# Patient Record
Sex: Male | Born: 1955 | Race: White | Hispanic: No | Smoking: Never smoker
Health system: Southern US, Community
[De-identification: ages and names within clinical notes are randomized; demographics above are authoritative.]

## PROBLEM LIST (undated history)

## (undated) DIAGNOSIS — K219 Gastro-esophageal reflux disease without esophagitis: Secondary | ICD-10-CM

## (undated) DIAGNOSIS — E119 Type 2 diabetes mellitus without complications: Secondary | ICD-10-CM

## (undated) DIAGNOSIS — I7100 Dissection of unspecified site of aorta: Secondary | ICD-10-CM

## (undated) DIAGNOSIS — K4090 Unilateral inguinal hernia, without obstruction or gangrene, not specified as recurrent: Secondary | ICD-10-CM

## (undated) DIAGNOSIS — J189 Pneumonia, unspecified organism: Secondary | ICD-10-CM

## (undated) DIAGNOSIS — D649 Anemia, unspecified: Secondary | ICD-10-CM

## (undated) DIAGNOSIS — C641 Malignant neoplasm of right kidney, except renal pelvis: Secondary | ICD-10-CM

## (undated) DIAGNOSIS — K2270 Barrett's esophagus without dysplasia: Secondary | ICD-10-CM

## (undated) DIAGNOSIS — Z9889 Other specified postprocedural states: Secondary | ICD-10-CM

## (undated) DIAGNOSIS — K439 Ventral hernia without obstruction or gangrene: Secondary | ICD-10-CM

## (undated) DIAGNOSIS — K429 Umbilical hernia without obstruction or gangrene: Secondary | ICD-10-CM

## (undated) DIAGNOSIS — I10 Essential (primary) hypertension: Secondary | ICD-10-CM

## (undated) DIAGNOSIS — G4733 Obstructive sleep apnea (adult) (pediatric): Secondary | ICD-10-CM

## (undated) DIAGNOSIS — N2889 Other specified disorders of kidney and ureter: Secondary | ICD-10-CM

## (undated) DIAGNOSIS — M62838 Other muscle spasm: Secondary | ICD-10-CM

## (undated) DIAGNOSIS — Z8719 Personal history of other diseases of the digestive system: Secondary | ICD-10-CM

## (undated) DIAGNOSIS — N280 Ischemia and infarction of kidney: Secondary | ICD-10-CM

## (undated) HISTORY — DX: Essential (primary) hypertension: I10

## (undated) HISTORY — PX: COLONOSCOPY: SHX174

## (undated) HISTORY — PX: PICC LINE INSERTION: CATH118290

## (undated) HISTORY — PX: HERNIA REPAIR: SHX51

## (undated) HISTORY — PX: TRANSURETHRAL RESECTION OF PROSTATE: SHX73

## (undated) HISTORY — DX: Type 2 diabetes mellitus without complications: E11.9

## (undated) HISTORY — PX: UPPER GASTROINTESTINAL ENDOSCOPY: SHX188

## (undated) HISTORY — DX: Barrett's esophagus without dysplasia: K22.70

## (undated) HISTORY — PX: TONSILLECTOMY: SUR1361

---

## 2007-01-22 ENCOUNTER — Ambulatory Visit (HOSPITAL_COMMUNITY): Admission: RE | Admit: 2007-01-22 | Discharge: 2007-01-22 | Payer: Self-pay | Admitting: Anesthesiology

## 2009-10-29 ENCOUNTER — Encounter: Admission: RE | Admit: 2009-10-29 | Discharge: 2009-10-29 | Payer: Self-pay | Admitting: Family Medicine

## 2010-06-11 ENCOUNTER — Other Ambulatory Visit: Payer: Self-pay | Admitting: Gastroenterology

## 2010-06-11 ENCOUNTER — Ambulatory Visit (HOSPITAL_COMMUNITY)
Admission: RE | Admit: 2010-06-11 | Discharge: 2010-06-11 | Disposition: A | Discharge status: Home / Self Care | Payer: BC Managed Care – PPO | Source: Ambulatory Visit | Attending: Gastroenterology | Admitting: Gastroenterology

## 2010-06-11 DIAGNOSIS — T18108A Unspecified foreign body in esophagus causing other injury, initial encounter: Secondary | ICD-10-CM | POA: Insufficient documentation

## 2010-06-11 DIAGNOSIS — Q391 Atresia of esophagus with tracheo-esophageal fistula: Secondary | ICD-10-CM | POA: Insufficient documentation

## 2010-06-11 DIAGNOSIS — IMO0002 Reserved for concepts with insufficient information to code with codable children: Secondary | ICD-10-CM | POA: Insufficient documentation

## 2010-06-11 DIAGNOSIS — K449 Diaphragmatic hernia without obstruction or gangrene: Secondary | ICD-10-CM | POA: Insufficient documentation

## 2010-06-16 NOTE — Op Note (Signed)
Jerry Weiss, Jerry Weiss NO.:  [id number]  MEDICAL RECORD NO.:  [id number]           PATIENT TYPE:  O  LOCATION:  [facility]                         FACILITY:  [facility]  PHYSICIAN:  Wonda Horner, M.D.   DATE OF BIRTH:  Mar 05, 1956  DATE OF PROCEDURE:  06/11/2010 DATE OF DISCHARGE:                              OPERATIVE REPORT   PROCEDURE:  Esophageal foreign body removal and upper endoscopy.  SURGEON:  Wonda Horner, MD  INDICATIONS FOR PROCEDURE:  The patient is a 55 year old male who was eating meat last night and got a piece of chicken stuck in his esophagus.  He has been unable to keep anything down since.  He called the office this morning and we directed him to the [facility] Endoscopy Department where he was seen.  Informed consent was obtained after explanation of the risks of bleeding, infection, and perforation.  PREMEDICATION:  Fentanyl 62.5 mcg IV and Versed 6 mg IV.  PROCEDURE:  With the patient in the left lateral decubitus position, the Pentax gastroscope was inserted into the oropharynx and passed into the esophagus.  Upon entering the esophagus, there were retained secretions which were suctioned.  The scope was advanced to the distal esophagus where a large meat impaction was noted in the distal esophagus.  A 4- prong grasper was advanced down the scope channel and the meat impaction was grabbed and it fragmented.  I then maneuvered around the fragmented portion and grabbed it again which fragmented the meat impaction again that was lodged in the distal esophagus.  With some further air insufflation and gentle pressure from the prong and scope, the meat impaction was then passed into the stomach.  The scope was then advanced down into the stomach.  No obvious lesions were seen.  It was retroflexed and no obvious lesions were seen in the fundus or cardia. The scope was then brought back.  There did appear to be a Schatzki ring at the level where  the impaction was and there was a small hiatal hernia.  With withdrawal of the scope, the esophagus had concentric rings that were seen in the distal portion and body of the stomach which made me wonder about the possibility of eosinophilic esophagitis and therefore a couple of biopsies were obtained from the distal and mid esophagus for histological inspection to look for eosinophilic esophagitis.  There was some irritation and erosion at the area of the EG junction where the meat impaction had been lodged.  He tolerated the procedure well without complications.  IMPRESSION: 1. Meat impaction, removed. 2. Hiatal hernia. 3. Schatzki ring. 4. Rule out eosinophilic esophagitis.  PLAN:  The patient will be discharged from the Endoscopy Unit.  The biopsies will be checked.  Further decisions will be made in regards to esophageal dilatation or treatment of eosinophilic esophagitis if that turns out to be positive on his biopsy.          ______________________________ Wonda Horner, M.D.     SFG/MEDQ  D:  06/11/2010  T:  06/12/2010  Job:  [id number]  Electronically Signed by Anson Fret MD  on 06/16/2010 04:39:15 PM

## 2010-07-15 ENCOUNTER — Ambulatory Visit (INDEPENDENT_AMBULATORY_CARE_PROVIDER_SITE_OTHER): Payer: BC Managed Care – PPO | Admitting: Pulmonary Disease

## 2010-07-15 ENCOUNTER — Encounter: Payer: Self-pay | Admitting: Pulmonary Disease

## 2010-07-15 VITALS — BP 156/102 | HR 93 | Temp 98.1°F | Ht 67.0 in | Wt 243.6 lb

## 2010-07-15 DIAGNOSIS — G4733 Obstructive sleep apnea (adult) (pediatric): Secondary | ICD-10-CM | POA: Insufficient documentation

## 2010-07-15 NOTE — Assessment & Plan Note (Signed)
The pt's history is very suspicious for clinically significant osa.  He is obese, has loud snoring/apneas with nonrestorative sleep, and has EDS.  I have had a long discussion with the pt about sleep apnea, including its impact on QOL and CV health.  At this point, he clearly needs a sleep study for diagnosis, and the pt is agreeable.

## 2010-07-15 NOTE — Patient Instructions (Signed)
Will get set up for sleep study.  Will arrange followup once results are available. Work on weight loss

## 2010-07-15 NOTE — Progress Notes (Signed)
  Subjective:    Patient ID: Jerry Weiss, male    DOB: 07/09/1955, 55 y.o.   MRN: [id number]  HPI The pt is a 55y/o male who I have been asked to see for possible osa.  His hx is significant for the following: -loud snoring, abnl breathing pattern during sleep, gasping arousals. -frequent awakenings at night with nonrestorative sleep.   -sleep pressure in the afternoon with inactivity, and takes a nap everyday. -dozing with tv and movies in evening if he does not take a nap -sleepiness with driving long distances only. -weight is up 13 pounds last 2 yrs, and epworth score 20.   Sleep Questionnaire: What time do you typically go to bed?( Between what hours) 10 pm to 11 pm How long does it take you to fall asleep? 2 minutes How many times during the night do you wake up? 7 What time do you get out of bed to start your day? 0700 Do you drive or operate heavy machinery in your occupation? No How much has your weight changed (up or down) over the past two years? (In pounds) 13 lb (5.897 kg) Have you ever had a sleep study before? No Do you currently use CPAP? No Do you wear oxygen at any time? No    Review of Systems  Constitutional: Negative for fever and unexpected weight change.  HENT: Negative for ear pain, nosebleeds, congestion, sore throat, rhinorrhea, sneezing, trouble swallowing, dental problem, postnasal drip and sinus pressure.   Eyes: Negative for redness and itching.  Respiratory: Negative for cough, chest tightness, shortness of breath and wheezing.   Cardiovascular: Negative for palpitations and leg swelling.  Gastrointestinal: Positive for abdominal pain. Negative for nausea and vomiting.  Genitourinary: Negative for dysuria.  Musculoskeletal: Negative for joint swelling.  Skin: Negative for rash.  Neurological: Negative for headaches.  Hematological: Does not bruise/bleed easily.  Psychiatric/Behavioral: Negative for dysphoric mood. The patient is not nervous/anxious.       Objective:   Physical Exam Constitutional: obese male , no acute distress  HENT:  Nares patent without discharge  Oropharynx without exudate, palate and uvula are elongated, large tongue  Eyes:  Perrla, eomi, no scleral icterus  Neck:  No JVD, no TMG  Cardiovascular:  Normal rate, regular rhythm, no rubs or gallops.  No murmurs        Intact distal pulses  Pulmonary :  Normal breath sounds, no stridor or respiratory distress   No rales, rhonchi, or wheezing  Abdominal:  Soft, nondistended, bowel sounds present.  No tenderness noted.   Musculoskeletal:  No lower extremity edema noted.  Lymph Nodes:  No cervical lymphadenopathy noted  Skin:  No cyanosis noted  Neurologic:  Alert, appropriate, moves all 4 extremities without obvious deficit.         Assessment & Plan:

## 2010-07-17 ENCOUNTER — Ambulatory Visit (HOSPITAL_BASED_OUTPATIENT_CLINIC_OR_DEPARTMENT_OTHER): Payer: BC Managed Care – PPO | Attending: Pulmonary Disease

## 2010-07-17 DIAGNOSIS — G4733 Obstructive sleep apnea (adult) (pediatric): Secondary | ICD-10-CM | POA: Insufficient documentation

## 2010-07-17 DIAGNOSIS — I4949 Other premature depolarization: Secondary | ICD-10-CM | POA: Insufficient documentation

## 2010-07-19 ENCOUNTER — Encounter: Payer: Self-pay | Admitting: Pulmonary Disease

## 2010-07-28 NOTE — Op Note (Signed)
Jerry Weiss, Jerry Weiss                ACCOUNT NO.:  [id number]   MEDICAL RECORD NO.:  [id number]          PATIENT TYPE:  OUT   LOCATION:  ENDO                         FACILITY:  [facility]   PHYSICIAN:  Ronald Lobo, M.D.   DATE OF BIRTH:  [birth year]   DATE OF PROCEDURE:  01/22/2007  DATE OF DISCHARGE:                               OPERATIVE REPORT   PROCEDURE:  Upper endoscopy with removal of esophageal foreign body  (food impaction).   INDICATIONS:  55 year old gentleman with intermittent dysphagia symptoms  who developed a food impaction on a piece of steak yesterday.  The bolus  of food has not moved, and he is unable to swallow over the past roughly  16 hours.   FINDINGS:  Successful removal of food impaction.  Schatzki's ring with  stasis esophagitis above small hiatal hernia.   PROCEDURE:  The nature, purpose, risks of the procedure been discussed  with the patient who provided written consent.  He came over to the  hospital from the [facility] where he had been seen this  afternoon and came through admitting directly to the endoscopy unit.  Topical pharyngeal anesthesia was administered followed by intravenous  sedation with fentanyl 50 mcg and Versed 7 mg IV prior to and during the  course of the procedure, without clinical instability to a level of mild  sedation.   The Pentax video endoscope was passed under direct vision.  The vocal  cords looked normal.  The esophagus was readily entered.  The proximal  esophagus was normal; distally, there was impacted food present.  The  polypectomy snare was used to pull out two or three pieces, and then we  used the tripod forceps to pull out further pieces and finally the snare  again to get out the remaining pieces.  One small-to-medium sized piece  actually migrated down the esophagus into the stomach thereafter.  Total  time working on the food impaction was probably about 30 minutes.   At the GE junction, there was a  well-defined esophageal mucosal ring  (Schatzki's ring) without any evidence of mass or malignancy, but there  was some stasis esophagitis right at the GE junction.  Some of this  could reflect reflux esophagitis as well.  There were no linear streaks  of classic reflux esophagitis migrating up the esophagus, however.   A 2-cm hiatal hernia was present.  The stomach was entered.  It  contained a small clear residual but had normal mucosa without evidence  of gastritis, erosions, ulcers, polyps or masses, including a  retroflexed view of the cardia of the stomach, and the pylorus, duodenal  bulb, and second duodenum looked normal.   The scope was removed from the patient.  He tolerated the procedure  well, and there were no apparent complications.   IMPRESSION:  1. Successful removal of esophageal foreign body, as described above.  2. Small hiatal hernia with esophageal mucosal ring (Schatzki's ring),      the latter accounting for the problem with the food impaction.  3. Stasis esophagitis at the gastroesophageal junction.   PLAN:  1. Initiate PPI therapy with over-the-counter omeprazole 20 mg once      daily.  2. I have asked the patient to follow up with me in the office in the      next 3-6 weeks to discuss doing an elective esophageal dilatation      and a concurrent screening colonoscopy.           ______________________________  Ronald Lobo, M.D.     RB/MEDQ  D:  01/22/2007  T:  01/23/2007  Job:  [id number]   cc:   C. Melinda Crutch, M.D.

## 2010-08-05 ENCOUNTER — Telehealth: Payer: Self-pay | Admitting: Pulmonary Disease

## 2010-08-05 NOTE — Telephone Encounter (Signed)
Called, spoke with pt.  States he had sleep study done on 07/17/10 and would like to know if results are ready yet.  Dr. Gwenette Greet, pls advise.  Thanks!

## 2010-08-06 ENCOUNTER — Encounter: Payer: Self-pay | Admitting: Pulmonary Disease

## 2010-08-06 ENCOUNTER — Ambulatory Visit (INDEPENDENT_AMBULATORY_CARE_PROVIDER_SITE_OTHER): Payer: BC Managed Care – PPO | Admitting: Pulmonary Disease

## 2010-08-06 VITALS — BP 164/108 | HR 95 | Temp 97.7°F | Ht 67.0 in | Wt 243.2 lb

## 2010-08-06 DIAGNOSIS — G4733 Obstructive sleep apnea (adult) (pediatric): Secondary | ICD-10-CM

## 2010-08-06 DIAGNOSIS — I4949 Other premature depolarization: Secondary | ICD-10-CM

## 2010-08-06 NOTE — Progress Notes (Signed)
  Subjective:    Patient ID: Jerry Weiss, male    DOB: Dec 18, 1955, 55 y.o.   MRN: [id number]  HPI The pt comes in today for f/u of his recent sleep study.  He was found to have severe osa, with AHI 74/hr and significant desaturation.  I have reviewed the study in detail with him, and answered all of his questions.    Review of Systems  Constitutional: Negative for fever and unexpected weight change.  HENT: Positive for congestion and sneezing. Negative for ear pain, nosebleeds, sore throat, rhinorrhea, trouble swallowing, dental problem, postnasal drip and sinus pressure.   Eyes: Positive for redness and itching.  Respiratory: Negative for cough, chest tightness, shortness of breath and wheezing.   Cardiovascular: Negative for palpitations and leg swelling.  Gastrointestinal: Negative for nausea and vomiting.  Genitourinary: Negative for dysuria.  Musculoskeletal: Negative for joint swelling.  Skin: Negative for rash.  Neurological: Negative for headaches.  Hematological: Does not bruise/bleed easily.  Psychiatric/Behavioral: Negative for dysphoric mood. The patient is not nervous/anxious.        Objective:   Physical Exam Obese male in nad No purulence or discharge from nares LE without edema, no cyanosis Awake, but appears sleepy, moves all 4        Assessment & Plan:

## 2010-08-06 NOTE — Patient Instructions (Signed)
Will start on cpap at moderate level.  Please call if having issues with tolerance Work on weight loss followup with me in 5 weeks.

## 2010-08-06 NOTE — Procedures (Signed)
Jerry Weiss, Jerry Weiss                ACCOUNT NO.:  [id number]  MEDICAL RECORD NO.:  [id number]          PATIENT TYPE:  OUT  LOCATION:  SLEEP CENTER                 [facility]ACILITY[facility]  [facility]  PHYSICIAN:  Kathee Delton, MD,FCCPDATE OF BIRTH:  1956-03-07  DATE OF STUDY:  07/17/2010                           NOCTURNAL POLYSOMNOGRAM  REFERRING PHYSICIAN:  Keon Pender M Olyver Hawes  INDICATIONS FOR STUDY:  Hypersomnia with sleep apnea.  EPWORTH SCORE:  20.Marland Kitchen  SLEEP ARCHITECTURE:  The patient had total sleep time of 259 minutes with no slow wave sleep or REM.  Sleep onset latency was normal at 5 minutes and sleep efficiency was poor at 67%.  RESPIRATORY DATA:  The patient was found to have 60 obstructive apneas and 270 obstructive hypopneas, giving him an AHI of 76 events per hour. The events occurred in all body positions and there was loud snoring noted throughout.  OXYGEN DATA:  There was O2 desaturation as low as 65% with the patient's obstructive events.  CARDIAC DATA:  PVCs noted throughout.  MOVEMENT/PARASOMNIA:  The patient had no significant leg jerks or other abnormal behaviors noted.  IMPRESSION/RECOMMENDATIONS: 1. Severe obstructive sleep apnea/hypopnea syndrome with an AHI of 76     events per hour and O2 desaturation as low as 65%.  Treatment for     this degree of sleep apnea should focus primarily on CPAP and     concomitant weight loss. 2. PVCs noted throughout.     Kathee Delton, MD,FCCP [place], [place] Board of Sleep Medicine Electronically Signed    [facility]/MEDQ  D:  08/06/2010 07:39:03  T:  08/06/2010 81:77:11  Job:  [id number]

## 2010-08-06 NOTE — Telephone Encounter (Signed)
Sleep study has been read per [facility].   Called and spoke with Jerry Weiss.  Scheduled him for a f/u today with [place] at 3:45pm to discuss these results.

## 2010-08-06 NOTE — Assessment & Plan Note (Signed)
The pt has severe osa by his sleep study, and will need treatment with cpap while working on weight loss.  The pt is agreeable.  I will set the patient up on cpap at a moderate pressure level to allow for desensitization, and will troubleshoot the device over the next 4-6weeks if needed.  The pt is to call me if having issues with tolerance.  Will then optimize the pressure once patient is able to wear cpap on a consistent basis.

## 2010-08-09 ENCOUNTER — Encounter (HOSPITAL_BASED_OUTPATIENT_CLINIC_OR_DEPARTMENT_OTHER): Payer: BC Managed Care – PPO

## 2010-08-24 ENCOUNTER — Encounter: Payer: Self-pay | Admitting: Pulmonary Disease

## 2010-08-25 ENCOUNTER — Telehealth: Payer: Self-pay | Admitting: Pulmonary Disease

## 2010-08-25 DIAGNOSIS — G4733 Obstructive sleep apnea (adult) (pediatric): Secondary | ICD-10-CM

## 2010-08-25 NOTE — Telephone Encounter (Signed)
Called and spoke with pt.  Pt aware of CY's recs to increase cpap to 12cwp.  Pt verbalized understanding and stated he will call back if he has any further problems with his cpap machine.  Order sent to [facility].

## 2010-08-25 NOTE — Telephone Encounter (Signed)
Called and spoke with pt.  Pt last saw [place] on 08/06/2010 and was started on cpap.  He has his 5 week f/u appt scheduled for 09/10/10 but pt is calling today c/o problems with pressure on his machine.  Pt states the first several nights on his cpap machine were "great."  However, pt states recently it feels like the pressure isn't enough and needs to be increased.  Pt wonders if he may have gotten "aquainted with the cpap machine and current pressure" and therefore needs it increased.  Pt states his wife has also noticed pt "lightly snoring" now while using his machine.  Pt denies any air leaks.  States he wears his machine every night for approx 6 to 7 hours per night.  [place] out of office all week.  Will forward message to Cy in Kc's absence.    FYI:  Per pt's records under the referral tab, looks like [place] ordered for CPAP to be set at 10. Order was sent to [facility].

## 2010-08-25 NOTE — Telephone Encounter (Signed)
Per CY-Increase CPAP to 12 cwp

## 2010-09-10 ENCOUNTER — Encounter: Payer: Self-pay | Admitting: Pulmonary Disease

## 2010-09-10 ENCOUNTER — Ambulatory Visit (INDEPENDENT_AMBULATORY_CARE_PROVIDER_SITE_OTHER): Payer: BC Managed Care – PPO | Admitting: Pulmonary Disease

## 2010-09-10 VITALS — BP 162/90 | HR 88 | Temp 98.1°F | Ht 67.0 in | Wt 248.2 lb

## 2010-09-10 DIAGNOSIS — G4733 Obstructive sleep apnea (adult) (pediatric): Secondary | ICD-10-CM

## 2010-09-10 NOTE — Progress Notes (Signed)
  Subjective:    Patient ID: Jerry Weiss, male    DOB: [birth year], 55 y.o.   MRN: [id number]  HPI The pt comes in today for f/u of his known osa.  He has been wearing cpap compliantly, and denies any issues with mask.  He did have issues with pressure being too low, but has done better on 12cm.  He feels that his sleep is much better, and his daytime alertness is improved as well.  His pressure has not been optimized as of yet.    Review of Systems  Constitutional: Negative for fever and unexpected weight change.  HENT: Negative for ear pain, nosebleeds, congestion, sore throat, rhinorrhea, sneezing, trouble swallowing, dental problem, postnasal drip and sinus pressure.   Eyes: Negative for redness and itching.  Respiratory: Negative for cough, chest tightness, shortness of breath and wheezing.   Cardiovascular: Negative for palpitations and leg swelling.  Gastrointestinal: Negative for nausea and vomiting.  Genitourinary: Negative for dysuria.  Musculoskeletal: Negative for joint swelling.  Skin: Negative for rash.  Neurological: Negative for headaches.  Hematological: Does not bruise/bleed easily.  Psychiatric/Behavioral: Negative for dysphoric mood. The patient is not nervous/anxious.        Objective:   Physical Exam Obese male in nad No skin breakdown or pressure necrosis from cpap mask.  No purulence or discharge Chest clear Cor with rrr LE without edema, no cyanosis Alert, does not appear sleepy, moves all 4        Assessment & Plan:

## 2010-09-10 NOTE — Patient Instructions (Signed)
Will put your cpap machine on auto for the next 2 weeks to optimize your pressure.  Will let you know the results. Work on weight loss. If doing well, see me back in 55mo.

## 2010-09-10 NOTE — Assessment & Plan Note (Signed)
The pt is wearing cpap compliantly, but we need to optimize his pressure.  He did not do well on 10cm, but better on 12cm.  Will need to be on auto for a period of time to help [place] with this.  I have encouraged the pt to work on weight loss, and to keep up with cpap supplies. Care Plan:  At this point, will arrange for the patient's machine to be changed over to auto mode for 2 weeks to optimize their pressure.  I will review the downloaded data once sent by dme, and also evaluate for compliance, leaks, and residual osa.  I will call the patient and dme to discuss the results, and have the patient's machine set appropriately.  This will serve as the pt's cpap pressure titration.

## 2010-09-18 ENCOUNTER — Encounter: Payer: Self-pay | Admitting: Pulmonary Disease

## 2011-02-15 ENCOUNTER — Other Ambulatory Visit: Payer: Self-pay | Admitting: Gastroenterology

## 2011-03-12 ENCOUNTER — Ambulatory Visit: Payer: BC Managed Care – PPO | Admitting: Pulmonary Disease

## 2011-03-15 ENCOUNTER — Telehealth: Payer: Self-pay | Admitting: Pulmonary Disease

## 2011-03-15 ENCOUNTER — Ambulatory Visit: Payer: BC Managed Care – PPO | Admitting: Pulmonary Disease

## 2011-03-15 NOTE — Telephone Encounter (Signed)
Called spoke with patient who stated that he thought he was supposed to have another CPAP download in Dec and that this has not yet been done, therfore he cancelled his appt today with [place].  No documentation of another download needed in pt's chart.    Called [facility], spoke with Ivin Booty who stated that they faxed over his 08/2010 download to the triage fax machine this morning but they have no other documentation of another download order.  Called spoke with patient again who apologized for the misunderstanding.  Advised pt that per his last ov patient instructions he was to have the 1 download and follow up here in 6 months, hence the appt today.  Pt apologized again and stated that he has been doing very well with his CPAP on his current settings.  Pt unable to come in for appt d/t family and trips out of town until Feb.  appt with [facility] scheduled 2.1.13 @ 1045.  Will forward to [facility] to make him aware.

## 2011-03-15 NOTE — Telephone Encounter (Signed)
Megan, please get results to [facility], thanks!

## 2011-03-15 NOTE — Telephone Encounter (Signed)
He was supposed to be on auto for 2 weeks with download to me.  This was ordered in June, and I do not see where we have ever received this.  Need to get from dme, and pt still needs to see me regardless of the download for his 49mo f/u.

## 2011-03-17 NOTE — Telephone Encounter (Signed)
That order was sent 09/10/10, and verified in the computer.  Will just re-order when he comes in for visit.  Please let pt know what is going on.  Thanks.

## 2011-03-17 NOTE — Telephone Encounter (Signed)
Received download from [facility] from June but this isn't an Development worker, international aid that was requested by [facility] (these results are in [place] folder) .  Called and spoke with Ivin Booty. She states she never got request for auto download that [facility] had requested back in June.  [place], do you want to go ahead and order auto download now or wait until pt comes in for his routine f/u appt next Monday 03/22/11.

## 2011-03-17 NOTE — Telephone Encounter (Signed)
Pt informed of KC's recs and states is okay with same and will discuss at upcoming appointment on 04/16/11

## 2011-03-22 ENCOUNTER — Ambulatory Visit: Payer: BC Managed Care – PPO | Admitting: Pulmonary Disease

## 2011-04-16 ENCOUNTER — Ambulatory Visit (INDEPENDENT_AMBULATORY_CARE_PROVIDER_SITE_OTHER): Payer: BC Managed Care – PPO | Admitting: Pulmonary Disease

## 2011-04-16 ENCOUNTER — Encounter: Payer: Self-pay | Admitting: Pulmonary Disease

## 2011-04-16 VITALS — BP 158/92 | HR 80 | Temp 98.5°F | Ht 67.0 in | Wt 251.6 lb

## 2011-04-16 DIAGNOSIS — G4733 Obstructive sleep apnea (adult) (pediatric): Secondary | ICD-10-CM

## 2011-04-16 NOTE — Assessment & Plan Note (Signed)
The patient is doing very well on CPAP with the automatic setting.  He feels that he sleeps well, and denies any daytime sleepiness.  He is having no issues with his CPAP mask or pressure currently, and is very satisfied.  I have asked him to continue on his current setting, and to keep up with the mask changes and supplies.  I have also encouraged him to work aggressively on weight loss.

## 2011-04-16 NOTE — Progress Notes (Signed)
  Subjective:    Patient ID: Jerry Weiss, male    DOB: 11-25-1955, 56 y.o.   MRN: [id number]  HPI The patient comes in today for followup of his known sleep apnea.  He has been wearing CPAP on the automatic setting for quite a while, and feels much more comfortable on this setting.  I have received a download for the last 7 months, and it shows a 99% compliance at more than 4 hours.  His optimal pressure from the office setting appears to be 14 cm.  The patient denies any issues with his mask or pressure.   Review of Systems  Constitutional: Negative for fever and unexpected weight change.  HENT: Positive for congestion, rhinorrhea and postnasal drip. Negative for ear pain, nosebleeds, sore throat, sneezing, trouble swallowing, dental problem and sinus pressure.   Eyes: Positive for redness and itching.  Respiratory: Positive for cough. Negative for chest tightness, shortness of breath and wheezing.   Cardiovascular: Negative for palpitations and leg swelling.  Gastrointestinal: Negative for nausea and vomiting.  Genitourinary: Negative for dysuria.  Musculoskeletal: Negative for joint swelling.  Skin: Negative for rash.  Neurological: Positive for headaches.  Hematological: Does not bruise/bleed easily.  Psychiatric/Behavioral: Negative for dysphoric mood. The patient is not nervous/anxious.        Objective:   Physical Exam Obese male in no acute distress No skin breakdown or pressure necrosis from the CPAP mask Lower extremities with mild edema, no cyanosis Alert and oriented, moves all 4 extremities, does not appear to be sleepy.       Assessment & Plan:

## 2011-04-16 NOTE — Patient Instructions (Signed)
No change in cpap.  Will leave on auto setting since you are doing well. Work on weight loss followup with me in one year if doing well.

## 2012-04-17 ENCOUNTER — Ambulatory Visit: Payer: BC Managed Care – PPO | Admitting: Pulmonary Disease

## 2014-09-06 ENCOUNTER — Other Ambulatory Visit: Payer: Self-pay | Admitting: Family Medicine

## 2014-09-06 ENCOUNTER — Ambulatory Visit
Admission: RE | Admit: 2014-09-06 | Discharge: 2014-09-06 | Disposition: A | Discharge status: Home / Self Care | Payer: Self-pay | Source: Ambulatory Visit | Attending: Family Medicine | Admitting: Family Medicine

## 2014-09-06 DIAGNOSIS — R52 Pain, unspecified: Secondary | ICD-10-CM

## 2014-09-06 DIAGNOSIS — R609 Edema, unspecified: Secondary | ICD-10-CM

## 2015-09-02 DIAGNOSIS — N401 Enlarged prostate with lower urinary tract symptoms: Secondary | ICD-10-CM | POA: Diagnosis not present

## 2015-09-06 ENCOUNTER — Other Ambulatory Visit (HOSPITAL_COMMUNITY): Payer: Self-pay | Admitting: Family Medicine

## 2015-09-06 DIAGNOSIS — G8929 Other chronic pain: Secondary | ICD-10-CM

## 2015-09-06 DIAGNOSIS — M25561 Pain in right knee: Principal | ICD-10-CM

## 2015-09-06 NOTE — Progress Notes (Unsigned)
Acute flare of chronic pain. Worse than typical flare. Affecting ambulation and occasional catching  Jerry Darner, MD Family Medicine 09/06/2015, 1:04 PM

## 2015-09-08 DIAGNOSIS — R3915 Urgency of urination: Secondary | ICD-10-CM | POA: Diagnosis not present

## 2015-09-08 DIAGNOSIS — N401 Enlarged prostate with lower urinary tract symptoms: Secondary | ICD-10-CM | POA: Diagnosis not present

## 2015-10-21 ENCOUNTER — Ambulatory Visit
Admission: RE | Admit: 2015-10-21 | Discharge: 2015-10-21 | Disposition: A | Discharge status: Home / Self Care | Payer: BLUE CROSS/BLUE SHIELD | Source: Ambulatory Visit | Attending: Family Medicine | Admitting: Family Medicine

## 2015-10-21 DIAGNOSIS — G8929 Other chronic pain: Secondary | ICD-10-CM

## 2015-10-21 DIAGNOSIS — M25561 Pain in right knee: Principal | ICD-10-CM

## 2015-10-21 DIAGNOSIS — M179 Osteoarthritis of knee, unspecified: Secondary | ICD-10-CM | POA: Diagnosis not present

## 2016-06-02 ENCOUNTER — Encounter: Payer: Self-pay | Admitting: Gastroenterology

## 2016-06-21 ENCOUNTER — Ambulatory Visit: Payer: BLUE CROSS/BLUE SHIELD | Admitting: Gastroenterology

## 2016-07-26 ENCOUNTER — Ambulatory Visit (INDEPENDENT_AMBULATORY_CARE_PROVIDER_SITE_OTHER): Payer: BLUE CROSS/BLUE SHIELD | Admitting: Gastroenterology

## 2016-07-26 ENCOUNTER — Other Ambulatory Visit (INDEPENDENT_AMBULATORY_CARE_PROVIDER_SITE_OTHER): Payer: BLUE CROSS/BLUE SHIELD

## 2016-07-26 ENCOUNTER — Encounter: Payer: Self-pay | Admitting: Gastroenterology

## 2016-07-26 VITALS — BP 160/78 | HR 72 | Ht 67.0 in | Wt 229.0 lb

## 2016-07-26 DIAGNOSIS — R140 Abdominal distension (gaseous): Secondary | ICD-10-CM

## 2016-07-26 DIAGNOSIS — R109 Unspecified abdominal pain: Secondary | ICD-10-CM | POA: Diagnosis not present

## 2016-07-26 DIAGNOSIS — K219 Gastro-esophageal reflux disease without esophagitis: Secondary | ICD-10-CM | POA: Diagnosis not present

## 2016-07-26 LAB — IGA: IgA: 407 mg/dL — ABNORMAL HIGH (ref 68–378)

## 2016-07-26 NOTE — Progress Notes (Signed)
HPI :  61 y/o male with a history of GERD, HTN, BPH, here for a new patient evaluation for GERD / bloating  Symptoms bothering him since October 2017 more so than usual. He reported acute illness causing vomiting one night, and then has had abdominal bloating / gas which has been bothering him since that time. He's had a history of reflux in the past intermittently, treated with prilosec which has helped, but not helping his current symptoms.   He reports he's had some intermittent gas production, or trapped gas in his chest and lower abdomen. He has felt periodic discomofrt in his chest relieved with belching. He reports he's had 2 foods impactions in the past. He denies any dysphagia at all at this time, previously he did with large bites of food. He does endorse some heartburn which bothers him at time. He thinks fairly stable in recent months. He avoids eating meat. He's had some periodic nausea, but no vomiting. No changes in bowel habits, Fairly regular bowel movements 3 times per day. NO blood in the stools. No FH of colon cancer.   If he eats late at night he feels worse.  He takes protonix 4m BID, with some occasional breakthrough / reflux.    CT scan 2011 with some thickening of the esophagus.  [facility], told to come back in 10 years.   Past Medical History:  Diagnosis Date  . Hypertension   BPH GERD Seasonal allergies   Past Surgical History:  Procedure Laterality Date  . UPPER GASTROINTESTINAL ENDOSCOPY    hernia surgery tonsillectomy  Family History  Problem Relation Age of Onset  . Allergies Mother   . Diabetes Mother   . Stroke Mother   . Skin cancer Mother   . Allergies Sister   . Asthma Daughter        childhood  . Asthma Father        developed a lot of medical problems after a fall  . Colon cancer Neg Hx   . Stomach cancer Neg Hx   . Rectal cancer Neg Hx   . Liver cancer Neg Hx   . Esophageal cancer Neg Hx    Social  History  Substance Use Topics  . Smoking status: Never Smoker  . Smokeless tobacco: Never Used  . Alcohol use No   Current Outpatient Prescriptions  Medication Sig Dispense Refill  . alfuzosin (UROXATRAL) 10 MG 24 hr tablet daily.  0  . amLODipine-benazepril (LOTREL) 5-40 MG capsule daily.  0  . cetirizine (ZYRTEC) 10 MG tablet Take 10 mg by mouth daily as needed.     . fluticasone (FLONASE) 50 MCG/ACT nasal spray Place 2 sprays into the nose daily as needed.     .Marland KitchenGLUCOSAMINE-CHONDROITIN PO daily.     . Multiple Vitamin (MULTIVITAMIN) capsule Take 1 capsule by mouth daily.      . NON FORMULARY as needed. Eye drops for allergies.    . pantoprazole (PROTONIX) 20 MG tablet 2 (two) times daily.  0   No current facility-administered medications for this visit.    Allergies  Allergen Reactions  . Codeine   . Shellfish-Derived Products      Review of Systems: All systems reviewed and negative except where noted in HPI.   No results found for: WBC, HGB, HCT, MCV, PLT  No results found for: CREATININE, BUN, NA, K, CL, CO2  No results found for: ALT, AST, GGT, ALKPHOS, BILITOT  Physical Exam: BP (!) 160/78   Pulse 72   Ht 5' 7"  (1.702 m)   Wt 229 lb (103.9 kg)   BMI 35.87 kg/m  Constitutional: Pleasant,well-developed, male in no acute distress. HEENT: Normocephalic and atraumatic. Conjunctivae are normal. No scleral icterus. Neck supple.  Cardiovascular: Normal rate, regular rhythm.  Pulmonary/chest: Effort normal and breath sounds normal. No wheezing, rales or rhonchi. Abdominal: Soft, protuberant / obese abdomen, nontender.  There are no masses palpable. No hepatomegaly. Extremities: no edema Lymphadenopathy: No cervical adenopathy noted. Neurological: Alert and oriented to person place and time. Skin: Skin is warm and dry. No rashes noted. Psychiatric: Normal mood and affect. Behavior is normal.   ASSESSMENT AND PLAN: 61 year old male here for new patient  evaluation discussed following:  GERD - intermittent symptoms over the years, fairly well controlled with Protonix at this time although some occasional breakthrough. He has had a history of dysphagia with large bites of food, not eating meat to avoid symptoms. History of impactions 2 in the past, noted to have Schatzki ring with suspected hiatal hernia on the last exam. In light of other symptoms as below and recurrent belching, I offered him an EGD to further evaluate. Following discussion risks and benefits he wished proceed.  Bloating - discuss potential causes of this with him, perhaps related to dietary intake. Recommend a trial of low FODMAP diet and will add trial of VSL#3 (sample given) to see if this helps. Will also check celiac serologies to ensure negative. Will await EGD as above although don't think likely to show cause for this symptom.   Patient in agreement with plan. Of note, will obtain records from prior colonoscopy to ensure up to date.   Colony Cellar, MD [facility] Pager [phone]  CC: [name], MD

## 2016-07-26 NOTE — Patient Instructions (Signed)
If you are age 61 or older, your body mass index should be between 23-30. Your Body mass index is 35.87 kg/m. If this is out of the aforementioned range listed, please consider follow up with your Primary Care Provider.  If you are age 91 or younger, your body mass index should be between 19-25. Your Body mass index is 35.87 kg/m. If this is out of the aformentioned range listed, please consider follow up with your Primary Care Provider.   Your physician has requested that you go to the basement for the following lab work before leaving today:  TTG, IGA  You have been scheduled for an endoscopy. Please follow written instructions given to you at your visit today. If you use inhalers (even only as needed), please bring them with you on the day of your procedure. Your physician has requested that you go to www.startemmi.com and enter the access code given to you at your visit today. This web site gives a general overview about your procedure. However, you should still follow specific instructions given to you by our office regarding your preparation for the procedure.  You have been given a Low FodMap diet to follow.  Thank you.

## 2016-07-29 LAB — TISSUE TRANSGLUTAMINASE ABS,IGG,IGA
TISSUE TRANSGLUT AB: 4 U/mL (ref <6)
TISSUE TRANSGLUTAMINASE AB, IGA: 2 U/mL (ref <4)

## 2016-08-04 ENCOUNTER — Encounter: Payer: Self-pay | Admitting: Gastroenterology

## 2016-08-18 ENCOUNTER — Ambulatory Visit (AMBULATORY_SURGERY_CENTER): Payer: BLUE CROSS/BLUE SHIELD | Admitting: Gastroenterology

## 2016-08-18 ENCOUNTER — Encounter: Payer: Self-pay | Admitting: Gastroenterology

## 2016-08-18 VITALS — BP 199/88 | HR 68 | Temp 99.8°F | Resp 14 | Ht 67.0 in | Wt 229.0 lb

## 2016-08-18 DIAGNOSIS — K219 Gastro-esophageal reflux disease without esophagitis: Secondary | ICD-10-CM | POA: Diagnosis not present

## 2016-08-18 DIAGNOSIS — K222 Esophageal obstruction: Secondary | ICD-10-CM | POA: Diagnosis not present

## 2016-08-18 DIAGNOSIS — K2270 Barrett's esophagus without dysplasia: Secondary | ICD-10-CM | POA: Diagnosis not present

## 2016-08-18 DIAGNOSIS — R1310 Dysphagia, unspecified: Secondary | ICD-10-CM | POA: Diagnosis present

## 2016-08-18 DIAGNOSIS — K2900 Acute gastritis without bleeding: Secondary | ICD-10-CM

## 2016-08-18 MED ORDER — SODIUM CHLORIDE 0.9 % IV SOLN: 500.0000 mL | INTRAVENOUS | Status: DC

## 2016-08-18 MED ORDER — VSL#3 PO CAPS: 1.0000 | ORAL_CAPSULE | Freq: Two times a day (BID) | ORAL | 11 refills | Status: DC

## 2016-08-18 NOTE — Progress Notes (Signed)
VSS ALERT AND ORIENTED X 3. SATISFIED WITH ANES. REPORT TO Opal Sidles RN

## 2016-08-18 NOTE — Progress Notes (Signed)
Called to room to assist during endoscopic procedure.  Patient ID and intended procedure confirmed with present staff. Received instructions for my participation in the procedure from the performing physician.  

## 2016-08-18 NOTE — Patient Instructions (Signed)
YOU HAD AN ENDOSCOPIC PROCEDURE TODAY AT [facility]:   Refer to the procedure report that was given to you for any specific questions about what was found during the examination.  If the procedure report does not answer your questions, please call your gastroenterologist to clarify.  If you requested that your care partner not be given the details of your procedure findings, then the procedure report has been included in a sealed envelope for you to review at your convenience later.  YOU SHOULD EXPECT: Some feelings of bloating in the abdomen. Passage of more gas than usual.  Walking can help get rid of the air that was put into your GI tract during the procedure and reduce the bloating. If you had a lower endoscopy (such as a colonoscopy or flexible sigmoidoscopy) you may notice spotting of blood in your stool or on the toilet paper. If you underwent a bowel prep for your procedure, you may not have a normal bowel movement for a few days.  Please Note:  You might notice some irritation and congestion in your nose or some drainage.  This is from the oxygen used during your procedure.  There is no need for concern and it should clear up in a day or so.  SYMPTOMS TO REPORT IMMEDIATELY:    Following upper endoscopy (EGD)  Vomiting of blood or coffee ground material  New chest pain or pain under the shoulder blades  Painful or persistently difficult swallowing  New shortness of breath  Fever of 100F or higher  Black, tarry-looking stools  For urgent or emergent issues, a gastroenterologist can be reached at any hour by calling [phone].   DIET:  We do recommend a small meal at first, but then you may proceed to your regular diet.  Drink plenty of fluids but you should avoid alcoholic beverages for 24 hours.  ACTIVITY:  You should plan to take it easy for the rest of today and you should NOT DRIVE or use heavy machinery until tomorrow (because of the sedation medicines used  during the test).    FOLLOW UP: Our staff will call the number listed on your records the next business day following your procedure to check on you and address any questions or concerns that you may have regarding the information given to you following your procedure. If we do not reach you, we will leave a message.  However, if you are feeling well and you are not experiencing any problems, there is no need to return our call.  We will assume that you have returned to your regular daily activities without incident.  If any biopsies were taken you will be contacted by phone or by letter within the next 1-3 weeks.  Please call us at [phone] if you have not heard about the biopsies in 3 weeks.    SIGNATURES/CONFIDENTIALITY: You and/or your care partner have signed paperwork which will be entered into your electronic medical record.  These signatures attest to the fact that that the information above on your After Visit Summary has been reviewed and is understood.  Full responsibility of the confidentiality of this discharge information lies with you and/or your care-partner.  After dilation diet given with instructions.  Hiatal hernia information given.  Gastritis information given.  Esophageal stenosis information given.  No NSAIDs if possible.

## 2016-08-18 NOTE — Op Note (Signed)
[facility] Patient Name: Jerry Weiss Procedure Date: 08/18/2016 10:10 AM MRN: [id number] Endoscopist: Remo Lipps P. Rhyder Koegel MD, MD Age: 61 Referring MD:  Date of Birth: 19-Feb-1956 Gender: Male Account #: [id number] Procedure:                Upper GI endoscopy Indications:              rare dysphagia (history of food impaction                            remotely), Heartburn, bloating Medicines:                Monitored Anesthesia Care Procedure:                Pre-Anesthesia Assessment:                           - Prior to the procedure, a History and Physical                            was performed, and patient medications and                            allergies were reviewed. The patient's tolerance of                            previous anesthesia was also reviewed. The risks                            and benefits of the procedure and the sedation                            options and risks were discussed with the patient.                            All questions were answered, and informed consent                            was obtained. Prior Anticoagulants: The patient has                            taken no previous anticoagulant or antiplatelet                            agents. ASA Grade Assessment: II - A patient with                            mild systemic disease. After reviewing the risks                            and benefits, the patient was deemed in                            satisfactory condition to undergo the procedure.  After obtaining informed consent, the endoscope was                            passed under direct vision. Throughout the                            procedure, the patient's blood pressure, pulse, and                            oxygen saturations were monitored continuously. The                            Endoscope was introduced through the mouth, and                            advanced to the second part of  duodenum. The upper                            GI endoscopy was accomplished without difficulty.                            The patient tolerated the procedure well. Scope In: Scope Out: Findings:                 Esophagogastric landmarks were identified: the                            Z-line was found at 38 cm, the gastroesophageal                            junction was found at 39 cm and the upper extent of                            the gastric folds was found at 41 cm from the                            incisors.                           A 2 cm hiatal hernia was present.                           The Z-line was irregular with a roughly 1cm tongue                            of salmon colored mucosa without nodularity.                            Biopsies were taken with a cold forceps for                            histology.                           One mild  benign-appearing, intrinsic stenosis was                            found 39 cm from the incisors. This measured less                            than one cm (in length) and was traversed. A TTS                            dilator was passed through the scope. Dilation with                            an 18-19-20 mm balloon dilator was performed to 20                            mm with an appropriate mucosal wrent noted.                           The exam of the esophagus was otherwise normal.                           Diffuse mild inflammation characterized by erythema                            and granularity was found in the entire examined                            stomach. Biopsies were taken with a cold forceps                            for histology and Helicobacter pylori testing.                           The exam of the stomach was otherwise normal.                           The duodenal bulb and second portion of the                            duodenum were normal. Complications:            No immediate  complications. Estimated blood loss:                            Minimal. Estimated Blood Loss:     Estimated blood loss was minimal. Impression:               - Esophagogastric landmarks identified.                           - 2 cm hiatal hernia.                           - Z-line irregular. Biopsied.                           -  Benign-appearing esophageal stenosis. Dilated to                            35m with good result.                           - Mild gastritis. Biopsied.                           - Normal duodenal bulb and second portion of the                            duodenum. Recommendation:           - Patient has a contact number available for                            emergencies. The signs and symptoms of potential                            delayed complications were discussed with the                            patient. Return to normal activities tomorrow.                            Written discharge instructions were provided to the                            patient.                           - Resume previous diet.                           - Continue present medications.                           - Await pathology results.                           - Avoid NSAIDs if possible SRemo LippsP. Jerry Sinning MD, MD 08/18/2016 10:40:53 AM This report has been signed electronically.

## 2016-08-19 ENCOUNTER — Telehealth: Payer: Self-pay

## 2016-08-19 NOTE — Telephone Encounter (Signed)
Left message on answering machine. 

## 2016-08-19 NOTE — Telephone Encounter (Signed)
  Follow up Call-  Call back number 08/18/2016  Post procedure Call Back phone  # [phone]  Permission to leave phone message Yes  Some recent data might be hidden     Patient questions:  Do you have a fever, pain , or abdominal swelling? No. Pain Score  0 *  Have you tolerated food without any problems? Yes.    Have you been able to return to your normal activities? Yes.    Do you have any questions about your discharge instructions: Diet   No. Medications  No. Follow up visit  No.  Do you have questions or concerns about your Care? No.  Actions: * If pain score is 4 or above: No action needed, pain <4.

## 2016-08-23 ENCOUNTER — Encounter: Payer: Self-pay | Admitting: Gastroenterology

## 2016-08-31 DIAGNOSIS — I10 Essential (primary) hypertension: Secondary | ICD-10-CM | POA: Diagnosis not present

## 2016-09-01 DIAGNOSIS — N401 Enlarged prostate with lower urinary tract symptoms: Secondary | ICD-10-CM | POA: Diagnosis not present

## 2016-09-02 DIAGNOSIS — M79672 Pain in left foot: Secondary | ICD-10-CM | POA: Diagnosis not present

## 2016-09-02 DIAGNOSIS — I10 Essential (primary) hypertension: Secondary | ICD-10-CM | POA: Diagnosis not present

## 2016-09-08 DIAGNOSIS — R351 Nocturia: Secondary | ICD-10-CM | POA: Diagnosis not present

## 2016-09-08 DIAGNOSIS — N401 Enlarged prostate with lower urinary tract symptoms: Secondary | ICD-10-CM | POA: Diagnosis not present

## 2016-09-08 DIAGNOSIS — R972 Elevated prostate specific antigen [PSA]: Secondary | ICD-10-CM | POA: Diagnosis not present

## 2017-03-06 DIAGNOSIS — D649 Anemia, unspecified: Secondary | ICD-10-CM | POA: Diagnosis not present

## 2017-03-06 DIAGNOSIS — E662 Morbid (severe) obesity with alveolar hypoventilation: Secondary | ICD-10-CM | POA: Diagnosis not present

## 2017-03-06 DIAGNOSIS — R072 Precordial pain: Secondary | ICD-10-CM | POA: Diagnosis not present

## 2017-03-06 DIAGNOSIS — R7989 Other specified abnormal findings of blood chemistry: Secondary | ICD-10-CM | POA: Diagnosis not present

## 2017-03-06 DIAGNOSIS — E872 Acidosis: Secondary | ICD-10-CM | POA: Diagnosis not present

## 2017-03-06 DIAGNOSIS — C649 Malignant neoplasm of unspecified kidney, except renal pelvis: Secondary | ICD-10-CM | POA: Diagnosis not present

## 2017-03-06 DIAGNOSIS — N281 Cyst of kidney, acquired: Secondary | ICD-10-CM | POA: Diagnosis not present

## 2017-03-06 DIAGNOSIS — R0602 Shortness of breath: Secondary | ICD-10-CM | POA: Diagnosis not present

## 2017-03-06 DIAGNOSIS — N400 Enlarged prostate without lower urinary tract symptoms: Secondary | ICD-10-CM | POA: Diagnosis not present

## 2017-03-06 DIAGNOSIS — I7102 Dissection of abdominal aorta: Secondary | ICD-10-CM | POA: Diagnosis not present

## 2017-03-06 DIAGNOSIS — Z452 Encounter for adjustment and management of vascular access device: Secondary | ICD-10-CM | POA: Diagnosis not present

## 2017-03-06 DIAGNOSIS — E873 Alkalosis: Secondary | ICD-10-CM | POA: Diagnosis not present

## 2017-03-06 DIAGNOSIS — I509 Heart failure, unspecified: Secondary | ICD-10-CM | POA: Diagnosis not present

## 2017-03-06 DIAGNOSIS — J9811 Atelectasis: Secondary | ICD-10-CM | POA: Diagnosis not present

## 2017-03-06 DIAGNOSIS — N280 Ischemia and infarction of kidney: Secondary | ICD-10-CM

## 2017-03-06 DIAGNOSIS — I7773 Dissection of renal artery: Secondary | ICD-10-CM | POA: Diagnosis not present

## 2017-03-06 DIAGNOSIS — I7100 Dissection of unspecified site of aorta: Secondary | ICD-10-CM | POA: Diagnosis present

## 2017-03-06 DIAGNOSIS — R0689 Other abnormalities of breathing: Secondary | ICD-10-CM | POA: Diagnosis not present

## 2017-03-06 DIAGNOSIS — I7101 Dissection of thoracic aorta: Secondary | ICD-10-CM | POA: Diagnosis not present

## 2017-03-06 DIAGNOSIS — R0789 Other chest pain: Secondary | ICD-10-CM | POA: Diagnosis not present

## 2017-03-06 DIAGNOSIS — R0989 Other specified symptoms and signs involving the circulatory and respiratory systems: Secondary | ICD-10-CM | POA: Diagnosis not present

## 2017-03-06 DIAGNOSIS — N179 Acute kidney failure, unspecified: Secondary | ICD-10-CM | POA: Diagnosis not present

## 2017-03-06 DIAGNOSIS — R740 Nonspecific elevation of levels of transaminase and lactic acid dehydrogenase [LDH]: Secondary | ICD-10-CM | POA: Diagnosis not present

## 2017-03-06 DIAGNOSIS — R112 Nausea with vomiting, unspecified: Secondary | ICD-10-CM | POA: Diagnosis not present

## 2017-03-06 DIAGNOSIS — I361 Nonrheumatic tricuspid (valve) insufficiency: Secondary | ICD-10-CM | POA: Diagnosis not present

## 2017-03-06 DIAGNOSIS — N2889 Other specified disorders of kidney and ureter: Secondary | ICD-10-CM | POA: Diagnosis not present

## 2017-03-06 DIAGNOSIS — J9601 Acute respiratory failure with hypoxia: Secondary | ICD-10-CM | POA: Diagnosis not present

## 2017-03-06 DIAGNOSIS — E1165 Type 2 diabetes mellitus with hyperglycemia: Secondary | ICD-10-CM | POA: Diagnosis not present

## 2017-03-06 DIAGNOSIS — I110 Hypertensive heart disease with heart failure: Secondary | ICD-10-CM | POA: Diagnosis not present

## 2017-03-06 DIAGNOSIS — I10 Essential (primary) hypertension: Secondary | ICD-10-CM | POA: Diagnosis not present

## 2017-03-06 DIAGNOSIS — N401 Enlarged prostate with lower urinary tract symptoms: Secondary | ICD-10-CM | POA: Diagnosis not present

## 2017-03-06 DIAGNOSIS — R0600 Dyspnea, unspecified: Secondary | ICD-10-CM | POA: Diagnosis not present

## 2017-03-06 DIAGNOSIS — K5900 Constipation, unspecified: Secondary | ICD-10-CM | POA: Diagnosis not present

## 2017-03-06 DIAGNOSIS — I161 Hypertensive emergency: Secondary | ICD-10-CM | POA: Diagnosis not present

## 2017-03-06 DIAGNOSIS — D72829 Elevated white blood cell count, unspecified: Secondary | ICD-10-CM | POA: Diagnosis not present

## 2017-03-06 DIAGNOSIS — I340 Nonrheumatic mitral (valve) insufficiency: Secondary | ICD-10-CM | POA: Diagnosis not present

## 2017-03-06 DIAGNOSIS — I7772 Dissection of iliac artery: Secondary | ICD-10-CM | POA: Diagnosis not present

## 2017-03-06 DIAGNOSIS — I7103 Dissection of thoracoabdominal aorta: Secondary | ICD-10-CM | POA: Diagnosis not present

## 2017-03-06 DIAGNOSIS — I5031 Acute diastolic (congestive) heart failure: Secondary | ICD-10-CM | POA: Diagnosis not present

## 2017-03-06 DIAGNOSIS — I350 Nonrheumatic aortic (valve) stenosis: Secondary | ICD-10-CM | POA: Diagnosis not present

## 2017-03-06 HISTORY — DX: Ischemia and infarction of kidney: N28.0

## 2017-03-06 HISTORY — DX: Dissection of unspecified site of aorta: I71.00

## 2017-03-07 DIAGNOSIS — R7989 Other specified abnormal findings of blood chemistry: Secondary | ICD-10-CM | POA: Diagnosis not present

## 2017-03-07 DIAGNOSIS — R112 Nausea with vomiting, unspecified: Secondary | ICD-10-CM | POA: Diagnosis not present

## 2017-03-07 DIAGNOSIS — Z452 Encounter for adjustment and management of vascular access device: Secondary | ICD-10-CM | POA: Diagnosis not present

## 2017-03-07 DIAGNOSIS — I7101 Dissection of thoracic aorta: Secondary | ICD-10-CM | POA: Diagnosis not present

## 2017-03-07 DIAGNOSIS — D649 Anemia, unspecified: Secondary | ICD-10-CM | POA: Diagnosis not present

## 2017-03-08 DIAGNOSIS — D649 Anemia, unspecified: Secondary | ICD-10-CM | POA: Diagnosis not present

## 2017-03-08 DIAGNOSIS — I7101 Dissection of thoracic aorta: Secondary | ICD-10-CM | POA: Diagnosis not present

## 2017-03-08 DIAGNOSIS — R112 Nausea with vomiting, unspecified: Secondary | ICD-10-CM | POA: Diagnosis not present

## 2017-03-08 DIAGNOSIS — R740 Nonspecific elevation of levels of transaminase and lactic acid dehydrogenase [LDH]: Secondary | ICD-10-CM | POA: Diagnosis not present

## 2017-03-09 DIAGNOSIS — J9811 Atelectasis: Secondary | ICD-10-CM | POA: Diagnosis not present

## 2017-03-09 DIAGNOSIS — R0600 Dyspnea, unspecified: Secondary | ICD-10-CM | POA: Diagnosis not present

## 2017-03-09 DIAGNOSIS — R0989 Other specified symptoms and signs involving the circulatory and respiratory systems: Secondary | ICD-10-CM | POA: Diagnosis not present

## 2017-03-10 DIAGNOSIS — I350 Nonrheumatic aortic (valve) stenosis: Secondary | ICD-10-CM | POA: Diagnosis not present

## 2017-03-10 DIAGNOSIS — I340 Nonrheumatic mitral (valve) insufficiency: Secondary | ICD-10-CM | POA: Diagnosis not present

## 2017-03-10 DIAGNOSIS — I361 Nonrheumatic tricuspid (valve) insufficiency: Secondary | ICD-10-CM | POA: Diagnosis not present

## 2017-03-10 DIAGNOSIS — I509 Heart failure, unspecified: Secondary | ICD-10-CM | POA: Diagnosis not present

## 2017-03-12 DIAGNOSIS — E1165 Type 2 diabetes mellitus with hyperglycemia: Secondary | ICD-10-CM | POA: Diagnosis not present

## 2017-03-13 DIAGNOSIS — N2889 Other specified disorders of kidney and ureter: Secondary | ICD-10-CM | POA: Diagnosis not present

## 2017-03-13 DIAGNOSIS — N401 Enlarged prostate with lower urinary tract symptoms: Secondary | ICD-10-CM | POA: Diagnosis not present

## 2017-03-13 DIAGNOSIS — N280 Ischemia and infarction of kidney: Secondary | ICD-10-CM | POA: Diagnosis not present

## 2017-03-13 DIAGNOSIS — E1165 Type 2 diabetes mellitus with hyperglycemia: Secondary | ICD-10-CM | POA: Diagnosis not present

## 2017-03-14 DIAGNOSIS — J9811 Atelectasis: Secondary | ICD-10-CM | POA: Diagnosis not present

## 2017-03-14 DIAGNOSIS — N281 Cyst of kidney, acquired: Secondary | ICD-10-CM | POA: Diagnosis not present

## 2017-03-14 DIAGNOSIS — N401 Enlarged prostate with lower urinary tract symptoms: Secondary | ICD-10-CM | POA: Diagnosis not present

## 2017-03-14 DIAGNOSIS — R0600 Dyspnea, unspecified: Secondary | ICD-10-CM | POA: Diagnosis not present

## 2017-03-14 DIAGNOSIS — N2889 Other specified disorders of kidney and ureter: Secondary | ICD-10-CM | POA: Diagnosis not present

## 2017-03-14 DIAGNOSIS — R0602 Shortness of breath: Secondary | ICD-10-CM | POA: Diagnosis not present

## 2017-03-14 DIAGNOSIS — E1165 Type 2 diabetes mellitus with hyperglycemia: Secondary | ICD-10-CM | POA: Diagnosis not present

## 2017-03-14 DIAGNOSIS — E662 Morbid (severe) obesity with alveolar hypoventilation: Secondary | ICD-10-CM | POA: Diagnosis not present

## 2017-03-15 DIAGNOSIS — E1165 Type 2 diabetes mellitus with hyperglycemia: Secondary | ICD-10-CM | POA: Diagnosis not present

## 2017-03-15 DIAGNOSIS — I7101 Dissection of thoracic aorta: Secondary | ICD-10-CM | POA: Diagnosis not present

## 2017-03-15 DIAGNOSIS — E662 Morbid (severe) obesity with alveolar hypoventilation: Secondary | ICD-10-CM | POA: Diagnosis not present

## 2017-03-15 DIAGNOSIS — N400 Enlarged prostate without lower urinary tract symptoms: Secondary | ICD-10-CM | POA: Diagnosis not present

## 2017-03-15 DIAGNOSIS — I10 Essential (primary) hypertension: Secondary | ICD-10-CM | POA: Diagnosis not present

## 2017-03-15 DIAGNOSIS — R0689 Other abnormalities of breathing: Secondary | ICD-10-CM | POA: Diagnosis not present

## 2017-03-16 DIAGNOSIS — R0689 Other abnormalities of breathing: Secondary | ICD-10-CM | POA: Diagnosis not present

## 2017-03-16 DIAGNOSIS — I7101 Dissection of thoracic aorta: Secondary | ICD-10-CM | POA: Diagnosis not present

## 2017-03-16 DIAGNOSIS — N401 Enlarged prostate with lower urinary tract symptoms: Secondary | ICD-10-CM | POA: Diagnosis not present

## 2017-03-16 DIAGNOSIS — N2889 Other specified disorders of kidney and ureter: Secondary | ICD-10-CM | POA: Diagnosis not present

## 2017-03-16 DIAGNOSIS — I10 Essential (primary) hypertension: Secondary | ICD-10-CM | POA: Diagnosis not present

## 2017-03-16 DIAGNOSIS — E1165 Type 2 diabetes mellitus with hyperglycemia: Secondary | ICD-10-CM | POA: Diagnosis not present

## 2017-03-16 DIAGNOSIS — N400 Enlarged prostate without lower urinary tract symptoms: Secondary | ICD-10-CM | POA: Diagnosis not present

## 2017-03-17 DIAGNOSIS — R0689 Other abnormalities of breathing: Secondary | ICD-10-CM | POA: Diagnosis not present

## 2017-03-17 DIAGNOSIS — E1165 Type 2 diabetes mellitus with hyperglycemia: Secondary | ICD-10-CM | POA: Diagnosis not present

## 2017-03-17 DIAGNOSIS — I7101 Dissection of thoracic aorta: Secondary | ICD-10-CM | POA: Diagnosis not present

## 2017-03-17 DIAGNOSIS — I10 Essential (primary) hypertension: Secondary | ICD-10-CM | POA: Diagnosis not present

## 2017-03-17 DIAGNOSIS — N400 Enlarged prostate without lower urinary tract symptoms: Secondary | ICD-10-CM | POA: Diagnosis not present

## 2017-03-18 DIAGNOSIS — R339 Retention of urine, unspecified: Secondary | ICD-10-CM | POA: Diagnosis not present

## 2017-03-18 DIAGNOSIS — L89152 Pressure ulcer of sacral region, stage 2: Secondary | ICD-10-CM | POA: Diagnosis not present

## 2017-03-18 DIAGNOSIS — E119 Type 2 diabetes mellitus without complications: Secondary | ICD-10-CM | POA: Diagnosis not present

## 2017-03-18 DIAGNOSIS — R5381 Other malaise: Secondary | ICD-10-CM | POA: Diagnosis not present

## 2017-03-18 DIAGNOSIS — R531 Weakness: Secondary | ICD-10-CM | POA: Diagnosis not present

## 2017-03-18 DIAGNOSIS — I10 Essential (primary) hypertension: Secondary | ICD-10-CM | POA: Diagnosis not present

## 2017-03-18 DIAGNOSIS — D649 Anemia, unspecified: Secondary | ICD-10-CM | POA: Diagnosis not present

## 2017-03-18 DIAGNOSIS — G4733 Obstructive sleep apnea (adult) (pediatric): Secondary | ICD-10-CM | POA: Diagnosis not present

## 2017-03-18 DIAGNOSIS — E785 Hyperlipidemia, unspecified: Secondary | ICD-10-CM | POA: Diagnosis not present

## 2017-03-18 DIAGNOSIS — N3091 Cystitis, unspecified with hematuria: Secondary | ICD-10-CM | POA: Diagnosis not present

## 2017-03-18 DIAGNOSIS — R0689 Other abnormalities of breathing: Secondary | ICD-10-CM | POA: Diagnosis not present

## 2017-03-18 DIAGNOSIS — E1165 Type 2 diabetes mellitus with hyperglycemia: Secondary | ICD-10-CM | POA: Diagnosis not present

## 2017-03-18 DIAGNOSIS — K5900 Constipation, unspecified: Secondary | ICD-10-CM | POA: Diagnosis not present

## 2017-03-18 DIAGNOSIS — E669 Obesity, unspecified: Secondary | ICD-10-CM | POA: Diagnosis not present

## 2017-03-18 DIAGNOSIS — M545 Low back pain: Secondary | ICD-10-CM | POA: Diagnosis not present

## 2017-03-18 DIAGNOSIS — I7103 Dissection of thoracoabdominal aorta: Secondary | ICD-10-CM | POA: Diagnosis not present

## 2017-03-18 DIAGNOSIS — Z7409 Other reduced mobility: Secondary | ICD-10-CM | POA: Diagnosis not present

## 2017-03-18 DIAGNOSIS — R2689 Other abnormalities of gait and mobility: Secondary | ICD-10-CM | POA: Diagnosis not present

## 2017-03-18 DIAGNOSIS — R279 Unspecified lack of coordination: Secondary | ICD-10-CM | POA: Diagnosis not present

## 2017-03-18 DIAGNOSIS — K219 Gastro-esophageal reflux disease without esophagitis: Secondary | ICD-10-CM | POA: Diagnosis not present

## 2017-03-18 DIAGNOSIS — I7101 Dissection of thoracic aorta: Secondary | ICD-10-CM | POA: Diagnosis not present

## 2017-03-18 DIAGNOSIS — R269 Unspecified abnormalities of gait and mobility: Secondary | ICD-10-CM | POA: Diagnosis not present

## 2017-03-18 DIAGNOSIS — N400 Enlarged prostate without lower urinary tract symptoms: Secondary | ICD-10-CM | POA: Diagnosis not present

## 2017-03-19 DIAGNOSIS — Z7409 Other reduced mobility: Secondary | ICD-10-CM | POA: Diagnosis not present

## 2017-03-19 DIAGNOSIS — K219 Gastro-esophageal reflux disease without esophagitis: Secondary | ICD-10-CM | POA: Diagnosis not present

## 2017-03-19 DIAGNOSIS — R5381 Other malaise: Secondary | ICD-10-CM | POA: Diagnosis not present

## 2017-03-19 DIAGNOSIS — G4733 Obstructive sleep apnea (adult) (pediatric): Secondary | ICD-10-CM | POA: Diagnosis not present

## 2017-03-19 DIAGNOSIS — R269 Unspecified abnormalities of gait and mobility: Secondary | ICD-10-CM | POA: Diagnosis not present

## 2017-03-19 DIAGNOSIS — I7101 Dissection of thoracic aorta: Secondary | ICD-10-CM | POA: Diagnosis not present

## 2017-03-19 DIAGNOSIS — I10 Essential (primary) hypertension: Secondary | ICD-10-CM | POA: Diagnosis not present

## 2017-03-19 DIAGNOSIS — R339 Retention of urine, unspecified: Secondary | ICD-10-CM | POA: Diagnosis not present

## 2017-03-20 DIAGNOSIS — Z7409 Other reduced mobility: Secondary | ICD-10-CM | POA: Diagnosis not present

## 2017-03-20 DIAGNOSIS — I7101 Dissection of thoracic aorta: Secondary | ICD-10-CM | POA: Diagnosis not present

## 2017-03-20 DIAGNOSIS — R339 Retention of urine, unspecified: Secondary | ICD-10-CM | POA: Diagnosis not present

## 2017-03-20 DIAGNOSIS — K219 Gastro-esophageal reflux disease without esophagitis: Secondary | ICD-10-CM | POA: Diagnosis not present

## 2017-03-20 DIAGNOSIS — G4733 Obstructive sleep apnea (adult) (pediatric): Secondary | ICD-10-CM | POA: Diagnosis not present

## 2017-03-20 DIAGNOSIS — R269 Unspecified abnormalities of gait and mobility: Secondary | ICD-10-CM | POA: Diagnosis not present

## 2017-03-20 DIAGNOSIS — I10 Essential (primary) hypertension: Secondary | ICD-10-CM | POA: Diagnosis not present

## 2017-03-20 DIAGNOSIS — R5381 Other malaise: Secondary | ICD-10-CM | POA: Diagnosis not present

## 2017-03-21 DIAGNOSIS — G4733 Obstructive sleep apnea (adult) (pediatric): Secondary | ICD-10-CM | POA: Diagnosis not present

## 2017-03-21 DIAGNOSIS — K219 Gastro-esophageal reflux disease without esophagitis: Secondary | ICD-10-CM | POA: Diagnosis not present

## 2017-03-21 DIAGNOSIS — I7101 Dissection of thoracic aorta: Secondary | ICD-10-CM | POA: Diagnosis not present

## 2017-03-21 DIAGNOSIS — R339 Retention of urine, unspecified: Secondary | ICD-10-CM | POA: Diagnosis not present

## 2017-03-21 DIAGNOSIS — I10 Essential (primary) hypertension: Secondary | ICD-10-CM | POA: Diagnosis not present

## 2017-03-21 DIAGNOSIS — R269 Unspecified abnormalities of gait and mobility: Secondary | ICD-10-CM | POA: Diagnosis not present

## 2017-03-21 DIAGNOSIS — Z7409 Other reduced mobility: Secondary | ICD-10-CM | POA: Diagnosis not present

## 2017-03-21 DIAGNOSIS — R5381 Other malaise: Secondary | ICD-10-CM | POA: Diagnosis not present

## 2017-03-22 DIAGNOSIS — K219 Gastro-esophageal reflux disease without esophagitis: Secondary | ICD-10-CM | POA: Diagnosis not present

## 2017-03-22 DIAGNOSIS — Z7409 Other reduced mobility: Secondary | ICD-10-CM | POA: Diagnosis not present

## 2017-03-22 DIAGNOSIS — I7101 Dissection of thoracic aorta: Secondary | ICD-10-CM | POA: Diagnosis not present

## 2017-03-22 DIAGNOSIS — G4733 Obstructive sleep apnea (adult) (pediatric): Secondary | ICD-10-CM | POA: Diagnosis not present

## 2017-03-22 DIAGNOSIS — I10 Essential (primary) hypertension: Secondary | ICD-10-CM | POA: Diagnosis not present

## 2017-03-22 DIAGNOSIS — R339 Retention of urine, unspecified: Secondary | ICD-10-CM | POA: Diagnosis not present

## 2017-03-22 DIAGNOSIS — R269 Unspecified abnormalities of gait and mobility: Secondary | ICD-10-CM | POA: Diagnosis not present

## 2017-03-22 DIAGNOSIS — R5381 Other malaise: Secondary | ICD-10-CM | POA: Diagnosis not present

## 2017-03-23 DIAGNOSIS — I10 Essential (primary) hypertension: Secondary | ICD-10-CM | POA: Diagnosis not present

## 2017-03-23 DIAGNOSIS — Z7409 Other reduced mobility: Secondary | ICD-10-CM | POA: Diagnosis not present

## 2017-03-23 DIAGNOSIS — R339 Retention of urine, unspecified: Secondary | ICD-10-CM | POA: Diagnosis not present

## 2017-03-23 DIAGNOSIS — K219 Gastro-esophageal reflux disease without esophagitis: Secondary | ICD-10-CM | POA: Diagnosis not present

## 2017-03-23 DIAGNOSIS — R5381 Other malaise: Secondary | ICD-10-CM | POA: Diagnosis not present

## 2017-03-23 DIAGNOSIS — G4733 Obstructive sleep apnea (adult) (pediatric): Secondary | ICD-10-CM | POA: Diagnosis not present

## 2017-03-23 DIAGNOSIS — I7101 Dissection of thoracic aorta: Secondary | ICD-10-CM | POA: Diagnosis not present

## 2017-03-23 DIAGNOSIS — R269 Unspecified abnormalities of gait and mobility: Secondary | ICD-10-CM | POA: Diagnosis not present

## 2017-03-24 DIAGNOSIS — G4733 Obstructive sleep apnea (adult) (pediatric): Secondary | ICD-10-CM | POA: Diagnosis not present

## 2017-03-24 DIAGNOSIS — K219 Gastro-esophageal reflux disease without esophagitis: Secondary | ICD-10-CM | POA: Diagnosis not present

## 2017-03-24 DIAGNOSIS — I7101 Dissection of thoracic aorta: Secondary | ICD-10-CM | POA: Diagnosis not present

## 2017-03-24 DIAGNOSIS — Z7409 Other reduced mobility: Secondary | ICD-10-CM | POA: Diagnosis not present

## 2017-03-24 DIAGNOSIS — R269 Unspecified abnormalities of gait and mobility: Secondary | ICD-10-CM | POA: Diagnosis not present

## 2017-03-24 DIAGNOSIS — I10 Essential (primary) hypertension: Secondary | ICD-10-CM | POA: Diagnosis not present

## 2017-03-24 DIAGNOSIS — R339 Retention of urine, unspecified: Secondary | ICD-10-CM | POA: Diagnosis not present

## 2017-03-24 DIAGNOSIS — R5381 Other malaise: Secondary | ICD-10-CM | POA: Diagnosis not present

## 2017-03-25 DIAGNOSIS — K219 Gastro-esophageal reflux disease without esophagitis: Secondary | ICD-10-CM | POA: Diagnosis not present

## 2017-03-25 DIAGNOSIS — R339 Retention of urine, unspecified: Secondary | ICD-10-CM | POA: Diagnosis not present

## 2017-03-25 DIAGNOSIS — I7101 Dissection of thoracic aorta: Secondary | ICD-10-CM | POA: Diagnosis not present

## 2017-03-25 DIAGNOSIS — R269 Unspecified abnormalities of gait and mobility: Secondary | ICD-10-CM | POA: Diagnosis not present

## 2017-03-25 DIAGNOSIS — G4733 Obstructive sleep apnea (adult) (pediatric): Secondary | ICD-10-CM | POA: Diagnosis not present

## 2017-03-25 DIAGNOSIS — Z7409 Other reduced mobility: Secondary | ICD-10-CM | POA: Diagnosis not present

## 2017-03-25 DIAGNOSIS — R5381 Other malaise: Secondary | ICD-10-CM | POA: Diagnosis not present

## 2017-03-25 DIAGNOSIS — I10 Essential (primary) hypertension: Secondary | ICD-10-CM | POA: Diagnosis not present

## 2017-03-26 DIAGNOSIS — K219 Gastro-esophageal reflux disease without esophagitis: Secondary | ICD-10-CM | POA: Diagnosis not present

## 2017-03-26 DIAGNOSIS — I10 Essential (primary) hypertension: Secondary | ICD-10-CM | POA: Diagnosis not present

## 2017-03-26 DIAGNOSIS — G4733 Obstructive sleep apnea (adult) (pediatric): Secondary | ICD-10-CM | POA: Diagnosis not present

## 2017-03-26 DIAGNOSIS — I7101 Dissection of thoracic aorta: Secondary | ICD-10-CM | POA: Diagnosis not present

## 2017-03-27 DIAGNOSIS — K219 Gastro-esophageal reflux disease without esophagitis: Secondary | ICD-10-CM | POA: Diagnosis not present

## 2017-03-27 DIAGNOSIS — I7101 Dissection of thoracic aorta: Secondary | ICD-10-CM | POA: Diagnosis not present

## 2017-03-27 DIAGNOSIS — I10 Essential (primary) hypertension: Secondary | ICD-10-CM | POA: Diagnosis not present

## 2017-03-27 DIAGNOSIS — G4733 Obstructive sleep apnea (adult) (pediatric): Secondary | ICD-10-CM | POA: Diagnosis not present

## 2017-03-28 DIAGNOSIS — I10 Essential (primary) hypertension: Secondary | ICD-10-CM | POA: Diagnosis not present

## 2017-03-28 DIAGNOSIS — G4733 Obstructive sleep apnea (adult) (pediatric): Secondary | ICD-10-CM | POA: Diagnosis not present

## 2017-03-28 DIAGNOSIS — K219 Gastro-esophageal reflux disease without esophagitis: Secondary | ICD-10-CM | POA: Diagnosis not present

## 2017-03-28 DIAGNOSIS — R5381 Other malaise: Secondary | ICD-10-CM | POA: Diagnosis not present

## 2017-03-28 DIAGNOSIS — R339 Retention of urine, unspecified: Secondary | ICD-10-CM | POA: Diagnosis not present

## 2017-03-28 DIAGNOSIS — I7101 Dissection of thoracic aorta: Secondary | ICD-10-CM | POA: Diagnosis not present

## 2017-03-28 DIAGNOSIS — Z7409 Other reduced mobility: Secondary | ICD-10-CM | POA: Diagnosis not present

## 2017-03-28 DIAGNOSIS — R269 Unspecified abnormalities of gait and mobility: Secondary | ICD-10-CM | POA: Diagnosis not present

## 2017-03-29 DIAGNOSIS — I10 Essential (primary) hypertension: Secondary | ICD-10-CM | POA: Diagnosis not present

## 2017-03-29 DIAGNOSIS — I7101 Dissection of thoracic aorta: Secondary | ICD-10-CM | POA: Diagnosis not present

## 2017-03-29 DIAGNOSIS — K219 Gastro-esophageal reflux disease without esophagitis: Secondary | ICD-10-CM | POA: Diagnosis not present

## 2017-03-29 DIAGNOSIS — G4733 Obstructive sleep apnea (adult) (pediatric): Secondary | ICD-10-CM | POA: Diagnosis not present

## 2017-03-29 DIAGNOSIS — R269 Unspecified abnormalities of gait and mobility: Secondary | ICD-10-CM | POA: Diagnosis not present

## 2017-03-29 DIAGNOSIS — R339 Retention of urine, unspecified: Secondary | ICD-10-CM | POA: Diagnosis not present

## 2017-03-29 DIAGNOSIS — Z7409 Other reduced mobility: Secondary | ICD-10-CM | POA: Diagnosis not present

## 2017-03-29 DIAGNOSIS — R5381 Other malaise: Secondary | ICD-10-CM | POA: Diagnosis not present

## 2017-04-27 ENCOUNTER — Encounter: Payer: Self-pay | Admitting: Vascular Surgery

## 2017-04-27 ENCOUNTER — Ambulatory Visit (INDEPENDENT_AMBULATORY_CARE_PROVIDER_SITE_OTHER): Payer: BLUE CROSS/BLUE SHIELD | Admitting: Vascular Surgery

## 2017-04-27 VITALS — BP 114/69 | HR 70 | Resp 20 | Ht 67.0 in | Wt 197.0 lb

## 2017-04-27 DIAGNOSIS — I7101 Dissection of thoracic aorta: Secondary | ICD-10-CM

## 2017-04-27 DIAGNOSIS — I71019 Dissection of thoracic aorta, unspecified: Secondary | ICD-10-CM

## 2017-04-27 NOTE — Progress Notes (Signed)
Patient ID: Jerry Weiss, male   DOB: 06-10-1955, 62 y.o.   MRN: [id number]  Reason for Consult: New Patient (Initial Visit) (eval aortic dissection )   Referred by [name], MD  Subjective:     HPI:  Jerry Weiss is a 62 y.o. male with history of hypertension and now the diagnosis diabetic.  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  While hospitalized he also had urinary retention was treated with ciprofloxacin.  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  He has never had any problems with his legs.  His chest pain is fully resolved.  He is now returned to [facility] where he is from and has no further issues.  He now takes 4 medicines for his blood pressure.  He is not taking any antiplatelet or anticoagulants.  Past Medical History:  Diagnosis Date  . Diabetes mellitus without complication ([place])   . Hypertension   . Sleep apnea    Wears CPAP at night   Family History  Problem Relation Age of Onset  . Allergies Mother   . Diabetes Mother   . Stroke Mother   . Skin cancer Mother   . Allergies Sister   . Asthma Daughter        childhood  . Asthma Father        developed a lot of medical problems after a fall  . Colon cancer Neg Hx   . Stomach cancer Neg Hx   . Rectal cancer Neg Hx   . Liver cancer Neg Hx   . Esophageal cancer Neg Hx    Past Surgical History:  Procedure Laterality Date  . UPPER GASTROINTESTINAL ENDOSCOPY      Short Social History:  Social History   Tobacco Use  . Smoking status: Never Smoker  . Smokeless tobacco: Never Used  Substance Use Topics  . Alcohol use: No    Allergies  Allergen Reactions  . Codeine   . Shellfish-Derived Products     Current Outpatient Medications  Medication Sig Dispense Refill  . cetirizine (ZYRTEC) 10 MG tablet Take 10 mg by mouth daily as needed.      . fluticasone (FLONASE) 50 MCG/ACT nasal spray Place 2 sprays into the nose daily as needed.     . hydrALAZINE (APRESOLINE) 100 MG tablet Take 100 mg by mouth 3 (three) times daily.    Marland Kitchen labetalol (NORMODYNE) 200 MG tablet Take 200 mg by mouth 2 (two) times daily.    Marland Kitchen lisinopril (PRINIVIL,ZESTRIL) 20 MG tablet Take 20 mg by mouth daily.    . Probiotic Product (VSL#3) CAPS Take 1 capsule by mouth 2 times daily at 12 noon and 4 pm. 60 capsule 11  . sitaGLIPtin (JANUVIA) 100 MG tablet Take 100 mg by mouth daily.    . tamsulosin (FLOMAX) 0.4 MG CAPS capsule Take 0.8 mg by mouth daily.    Marland Kitchen alfuzosin (UROXATRAL) 10 MG 24 hr tablet daily.  0  . GLUCOSAMINE-CHONDROITIN PO daily.     . Multiple Vitamin (MULTIVITAMIN) capsule Take 1 capsule by mouth daily.      . NON FORMULARY as needed. Eye drops for allergies.    . pantoprazole (PROTONIX) 20 MG tablet 2 (two) times daily.  0   Current Facility-Administered Medications  Medication Dose Route Frequency Provider Last Rate Last Dose  . 0.9 %  sodium chloride infusion  500 mL Intravenous Continuous Armbruster, Carlota Raspberry, MD        Review of Systems  Constitutional:  Constitutional negative. HENT: HENT negative.  Eyes: Eyes negative.  Respiratory: Positive for cough and shortness of breath.  Cardiovascular: Cardiovascular negative.  GI: Gastrointestinal negative.  GU: Positive for difficulty urinating.  Musculoskeletal: Musculoskeletal negative.  Skin: Skin negative.  Neurological: Neurological negative. Psychiatric:       anxiety       Objective:  Objective   Vitals:   04/27/17 1250  BP: 114/69  Pulse: 70  Resp: 20  SpO2: 97%  Weight: 197 lb (89.4 kg)  Height: 5' 7"  (1.702 m)   Body mass index is 30.85 kg/m.  Physical Exam  Constitutional: He is oriented to person, place, and time. He appears well-developed.  HENT:  Head: Normocephalic.  Eyes: Pupils are equal, round, and reactive to light.  Neck: Normal range of  motion. Neck supple.  Cardiovascular: Normal rate.  Pulses:      Carotid pulses are 2+ on the right side, and 2+ on the left side.      Radial pulses are 2+ on the right side, and 2+ on the left side.       Femoral pulses are 2+ on the right side, and 2+ on the left side.      Popliteal pulses are 2+ on the right side, and 2+ on the left side.       Dorsalis pedis pulses are 2+ on the right side, and 2+ on the left side.  Abdominal: Soft. He exhibits no mass.  Musculoskeletal: Normal range of motion. He exhibits no edema.  Neurological: He is alert and oriented to person, place, and time.  Skin: Skin is warm and dry.  Psychiatric: He has a normal mood and affect. His behavior is normal. Judgment normal.    Data: Outside CT scan read was reviewed demonstrated type B aortic dissection extending from this of the subclavian artery all the way to the common iliac arteries.  SMA and celiac axis involved in dissection flap as well as the left renal artery which demonstrates proximal and has been but no enhancement distally.  Creatinine at outside hospital was 1.02.     Assessment/Plan:     62 year old male presents for follow-up from hospitalization type B aortic dissection.  He was hospitalized for approximately 2 weeks and then went to rehab all this occurring in [place] but he is from [place].  Since discharge he has felt generally weak but does not have any back or abdominal pain nor does he have issues with his legs and he has palpable pulses throughout.  Given the extent of his dissection I think he should have follow-up imaging now that we are 6 weeks out from hospitalization.  We will get a CT angios dissection protocol to get our own baseline study.  I discussed with him the low likelihood of intervention but that we will need to keep watch on his aorta and he will need to manage his blood pressure.  I told him he is fine for all exercise except strenuous lifting.  We will  follow-up after CT angios.     Waynetta Sandy MD [facility]

## 2017-05-11 ENCOUNTER — Other Ambulatory Visit: Payer: Self-pay

## 2017-05-11 DIAGNOSIS — I7101 Dissection of thoracic aorta: Secondary | ICD-10-CM

## 2017-05-11 DIAGNOSIS — I71019 Dissection of thoracic aorta, unspecified: Secondary | ICD-10-CM

## 2017-05-25 ENCOUNTER — Ambulatory Visit
Admission: RE | Admit: 2017-05-25 | Discharge: 2017-05-25 | Disposition: A | Discharge status: Home / Self Care | Payer: BLUE CROSS/BLUE SHIELD | Source: Ambulatory Visit | Attending: Vascular Surgery | Admitting: Vascular Surgery

## 2017-05-25 DIAGNOSIS — I71019 Dissection of thoracic aorta, unspecified: Secondary | ICD-10-CM

## 2017-05-25 DIAGNOSIS — I7101 Dissection of thoracic aorta: Secondary | ICD-10-CM

## 2017-05-25 DIAGNOSIS — K429 Umbilical hernia without obstruction or gangrene: Secondary | ICD-10-CM | POA: Diagnosis not present

## 2017-05-25 DIAGNOSIS — I700 Atherosclerosis of aorta: Secondary | ICD-10-CM | POA: Diagnosis not present

## 2017-05-25 MED ORDER — IOPAMIDOL (ISOVUE-370) INJECTION 76%
75.0000 mL | Freq: Once | INTRAVENOUS | Status: AC | PRN
  Administered 2017-05-25: 75 mL via INTRAVENOUS

## 2017-05-27 ENCOUNTER — Encounter: Payer: Self-pay | Admitting: Vascular Surgery

## 2017-05-27 ENCOUNTER — Ambulatory Visit: Payer: BLUE CROSS/BLUE SHIELD | Admitting: Vascular Surgery

## 2017-05-27 ENCOUNTER — Other Ambulatory Visit: Payer: Self-pay

## 2017-05-27 VITALS — BP 130/75 | HR 60 | Resp 20 | Ht 67.0 in | Wt 197.0 lb

## 2017-05-27 DIAGNOSIS — I7101 Dissection of thoracic aorta: Secondary | ICD-10-CM | POA: Diagnosis not present

## 2017-05-27 DIAGNOSIS — I71019 Dissection of thoracic aorta, unspecified: Secondary | ICD-10-CM

## 2017-05-27 NOTE — Progress Notes (Signed)
Patient ID: Chrystian Cupples, male   DOB: 1955-05-22, 62 y.o.   MRN: [id number]  Reason for Consult: Follow-up (4 wk f/u CTA prior)   Referred by [name], MD  Subjective:     HPI:  Kimmy Parish is a 61 y.o. male returns for follow-up with CT angios after being admitted and hospitalized in [facility] for with type B dissection.  He now has CT Angio of his chest abdomen and pelvis for evaluation of his dissection.  He does not have any new abdominal or back pain or leg pain.  He has not been as active as he was prior to hospitalization but is attempting to get back to that.  States that his blood pressure has been well controlled at home.  Past Medical History:  Diagnosis Date  . Diabetes mellitus without complication ([place])   . Hypertension   . Sleep apnea    Wears CPAP at night   Family History  Problem Relation Age of Onset  . Allergies Mother   . Diabetes Mother   . Stroke Mother   . Skin cancer Mother   . Allergies Sister   . Asthma Daughter        childhood  . Asthma Father        developed a lot of medical problems after a fall  . Colon cancer Neg Hx   . Stomach cancer Neg Hx   . Rectal cancer Neg Hx   . Liver cancer Neg Hx   . Esophageal cancer Neg Hx    Past Surgical History:  Procedure Laterality Date  . UPPER GASTROINTESTINAL ENDOSCOPY      Short Social History:  Social History   Tobacco Use  . Smoking status: Never Smoker  . Smokeless tobacco: Never Used  Substance Use Topics  . Alcohol use: No    Allergies  Allergen Reactions  . Codeine   . Shellfish-Derived Products     Current Outpatient Medications  Medication Sig Dispense Refill  . cetirizine (ZYRTEC) 10 MG tablet Take 10 mg by mouth daily as needed.     . fluticasone (FLONASE) 50 MCG/ACT nasal spray Place 2 sprays into the nose daily as needed.     Marland Kitchen GLUCOSAMINE-CHONDROITIN PO daily.     . hydrALAZINE (APRESOLINE) 100 MG tablet Take 100 mg by mouth 3 (three) times daily.    Marland Kitchen  labetalol (NORMODYNE) 200 MG tablet Take 200 mg by mouth 3 (three) times daily.     Marland Kitchen lisinopril (PRINIVIL,ZESTRIL) 20 MG tablet Take 20 mg by mouth 2 (two) times daily.     . Multiple Vitamin (MULTIVITAMIN) capsule Take 1 capsule by mouth daily.      . NON FORMULARY as needed. Eye drops for allergies.    . pantoprazole (PROTONIX) 20 MG tablet 2 (two) times daily.  0  . Probiotic Product (VSL#3) CAPS Take 1 capsule by mouth 2 times daily at 12 noon and 4 pm. 60 capsule 11  . sitaGLIPtin (JANUVIA) 100 MG tablet Take 100 mg by mouth daily.    . tamsulosin (FLOMAX) 0.4 MG CAPS capsule Take 0.8 mg by mouth daily.    Marland Kitchen alfuzosin (UROXATRAL) 10 MG 24 hr tablet daily.  0   Current Facility-Administered Medications  Medication Dose Route Frequency Provider Last Rate Last Dose  . 0.9 %  sodium chloride infusion  500 mL Intravenous Continuous Armbruster, Carlota Raspberry, MD        Review of Systems  Constitutional: Positive for fatigue and unexpected  weight change.  HENT: HENT negative.  Eyes: Eyes negative.  Respiratory: Respiratory negative.  Cardiovascular: Cardiovascular negative.  GI: Gastrointestinal negative.  Skin: Skin negative.  Neurological: Neurological negative. Hematologic: Hematologic/lymphatic negative.  Psychiatric: Psychiatric negative.        Objective:  Objective   Vitals:   05/27/17 1352  BP: 130/75  Pulse: 60  Resp: 20  SpO2: 98%  Weight: 197 lb (89.4 kg)  Height: 5' 7"  (1.702 m)   Body mass index is 30.85 kg/m.  Physical Exam  Constitutional: He is oriented to person, place, and time. He appears well-developed.  Eyes: Pupils are equal, round, and reactive to light.  Neck: Normal range of motion.  Cardiovascular: Normal rate.  Pulses:      Radial pulses are 2+ on the right side, and 2+ on the left side.       Femoral pulses are 2+ on the right side, and 2+ on the left side.      Popliteal pulses are 2+ on the right side, and 2+ on the left side.    Pulmonary/Chest: Effort normal.  Abdominal: Soft. He exhibits no mass.  Musculoskeletal: Normal range of motion.  Neurological: He is alert and oriented to person, place, and time.  Skin: Skin is warm and dry.  Psychiatric: He has a normal mood and affect. His behavior is normal. Judgment and thought content normal.    Data: IMPRESSION: Chronic type B dissection, as the initial event is reported 6 weeks ago. The current largest diameter of the affected aorta is the distal transverse arch, measuring 3.7 cm. 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  Dissection flap involves celiac artery, superior mesenteric artery, the origin of the inferior mesenteric artery, and terminates within the proximal left common iliac artery. No flow compromise at any of these sites on the current CT.  Mild aortic atherosclerosis.  Aortic Atherosclerosis (ICD10-I70.0).  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  There is a small, 7 mm tissue projection at the inferior cortex of the right kidney, new from the comparison CT, which is concerning for a second small RCC.       Assessment/Plan:     62 year old male follows up from recent hospitalization with type B dissection.  He now has a CT angios as I cannot review the one from the outside hospital from [place].  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  He also has a right renal mass measuring 3.3 cm at the superior aspect and a 7 mm smaller one inferiorly.  Given that he has no function of his left kidney this is quite concerning.  We will get him seen by urology which he has a urologist already here in town but we will make the referral.  I will then see him in 6 months with repeat CT angio chest abdomen and pelvis.  He does demonstrate very good understanding of all of this and took the news quite well.  He  understands that new back or abdominal pain should merit evaluation given his history of dissection.     Waynetta Sandy MD [facility]

## 2017-06-01 ENCOUNTER — Other Ambulatory Visit: Payer: Self-pay

## 2017-06-01 DIAGNOSIS — I7101 Dissection of thoracic aorta: Secondary | ICD-10-CM

## 2017-06-01 DIAGNOSIS — I71019 Dissection of thoracic aorta, unspecified: Secondary | ICD-10-CM

## 2017-06-02 DIAGNOSIS — D4101 Neoplasm of uncertain behavior of right kidney: Secondary | ICD-10-CM | POA: Diagnosis not present

## 2017-06-02 DIAGNOSIS — R3915 Urgency of urination: Secondary | ICD-10-CM | POA: Diagnosis not present

## 2017-06-02 DIAGNOSIS — N401 Enlarged prostate with lower urinary tract symptoms: Secondary | ICD-10-CM | POA: Diagnosis not present

## 2017-06-06 DIAGNOSIS — E1165 Type 2 diabetes mellitus with hyperglycemia: Secondary | ICD-10-CM | POA: Diagnosis not present

## 2017-06-06 DIAGNOSIS — I10 Essential (primary) hypertension: Secondary | ICD-10-CM | POA: Diagnosis not present

## 2017-06-07 DIAGNOSIS — E1165 Type 2 diabetes mellitus with hyperglycemia: Secondary | ICD-10-CM | POA: Diagnosis not present

## 2017-06-07 DIAGNOSIS — Z8679 Personal history of other diseases of the circulatory system: Secondary | ICD-10-CM | POA: Diagnosis not present

## 2017-06-07 DIAGNOSIS — N280 Ischemia and infarction of kidney: Secondary | ICD-10-CM | POA: Diagnosis not present

## 2017-06-07 DIAGNOSIS — I10 Essential (primary) hypertension: Secondary | ICD-10-CM | POA: Diagnosis not present

## 2017-06-14 ENCOUNTER — Other Ambulatory Visit (HOSPITAL_COMMUNITY): Payer: Self-pay | Admitting: Urology

## 2017-06-14 DIAGNOSIS — B952 Enterococcus as the cause of diseases classified elsewhere: Secondary | ICD-10-CM | POA: Diagnosis not present

## 2017-06-14 DIAGNOSIS — D49511 Neoplasm of unspecified behavior of right kidney: Secondary | ICD-10-CM

## 2017-06-14 DIAGNOSIS — N390 Urinary tract infection, site not specified: Secondary | ICD-10-CM | POA: Diagnosis not present

## 2017-06-14 DIAGNOSIS — R8279 Other abnormal findings on microbiological examination of urine: Secondary | ICD-10-CM | POA: Diagnosis not present

## 2017-06-24 ENCOUNTER — Ambulatory Visit (HOSPITAL_COMMUNITY)
Admission: RE | Admit: 2017-06-24 | Discharge: 2017-06-24 | Disposition: A | Discharge status: Home / Self Care | Payer: BLUE CROSS/BLUE SHIELD | Source: Ambulatory Visit | Attending: Urology | Admitting: Urology

## 2017-06-24 DIAGNOSIS — I7101 Dissection of thoracic aorta: Secondary | ICD-10-CM | POA: Insufficient documentation

## 2017-06-24 DIAGNOSIS — N280 Ischemia and infarction of kidney: Secondary | ICD-10-CM | POA: Insufficient documentation

## 2017-06-24 DIAGNOSIS — I7102 Dissection of abdominal aorta: Secondary | ICD-10-CM | POA: Diagnosis not present

## 2017-06-24 DIAGNOSIS — D49511 Neoplasm of unspecified behavior of right kidney: Secondary | ICD-10-CM | POA: Diagnosis not present

## 2017-06-24 DIAGNOSIS — N281 Cyst of kidney, acquired: Secondary | ICD-10-CM | POA: Diagnosis not present

## 2017-06-24 MED ORDER — GADOBENATE DIMEGLUMINE 529 MG/ML IV SOLN
20.0000 mL | Freq: Once | INTRAVENOUS | Status: AC | PRN
  Administered 2017-06-24: 18 mL via INTRAVENOUS

## 2017-06-28 DIAGNOSIS — E119 Type 2 diabetes mellitus without complications: Secondary | ICD-10-CM | POA: Diagnosis not present

## 2017-06-30 DIAGNOSIS — R8271 Bacteriuria: Secondary | ICD-10-CM | POA: Diagnosis not present

## 2017-07-04 ENCOUNTER — Other Ambulatory Visit: Payer: Self-pay | Admitting: Urology

## 2017-07-25 ENCOUNTER — Other Ambulatory Visit: Payer: Self-pay

## 2017-07-25 ENCOUNTER — Encounter (HOSPITAL_COMMUNITY)
Admission: RE | Admit: 2017-07-25 | Discharge: 2017-07-25 | Disposition: A | Discharge status: Home / Self Care | Payer: BLUE CROSS/BLUE SHIELD | Source: Ambulatory Visit | Attending: Urology | Admitting: Urology

## 2017-07-25 ENCOUNTER — Encounter (HOSPITAL_COMMUNITY): Payer: Self-pay

## 2017-07-25 DIAGNOSIS — Z01812 Encounter for preprocedural laboratory examination: Secondary | ICD-10-CM | POA: Diagnosis not present

## 2017-07-25 DIAGNOSIS — D49511 Neoplasm of unspecified behavior of right kidney: Secondary | ICD-10-CM | POA: Insufficient documentation

## 2017-07-25 DIAGNOSIS — Z01810 Encounter for preprocedural cardiovascular examination: Secondary | ICD-10-CM | POA: Diagnosis not present

## 2017-07-25 HISTORY — DX: Pneumonia, unspecified organism: J18.9

## 2017-07-25 HISTORY — DX: Obstructive sleep apnea (adult) (pediatric): G47.33

## 2017-07-25 HISTORY — DX: Umbilical hernia without obstruction or gangrene: K42.9

## 2017-07-25 HISTORY — DX: Ventral hernia without obstruction or gangrene: K43.9

## 2017-07-25 HISTORY — DX: Other muscle spasm: M62.838

## 2017-07-25 HISTORY — DX: Other specified postprocedural states: Z98.890

## 2017-07-25 HISTORY — DX: Ischemia and infarction of kidney: N28.0

## 2017-07-25 HISTORY — DX: Unilateral inguinal hernia, without obstruction or gangrene, not specified as recurrent: K40.90

## 2017-07-25 HISTORY — DX: Anemia, unspecified: D64.9

## 2017-07-25 HISTORY — DX: Gastro-esophageal reflux disease without esophagitis: K21.9

## 2017-07-25 HISTORY — DX: Malignant neoplasm of right kidney, except renal pelvis: C64.1

## 2017-07-25 HISTORY — DX: Dissection of unspecified site of aorta: I71.00

## 2017-07-25 HISTORY — DX: Other specified disorders of kidney and ureter: N28.89

## 2017-07-25 HISTORY — DX: Personal history of other diseases of the digestive system: Z87.19

## 2017-07-25 LAB — CBC
HCT: 34 % — ABNORMAL LOW (ref 39.0–52.0)
Hemoglobin: 10.8 g/dL — ABNORMAL LOW (ref 13.0–17.0)
MCH: 29 pg (ref 26.0–34.0)
MCHC: 31.8 g/dL (ref 30.0–36.0)
MCV: 91.4 fL (ref 78.0–100.0)
PLATELETS: 234 10*3/uL (ref 150–400)
RBC: 3.72 MIL/uL — AB (ref 4.22–5.81)
RDW: 15.8 % — ABNORMAL HIGH (ref 11.5–15.5)
WBC: 9.4 10*3/uL (ref 4.0–10.5)

## 2017-07-25 LAB — BASIC METABOLIC PANEL
Anion gap: 8 (ref 5–15)
BUN: 26 mg/dL — ABNORMAL HIGH (ref 6–20)
CALCIUM: 8.9 mg/dL (ref 8.9–10.3)
CHLORIDE: 108 mmol/L (ref 101–111)
CO2: 23 mmol/L (ref 22–32)
CREATININE: 1.08 mg/dL (ref 0.61–1.24)
GFR calc Af Amer: >60 mL/min (ref >60)
GFR calc non Af Amer: >60 mL/min (ref >60)
GLUCOSE: 149 mg/dL — AB (ref 65–99)
Potassium: 4.5 mmol/L (ref 3.5–5.1)
Sodium: 139 mmol/L (ref 135–145)

## 2017-07-25 LAB — GLUCOSE, CAPILLARY: GLUCOSE-CAPILLARY: 151 mg/dL — AB (ref 65–99)

## 2017-07-25 LAB — ABO/RH: ABO/RH(D): A POS

## 2017-07-25 NOTE — Patient Instructions (Addendum)
Jerry Weiss  07/25/2017   Your procedure is scheduled on: 07-28-17  Report to [facility] Main  Entrance  Report to admitting at 845 AM    Call this number if you have problems the morning of surgery [phone]   Remember: Do not eat food or drink liquids :After Midnight.  How to Manage Your Diabetes Before and After Surgery  Why is it important to control my blood sugar before and after surgery? . Improving blood sugar levels before and after surgery helps healing and can limit problems. . A way of improving blood sugar control is eating a healthy diet by: o  Eating less sugar and carbohydrates o  Increasing activity/exercise o  Talking with your doctor about reaching your blood sugar goals . High blood sugars (greater than 180 mg/dL) can raise your risk of infections and slow your recovery, so you will need to focus on controlling your diabetes during the weeks before surgery. . Make sure that the doctor who takes care of your diabetes knows about your planned surgery including the date and location.  How do I manage my blood sugar before surgery? . Check your blood sugar at least 4 times a day, starting 2 days before surgery, to make sure that the level is not too high or low. o Check your blood sugar the morning of your surgery when you wake up and every 2 hours until you get to the Short Stay unit. . If your blood sugar is less than 70 mg/dL, you will need to treat for low blood sugar: o Do not take insulin. o Treat a low blood sugar (less than 70 mg/dL) with  cup of clear juice (cranberry or apple), 4 glucose tablets, OR glucose gel. o Recheck blood sugar in 15 minutes after treatment (to make sure it is greater than 70 mg/dL). If your blood sugar is not greater than 70 mg/dL on recheck, call [phone] for further instructions. . Report your blood sugar to the short stay nurse when you get to Short Stay.  . If you are admitted to the hospital after  surgery: o Your blood sugar will be checked by the staff and you will probably be given insulin after surgery (instead of oral diabetes medicines) to make sure you have good blood sugar levels. o The goal for blood sugar control after surgery is 80-180 mg/dL.   WHAT DO I DO ABOUT MY DIABETES MEDICATION?  Marland Kitchen Do not take oral diabetes medicines (pills) the morning of surgery.  . THE day before surgery 07-28-17 take your januvia as usual       . THE MORNING OF SURGERY 07-28-17, no diabetic meds   [place] - Preparing for Surgery Before surgery, you can play an important role.  Because skin is not sterile, your skin needs to be as free of germs as possible.  You can reduce the number of germs on your skin by washing with CHG (chlorahexidine gluconate) soap before surgery.  CHG is an antiseptic cleaner which kills germs and bonds with the skin to continue killing germs even after washing. Please DO NOT use if you have an allergy to CHG or antibacterial soaps.  If your skin becomes reddened/irritated stop using the CHG and inform your nurse when you arrive at Short Stay. Do not shave (including legs and underarms) for at least 48 hours prior to the first CHG shower.  You may  shave your face/neck. Please follow these instructions carefully:  1.  Shower with CHG Soap the night before surgery and the  morning of Surgery.  2.  If you choose to wash your hair, wash your hair first as usual with your  normal  shampoo.  3.  After you shampoo, rinse your hair and body thoroughly to remove the  shampoo.                           4.  Use CHG as you would any other liquid soap.  You can apply chg directly  to the skin and wash                       Gently with a scrungie or clean washcloth.  5.  Apply the CHG Soap to your body ONLY FROM THE NECK DOWN.   Do not use on face/ open                           Wound or open sores. Avoid contact with eyes, ears mouth and genitals (private parts).                        Wash face,  Genitals (private parts) with your normal soap.             6.  Wash thoroughly, paying special attention to the area where your surgery  will be performed.  7.  Thoroughly rinse your body with warm water from the neck down.  8.  DO NOT shower/wash with your normal soap after using and rinsing off  the CHG Soap.                9.  Pat yourself dry with a clean towel.            10.  Wear clean pajamas.            11.  Place clean sheets on your bed the night of your first shower and do not  sleep with pets. Day of Surgery : Do not apply any lotions/deodorants the morning of surgery.  Please wear clean clothes to the hospital/surgery center.  FAILURE TO FOLLOW THESE INSTRUCTIONS MAY RESULT IN THE CANCELLATION OF YOUR SURGERY PATIENT SIGNATURE_________________________________  NURSE SIGNATURE__________________________________  ________________________________________________________________________    Take these medicines the morning of surgery with A SIP OF WATER:  hydrazaline (apresaline), labetalol, pantaprazole (protonix)   DO NOT TAKE ANY DIABETIC MEDICATIONS DAY OF YOUR SURGERY                               You may not have any metal on your body including body piercings and jewelry     Do not wear  lotions, powders or cologne, deodorant                          Men may shave face and neck.   Do not bring valuables to the hospital. [place].   Contacts, dentures or bridgework may not be worn into surgery.   Leave suitcase in the car. After surgery it may be brought to your room.  Please read over the following fact sheets you were given: _____________________________________________________________________

## 2017-07-25 NOTE — Progress Notes (Signed)
Chest ct 05-26-17 epic

## 2017-07-25 NOTE — Pre-Procedure Instructions (Signed)
Hgb A 1 C (6.6) on 06/06/2017 in initial note 06/28/2017 in careeverywhere

## 2017-07-25 NOTE — Progress Notes (Signed)
BMP results 07/25/2017 faxed to Dr. Alinda Money via epic.

## 2017-07-26 ENCOUNTER — Encounter (HOSPITAL_COMMUNITY): Payer: Self-pay

## 2017-07-26 DIAGNOSIS — Z7984 Long term (current) use of oral hypoglycemic drugs: Secondary | ICD-10-CM | POA: Diagnosis not present

## 2017-07-26 DIAGNOSIS — E119 Type 2 diabetes mellitus without complications: Secondary | ICD-10-CM | POA: Diagnosis not present

## 2017-07-26 NOTE — Pre-Procedure Instructions (Signed)
CBC results 07/25/2017 faxed to Dr. Alinda Money via epic.

## 2017-07-26 NOTE — Pre-Procedure Instructions (Signed)
Dr. Tobias Alexander reviewed CT angio chest aorta imaging report 05/2017 on chart, also made aware of BUN 26, no new orders received at this time.

## 2017-07-27 NOTE — H&P (Signed)
Office Visit Report     06/14/2017   --------------------------------------------------------------------------------   Jerry Weiss  MRN: [id number]  PRIMARY CARE:  Gus Height, MD  DOB: 07-20-1955, 62 year old Male  REFERRING:    SSN:-[id number]  PROVIDER:  Irine Seal, M.D.    TREATING:  Raynelle Bring, M.D.    LOCATION:  [facility] 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 Irine Seal for consideration of minimally invasive nephron sparing surgery for right renal neoplasms. He has been followed by Dr. Jeffie Pollock in the past for an elevated PSA and he is status post a negative prostate biopsy in 2014. His baseline PSA levels have been between 3 and 4. In addition, he does have BPH and lower urinary tract symptoms that are managed with tamsulosin 0.4 mg nightly.   A few months ago, he was visiting family in [place] when he developed acute nausea associated with severe back pain. 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 He was also noted to have thrombosed his left kidney. Incidentally, he was also found to have a renal mass on the right kidney. He remained hospitalized with strict blood pressure control and eventually was discharged. He has since followed up with vascular surgery in [facility] and has seen Dr. Servando Snare. Currently, he is undergoing observation. He is currently on for antihypertensives including amlodipine, hydralazine, labetalol, and lisinopril. He recently underwent a CT angiogram of the chest and abdomen. This confirmed a large, Type B aortic dissection. His left kidney was noted be completely thrombosed without any vascular flow. His right kidney  demonstrates 2 renal masses including a mass measuring 3.3 cm that is mostly exophytic off the upper pole of the right kidney along with a very small sub cm mass off the lateral aspect of the lower pole of the right kidney. These both appear enhancing on his CT scan although no unenhanced images of the abdomen are available for complete confirmation. No worrisome pulmonary nodules or other evidence of metastatic disease is present. No regional lymphadenopathy is noted. He has denied any hematuria, has no family history of kidney cancer, and his renal function fortunately has remained relatively stable despite his left renal artery thrombosis. Serum creatinine is 1.16 most recently.   In addition, he does have an umbilical hernia that has been bothersome to him and causing some pain and discomfort. He feels that this has been getting worse. He was noted to have only fat within this hernia on his recent imaging.   His medical history is significant for diabetes, hypertension, BPH. His past surgical history is significant for bilateral inguinal hernia repairs.     ALLERGIES: Codeine Derivatives shell fish    MEDICATIONS: Lisinopril  Tamsulosin Hcl  Amlodipine Besylate  Aspirin Ec 81 mg tablet, delayed release  Hydralazine Hcl  Januvia  Labetalol Hcl  Multi-Vitamin TABS Oral  Probiotic     GU PSH: Prostate Needle Biopsy - about 2014      Hines Notes: Hernia Repair, endoscopy 07-2016 --hiatal hernia  aortic dissection     NON-GU PSH: Hernia Repair - 2011    GU PMH: Right uncertain neoplasm of kidney, He has a 3.3cm RUP mass and an 55m RLP mass which are suspicious for neoplasms.  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 I am going to get him set up to see one of our renal surgeons for further evaluation and management. - 06/02/2017 BPH w/LUTS (Stable), He has stable voiding symptoms on alfuzosin. I have refilled the med. He will return in a year. - 09/08/2016, - 09/08/2015, Benign prostatic hyperplasia  with urinary obstruction, - 2016 Elevated PSA, Elevated prostate specific antigen (PSA) - 2016 Urinary Urgency, Urinary urgency - 2016 Nocturia, Nocturia - 2014 Testicular atrophy, Testicular Atrophy - 2014 Testicular pain, unspecified, Testicular Pain - 2014 Urinary Frequency, Increased urinary frequency - 2014    NON-GU PMH: Diabetes Type 2 Dissection of thoracoabdominal aorta GERD Hypertension Sleep Apnea    FAMILY HISTORY: Death In The Family Father - Runs In Family Family Health Status - Mother's Age - Runs In Family Family Health Status Number - Runs In Family sudden death in father - Runs In Family   SOCIAL HISTORY: Marital Status: Married Preferred Language: English; Ethnicity: Not Hispanic Or Latino; Race: White Current Smoking Status: Patient has never smoked.  Has never drank.  Does not drink caffeine. Has not had a blood transfusion. Patient's occupation Gaffer.     Notes: Widowed, Never A Smoker, Occupation:, Tobacco Use, Marital History - Currently Married, Alcohol Use, Caffeine Use   REVIEW OF SYSTEMS:    GU Review Male:   Patient reports get up at night to urinate. Patient denies frequent urination, hard to postpone urination, burning/ pain with urination, leakage of urine, stream starts and stops, trouble starting your streams, and have to strain to urinate .  Gastrointestinal (Lower):   Patient denies diarrhea and constipation.  Gastrointestinal (Upper):   Patient denies nausea and vomiting.  Constitutional:   Patient denies fever, night sweats, weight loss, and fatigue.  Skin:   Patient denies skin rash/ lesion and itching.  Eyes:   Patient denies blurred vision and double vision.  Ears/ Nose/ Throat:   Patient denies sore throat and sinus problems.  Hematologic/Lymphatic:   Patient denies swollen glands and easy bruising.  Cardiovascular:   Patient denies leg swelling and chest pains.  Respiratory:   Patient denies cough and shortness of breath.   Endocrine:   Patient denies excessive thirst.  Musculoskeletal:   Patient denies back pain and joint pain.  Neurological:   Patient denies headaches and dizziness.  Psychologic:   Patient denies depression and anxiety.   VITAL SIGNS:      06/14/2017 03:04 PM  Weight 192 lb / 87.09 kg  Height 67 in / 170.18 cm  BP 110/67 mmHg  Pulse 69 /min  Temperature 98.2 F / 36.7 C  BMI 30.1 kg/m   MULTI-SYSTEM PHYSICAL EXAMINATION:    Constitutional: Well-nourished. No physical deformities. Normally developed. Good grooming.  Neck: Neck symmetrical, not swollen. Normal tracheal position.  Respiratory: No labored breathing, no use of accessory muscles. Normal breath sounds. Clear bilaterally.  Cardiovascular: Regular rate and rhythm. No murmur, no gallop. Normal temperature, normal extremity pulses, no swelling, no varicosities.  Lymphatic: No enlargement of neck, axillae, groin.  Skin: No paleness, no jaundice, no cyanosis. No lesion, no ulcer, no rash.  Neurologic / Psychiatric: Oriented to time, oriented to place, oriented to person. No depression, no anxiety, no agitation.  Gastrointestinal: Soft, nontender, nondistended. He does have a ventral hernia in the supraumbilical region. There is a soft and mobile fatty mass in the hernia. I cannot completely reduce this.  Eyes: Normal conjunctivae. Normal eyelids.  Ears, Nose, Mouth, and Throat:  Left ear no scars, no lesions, no masses. Right ear no scars, no lesions, no masses. Nose no scars, no lesions, no masses. Normal hearing. Normal lips.  Musculoskeletal: Normal gait and station of head and neck.     PAST DATA REVIEWED:  Source Of History:  Patient  Lab Test Review:   PSA  Records Review:   Previous Patient Records  Urine Test Review:   Urinalysis  X-Ray Review: C.T. Chest: Reviewed Films.     09/01/16 09/02/15 08/29/14 08/28/13 12/26/12 11/23/12 06/17/10  PSA  Total PSA 3.28 ng/mL 3.17  3.10  2.98  3.94  3.18  2.01   Free PSA 0.55  ng/mL    0.61     % Free PSA 17 % PSA    15       PROCEDURES:          Urinalysis Dipstick Dipstick Cont'd Micro  Color: Yellow Bilirubin: Neg WBC/hpf: >60/hpf  Appearance: Cloudy Ketones: Trace RBC/hpf: NS (Not Seen)  Specific Gravity: <=1.005 Blood: Neg Bacteria: Rare (0-9/hpf)  pH: 5.5 Protein: 2+ Cystals: NS (Not Seen)  Glucose: Neg Urobilinogen: 0.2 Casts: NS (Not Seen)    Nitrites: Neg Trichomonas: Not Present    Leukocyte Esterase: 3+ Mucous: Not Present      Epithelial Cells: NS (Not Seen)      Yeast: NS (Not Seen)      Sperm: Not Present    ASSESSMENT:      ICD-10 Details  1 GU:   Right renal neoplasm - D49.511   2 NON-GU:   Pyuria/other UA findings - R82.79    PLAN:           Orders Labs CBC with Diff, CMP, Urine Culture  X-Ray Notes: ...          Schedule X-Rays: 1 Week - MRI Abdomen With and Without I.V. Contrast  Return Visit/Planned Activity: Other See Visit Notes             Note: Will call to schedule surgery          Document Letter(s):  Created for Patient: Clinical Summary         Notes:   1. Right renal neoplasm concerning for possible malignancy: He will proceed with dedicated renal imaging with and without IV contrast. Considering his recent multiple CT scans, and I will obtain an MRI to reduce his radiation exposure. This will help to confirm the concern about an enhancing mass and to better define any concern for metastatic disease. He also will have laboratory studies today.   We discussed his situation in detail. He has an unusual and somewhat unfortunate situation that he has loss complete function of his left kidney due to his renal artery thrombosis. He therefore basically has a functional solitary right kidney with 2 renal masses that are potentially concerning for renal malignancy. The patient was provided information regarding their renal mass including the relative risk of benign versus malignant pathology and the natural history of renal  cell carcinoma and other possible malignancies of the kidney. The role of renal biopsy, laboratory testing, and imaging studies to further characterize renal masses and/or the presence of metastatic disease were explained. We discussed the role of active surveillance, surgical therapy with both radical nephrectomy and nephron-sparing surgery, and ablative therapy in the treatment of renal masses. In addition, we discussed our goals of providing an accurate diagnosis and oncologic control while maintaining optimal renal function as appropriate based on the size, location, and complexity  of their renal mass as well as their co-morbidities.   We have discussed the risks of treatment in detail including but not limited to bleeding, infection, heart attack, stroke, death, venothromoboembolism, cancer recurrence, injury/damage to surrounding organs and structures, urine leak, the possibility of open surgical conversion for patients undergoing minimally invasive surgery, the risk of developing chronic kidney disease and its associated implications, and the potential risk of end stage renal disease possibly necessitating dialysis.   With a solitary kidney, he does understand the increased risk of potentially developing worsening renal function or even requiring dialysis in the worst case scenario. I have recommended that he proceed with treatment with a right robot assisted laparoscopic partial nephrectomy. Selective segmental arterial clamping will be utilized if feasible to avoid global renal ischemia.   Finally, we discussed some of the perioperative issues and I have made an attempt to contact Dr. Donzetta Matters today and will await his responses. This includes whether waiting a period time would be beneficial before proceeding with operative intervention. It is unclear to me whether there is any advantage to wait any further as long as his blood pressure is well controlled. Jerry Weiss is aware that surgery may result in  some blood pressure fluctuations that may place him at an increased risk of further complications related to his dissection. He is currently on aspirin 81 mg, and I will confirm with Dr. Donzetta Matters as to the risk/benefit of continuing aspirin 81 mg in the perioperative setting.   2. Ventral hernia: He is potentially interested in having his hernia addressed at the time of his partial nephrectomy. He understands that this may result in increased postoperative pain which may make it somewhat more difficult to control his blood pressure. After discussion with Dr. Donzetta Matters, if appropriate, I will consider having him see General surgery and potentially performing this as a concomitant procedure.   3. Elevated PSA: This will continue to be monitored and can be followed up after treatment of his renal masses with Dr. Jeffie Pollock.   4. BPH/LUTS: Continue tamsulosin 0.4 mg nightly.   5. Pyuria: This is likely a result of his left renal infarction. Urine culture has been obtained.   Cc: Dr. Gus Height  Dr. Irine Seal  Dr. Servando Snare     APPENDED NOTES:  I spoke with Dr. Donzetta Matters. He felt that it would be reasonable to proceed with surgery and that his aortic dissection should be stable at this point. He did recommend appropriate blood pressure control. He did feel though it would be reasonable for him to be off 81 mg of aspirin perioperatively with relatively low risk. Finally, we discussed the pros and cons of proceeding with a ventral hernia repair. He did not have a strong opinion about this. However, considering that this would add to his postoperative pain and may cause significant hypertension as a result, I do feel that would be wisest to avoid a concomitant hernia repair at this time. Final recommendations will be pending his MRI result.     * Signed by Raynelle Bring, M.D. on 06/28/17 at 5:53 PM (EDT)*

## 2017-07-28 ENCOUNTER — Encounter (HOSPITAL_COMMUNITY): Payer: Self-pay | Admitting: *Deleted

## 2017-07-28 ENCOUNTER — Other Ambulatory Visit: Payer: Self-pay

## 2017-07-28 ENCOUNTER — Encounter (HOSPITAL_COMMUNITY)
Admission: RE | Disposition: A | Discharge status: Home / Self Care | Payer: Self-pay | Source: Ambulatory Visit | Attending: Urology

## 2017-07-28 ENCOUNTER — Ambulatory Visit (HOSPITAL_COMMUNITY): Payer: BLUE CROSS/BLUE SHIELD | Admitting: Anesthesiology

## 2017-07-28 ENCOUNTER — Observation Stay (HOSPITAL_COMMUNITY)
Admission: RE | Admit: 2017-07-28 | Discharge: 2017-07-31 | Disposition: A | Discharge status: Home / Self Care | Payer: BLUE CROSS/BLUE SHIELD | Source: Ambulatory Visit | Attending: Urology | Admitting: Urology

## 2017-07-28 DIAGNOSIS — I7101 Dissection of thoracic aorta: Secondary | ICD-10-CM | POA: Diagnosis not present

## 2017-07-28 DIAGNOSIS — Z885 Allergy status to narcotic agent status: Secondary | ICD-10-CM | POA: Diagnosis not present

## 2017-07-28 DIAGNOSIS — K429 Umbilical hernia without obstruction or gangrene: Secondary | ICD-10-CM | POA: Insufficient documentation

## 2017-07-28 DIAGNOSIS — N179 Acute kidney failure, unspecified: Secondary | ICD-10-CM | POA: Diagnosis not present

## 2017-07-28 DIAGNOSIS — K439 Ventral hernia without obstruction or gangrene: Secondary | ICD-10-CM | POA: Insufficient documentation

## 2017-07-28 DIAGNOSIS — Z91013 Allergy to seafood: Secondary | ICD-10-CM | POA: Diagnosis not present

## 2017-07-28 DIAGNOSIS — N280 Ischemia and infarction of kidney: Secondary | ICD-10-CM | POA: Diagnosis not present

## 2017-07-28 DIAGNOSIS — Z7982 Long term (current) use of aspirin: Secondary | ICD-10-CM | POA: Insufficient documentation

## 2017-07-28 DIAGNOSIS — N401 Enlarged prostate with lower urinary tract symptoms: Secondary | ICD-10-CM | POA: Diagnosis not present

## 2017-07-28 DIAGNOSIS — E1122 Type 2 diabetes mellitus with diabetic chronic kidney disease: Secondary | ICD-10-CM | POA: Insufficient documentation

## 2017-07-28 DIAGNOSIS — C641 Malignant neoplasm of right kidney, except renal pelvis: Principal | ICD-10-CM | POA: Insufficient documentation

## 2017-07-28 DIAGNOSIS — N183 Chronic kidney disease, stage 3 (moderate): Secondary | ICD-10-CM | POA: Insufficient documentation

## 2017-07-28 DIAGNOSIS — G4733 Obstructive sleep apnea (adult) (pediatric): Secondary | ICD-10-CM | POA: Insufficient documentation

## 2017-07-28 DIAGNOSIS — D49511 Neoplasm of unspecified behavior of right kidney: Secondary | ICD-10-CM | POA: Diagnosis not present

## 2017-07-28 DIAGNOSIS — I10 Essential (primary) hypertension: Secondary | ICD-10-CM | POA: Diagnosis not present

## 2017-07-28 DIAGNOSIS — E119 Type 2 diabetes mellitus without complications: Secondary | ICD-10-CM | POA: Diagnosis not present

## 2017-07-28 DIAGNOSIS — K219 Gastro-esophageal reflux disease without esophagitis: Secondary | ICD-10-CM | POA: Insufficient documentation

## 2017-07-28 DIAGNOSIS — D49519 Neoplasm of unspecified behavior of unspecified kidney: Secondary | ICD-10-CM | POA: Diagnosis present

## 2017-07-28 DIAGNOSIS — I129 Hypertensive chronic kidney disease with stage 1 through stage 4 chronic kidney disease, or unspecified chronic kidney disease: Secondary | ICD-10-CM | POA: Insufficient documentation

## 2017-07-28 DIAGNOSIS — Z7951 Long term (current) use of inhaled steroids: Secondary | ICD-10-CM | POA: Insufficient documentation

## 2017-07-28 DIAGNOSIS — Z7984 Long term (current) use of oral hypoglycemic drugs: Secondary | ICD-10-CM | POA: Diagnosis not present

## 2017-07-28 DIAGNOSIS — Z79899 Other long term (current) drug therapy: Secondary | ICD-10-CM | POA: Diagnosis not present

## 2017-07-28 HISTORY — PX: ROBOT ASSISTED LAPAROSCOPIC NEPHRECTOMY: SHX5140

## 2017-07-28 LAB — TYPE AND SCREEN
ABO/RH(D): A POS
Antibody Screen: NEGATIVE

## 2017-07-28 LAB — BASIC METABOLIC PANEL
Anion gap: 9 (ref 5–15)
BUN: 26 mg/dL — AB (ref 6–20)
CO2: 23 mmol/L (ref 22–32)
CREATININE: 1.43 mg/dL — AB (ref 0.61–1.24)
Calcium: 9 mg/dL (ref 8.9–10.3)
Chloride: 108 mmol/L (ref 101–111)
GFR calc Af Amer: 59 mL/min — ABNORMAL LOW (ref >60)
GFR calc non Af Amer: 51 mL/min — ABNORMAL LOW (ref >60)
Glucose, Bld: 159 mg/dL — ABNORMAL HIGH (ref 65–99)
POTASSIUM: 4.3 mmol/L (ref 3.5–5.1)
Sodium: 140 mmol/L (ref 135–145)

## 2017-07-28 LAB — GLUCOSE, CAPILLARY
GLUCOSE-CAPILLARY: 134 mg/dL — AB (ref 65–99)
GLUCOSE-CAPILLARY: 239 mg/dL — AB (ref 65–99)
Glucose-Capillary: 160 mg/dL — ABNORMAL HIGH (ref 65–99)

## 2017-07-28 LAB — HEMOGLOBIN AND HEMATOCRIT, BLOOD
HCT: 33.1 % — ABNORMAL LOW (ref 39.0–52.0)
HEMOGLOBIN: 10.7 g/dL — AB (ref 13.0–17.0)

## 2017-07-28 SURGERY — NEPHRECTOMY, RADICAL, ROBOT-ASSISTED, LAPAROSCOPIC, ADULT
Anesthesia: General | Site: Abdomen | Laterality: Right

## 2017-07-28 MED ORDER — MORPHINE SULFATE (PF) 4 MG/ML IV SOLN
2.0000 mg | INTRAVENOUS | Status: DC | PRN
  Administered 2017-07-28: 4 mg via INTRAVENOUS
  Filled 2017-07-28: qty 1

## 2017-07-28 MED ORDER — ACETAMINOPHEN 10 MG/ML IV SOLN
1000.0000 mg | Freq: Four times a day (QID) | INTRAVENOUS | Status: DC
  Administered 2017-07-28 – 2017-07-29 (×3): 1000 mg via INTRAVENOUS
  Filled 2017-07-28 (×3): qty 100

## 2017-07-28 MED ORDER — DIPHENHYDRAMINE HCL 50 MG/ML IJ SOLN: 12.5000 mg | Freq: Four times a day (QID) | INTRAMUSCULAR | Status: DC | PRN

## 2017-07-28 MED ORDER — LIDOCAINE 2% (20 MG/ML) 5 ML SYRINGE
INTRAMUSCULAR | Status: AC
  Filled 2017-07-28: qty 5

## 2017-07-28 MED ORDER — LABETALOL HCL 200 MG PO TABS
400.0000 mg | ORAL_TABLET | Freq: Three times a day (TID) | ORAL | Status: DC
  Administered 2017-07-28 – 2017-07-31 (×10): 400 mg via ORAL
  Filled 2017-07-28 (×10): qty 2

## 2017-07-28 MED ORDER — BUPIVACAINE-EPINEPHRINE (PF) 0.5% -1:200000 IJ SOLN
INTRAMUSCULAR | Status: AC
  Filled 2017-07-28: qty 30

## 2017-07-28 MED ORDER — TAMSULOSIN HCL 0.4 MG PO CAPS
0.8000 mg | ORAL_CAPSULE | Freq: Every day | ORAL | Status: DC
  Administered 2017-07-28 – 2017-07-30 (×3): 0.8 mg via ORAL
  Filled 2017-07-28 (×3): qty 2

## 2017-07-28 MED ORDER — LACTATED RINGERS IV SOLN
INTRAVENOUS | Status: DC
  Administered 2017-07-28 (×2): via INTRAVENOUS

## 2017-07-28 MED ORDER — LACTATED RINGERS IV SOLN
INTRAVENOUS | Status: DC | PRN
  Administered 2017-07-28 (×3): via INTRAVENOUS

## 2017-07-28 MED ORDER — BUPIVACAINE LIPOSOME 1.3 % IJ SUSP
20.0000 mL | Freq: Once | INTRAMUSCULAR | Status: AC
  Administered 2017-07-28: 20 mL
  Filled 2017-07-28: qty 20

## 2017-07-28 MED ORDER — ONDANSETRON HCL 4 MG/2ML IJ SOLN: 4.0000 mg | INTRAMUSCULAR | Status: DC | PRN

## 2017-07-28 MED ORDER — PROPOFOL 10 MG/ML IV BOLUS
INTRAVENOUS | Status: DC | PRN
  Administered 2017-07-28: 200 mg via INTRAVENOUS

## 2017-07-28 MED ORDER — INSULIN ASPART 100 UNIT/ML ~~LOC~~ SOLN
0.0000 [IU] | SUBCUTANEOUS | Status: DC
  Administered 2017-07-28: 5 [IU] via SUBCUTANEOUS
  Administered 2017-07-28 – 2017-07-29 (×3): 3 [IU] via SUBCUTANEOUS
  Administered 2017-07-29: 5 [IU] via SUBCUTANEOUS
  Administered 2017-07-29: 8 [IU] via SUBCUTANEOUS
  Administered 2017-07-29: 3 [IU] via SUBCUTANEOUS
  Administered 2017-07-29: 5 [IU] via SUBCUTANEOUS
  Administered 2017-07-30: 2 [IU] via SUBCUTANEOUS
  Administered 2017-07-30 (×2): 3 [IU] via SUBCUTANEOUS
  Administered 2017-07-30: 2 [IU] via SUBCUTANEOUS
  Administered 2017-07-30: 3 [IU] via SUBCUTANEOUS

## 2017-07-28 MED ORDER — SODIUM CHLORIDE 0.9 % IJ SOLN
INTRAMUSCULAR | Status: AC
  Filled 2017-07-28: qty 10

## 2017-07-28 MED ORDER — HYDROMORPHONE HCL 1 MG/ML IJ SOLN
INTRAMUSCULAR | Status: AC
  Filled 2017-07-28: qty 1

## 2017-07-28 MED ORDER — SUGAMMADEX SODIUM 200 MG/2ML IV SOLN
INTRAVENOUS | Status: DC | PRN
  Administered 2017-07-28: 200 mg via INTRAVENOUS

## 2017-07-28 MED ORDER — HYDROCODONE-ACETAMINOPHEN 5-325 MG PO TABS: 1.0000 | ORAL_TABLET | Freq: Four times a day (QID) | ORAL | 0 refills | Status: DC | PRN

## 2017-07-28 MED ORDER — EPHEDRINE SULFATE 50 MG/ML IJ SOLN
INTRAMUSCULAR | Status: DC | PRN
  Administered 2017-07-28: 2.5 mg via INTRAVENOUS
  Administered 2017-07-28: 5 mg via INTRAVENOUS
  Administered 2017-07-28: 2.5 mg via INTRAVENOUS

## 2017-07-28 MED ORDER — ONDANSETRON HCL 4 MG/2ML IJ SOLN
INTRAMUSCULAR | Status: DC | PRN
  Administered 2017-07-28: 4 mg via INTRAVENOUS

## 2017-07-28 MED ORDER — HEPARIN SODIUM (PORCINE) 1000 UNIT/ML IJ SOLN
INTRAMUSCULAR | Status: AC
  Filled 2017-07-28: qty 1

## 2017-07-28 MED ORDER — MIDAZOLAM HCL 2 MG/2ML IJ SOLN
INTRAMUSCULAR | Status: AC
  Filled 2017-07-28: qty 2

## 2017-07-28 MED ORDER — MANNITOL 25 % IV SOLN
25.0000 g | Freq: Once | INTRAVENOUS | Status: AC
  Administered 2017-07-28 (×2): 12.5 g via INTRAVENOUS
  Filled 2017-07-28: qty 100

## 2017-07-28 MED ORDER — FENTANYL CITRATE (PF) 250 MCG/5ML IJ SOLN
INTRAMUSCULAR | Status: AC
  Filled 2017-07-28: qty 5

## 2017-07-28 MED ORDER — GABAPENTIN 300 MG PO CAPS
300.0000 mg | ORAL_CAPSULE | Freq: Every day | ORAL | Status: DC
  Administered 2017-07-28 – 2017-07-30 (×3): 300 mg via ORAL
  Filled 2017-07-28 (×3): qty 1

## 2017-07-28 MED ORDER — CEFAZOLIN SODIUM-DEXTROSE 2-4 GM/100ML-% IV SOLN
2.0000 g | Freq: Once | INTRAVENOUS | Status: AC
  Administered 2017-07-28: 2 g via INTRAVENOUS
  Filled 2017-07-28: qty 100

## 2017-07-28 MED ORDER — PROMETHAZINE HCL 25 MG/ML IJ SOLN: 6.2500 mg | INTRAMUSCULAR | Status: DC | PRN

## 2017-07-28 MED ORDER — DEXAMETHASONE SODIUM PHOSPHATE 10 MG/ML IJ SOLN
INTRAMUSCULAR | Status: AC
  Filled 2017-07-28: qty 1

## 2017-07-28 MED ORDER — CEFAZOLIN SODIUM-DEXTROSE 1-4 GM/50ML-% IV SOLN
1.0000 g | Freq: Three times a day (TID) | INTRAVENOUS | Status: AC
  Administered 2017-07-28 – 2017-07-29 (×2): 1 g via INTRAVENOUS
  Filled 2017-07-28 (×2): qty 50

## 2017-07-28 MED ORDER — ROCURONIUM BROMIDE 10 MG/ML (PF) SYRINGE
PREFILLED_SYRINGE | INTRAVENOUS | Status: AC
  Filled 2017-07-28: qty 5

## 2017-07-28 MED ORDER — DEXAMETHASONE SODIUM PHOSPHATE 10 MG/ML IJ SOLN
INTRAMUSCULAR | Status: DC | PRN
  Administered 2017-07-28: 10 mg via INTRAVENOUS

## 2017-07-28 MED ORDER — DOCUSATE SODIUM 100 MG PO CAPS
100.0000 mg | ORAL_CAPSULE | Freq: Two times a day (BID) | ORAL | Status: DC
  Administered 2017-07-28 – 2017-07-30 (×5): 100 mg via ORAL
  Filled 2017-07-28 (×5): qty 1

## 2017-07-28 MED ORDER — EPHEDRINE 5 MG/ML INJ
INTRAVENOUS | Status: AC
  Filled 2017-07-28: qty 10

## 2017-07-28 MED ORDER — LACTATED RINGERS IR SOLN
Status: DC | PRN
  Administered 2017-07-28: 1000 mL

## 2017-07-28 MED ORDER — FENTANYL CITRATE (PF) 100 MCG/2ML IJ SOLN
INTRAMUSCULAR | Status: DC | PRN
  Administered 2017-07-28: 50 ug via INTRAVENOUS
  Administered 2017-07-28: 150 ug via INTRAVENOUS

## 2017-07-28 MED ORDER — STERILE WATER FOR IRRIGATION IR SOLN
Status: DC | PRN
  Administered 2017-07-28: 1000 mL

## 2017-07-28 MED ORDER — DEXTROSE-NACL 5-0.45 % IV SOLN
INTRAVENOUS | Status: DC
  Administered 2017-07-28 – 2017-07-29 (×2): via INTRAVENOUS

## 2017-07-28 MED ORDER — LIDOCAINE HCL (CARDIAC) PF 100 MG/5ML IV SOSY
PREFILLED_SYRINGE | INTRAVENOUS | Status: DC | PRN
  Administered 2017-07-28: 60 mg via INTRAVENOUS

## 2017-07-28 MED ORDER — AMLODIPINE BESYLATE 10 MG PO TABS
10.0000 mg | ORAL_TABLET | Freq: Every day | ORAL | Status: DC
  Administered 2017-07-28 – 2017-07-31 (×4): 10 mg via ORAL
  Filled 2017-07-28 (×4): qty 1

## 2017-07-28 MED ORDER — ALBUMIN HUMAN 5 % IV SOLN
INTRAVENOUS | Status: DC | PRN
  Administered 2017-07-28 (×2): via INTRAVENOUS

## 2017-07-28 MED ORDER — LISINOPRIL 20 MG PO TABS
40.0000 mg | ORAL_TABLET | Freq: Every day | ORAL | Status: DC
  Administered 2017-07-28 – 2017-07-31 (×4): 40 mg via ORAL
  Filled 2017-07-28 (×4): qty 2

## 2017-07-28 MED ORDER — PANTOPRAZOLE SODIUM 20 MG PO TBEC
20.0000 mg | DELAYED_RELEASE_TABLET | Freq: Every day | ORAL | Status: DC
  Administered 2017-07-29 – 2017-07-31 (×3): 20 mg via ORAL
  Filled 2017-07-28 (×3): qty 1

## 2017-07-28 MED ORDER — DIPHENHYDRAMINE HCL 12.5 MG/5ML PO ELIX: 12.5000 mg | ORAL_SOLUTION | Freq: Four times a day (QID) | ORAL | Status: DC | PRN

## 2017-07-28 MED ORDER — SUGAMMADEX SODIUM 200 MG/2ML IV SOLN
INTRAVENOUS | Status: AC
  Filled 2017-07-28: qty 2

## 2017-07-28 MED ORDER — LORATADINE 10 MG PO TABS
10.0000 mg | ORAL_TABLET | Freq: Every day | ORAL | Status: DC
  Filled 2017-07-28 (×3): qty 1

## 2017-07-28 MED ORDER — ROCURONIUM BROMIDE 100 MG/10ML IV SOLN
INTRAVENOUS | Status: DC | PRN
  Administered 2017-07-28: 50 mg via INTRAVENOUS
  Administered 2017-07-28 (×2): 20 mg via INTRAVENOUS
  Administered 2017-07-28 (×2): 10 mg via INTRAVENOUS

## 2017-07-28 MED ORDER — NEOMYCIN-POLYMYXIN-DEXAMETH 0.1 % OP OINT: 1.0000 "application " | TOPICAL_OINTMENT | Freq: Every day | OPHTHALMIC | Status: DC | PRN

## 2017-07-28 MED ORDER — ONDANSETRON HCL 4 MG/2ML IJ SOLN
INTRAMUSCULAR | Status: AC
  Filled 2017-07-28: qty 2

## 2017-07-28 MED ORDER — HEMOSTATIC AGENTS (NO CHARGE) OPTIME
TOPICAL | Status: DC | PRN
  Administered 2017-07-28: 1 via TOPICAL

## 2017-07-28 MED ORDER — INDOCYANINE GREEN 25 MG IV SOLR
INTRAVENOUS | Status: DC | PRN
  Administered 2017-07-28: 7.5 mg via INTRAVENOUS

## 2017-07-28 MED ORDER — FLUTICASONE PROPIONATE 50 MCG/ACT NA SUSP: 2.0000 | Freq: Every day | NASAL | Status: DC | PRN

## 2017-07-28 MED ORDER — HYDRALAZINE HCL 50 MG PO TABS
100.0000 mg | ORAL_TABLET | Freq: Three times a day (TID) | ORAL | Status: DC
  Administered 2017-07-28 – 2017-07-31 (×10): 100 mg via ORAL
  Filled 2017-07-28 (×10): qty 2

## 2017-07-28 MED ORDER — SODIUM CHLORIDE 0.9 % IJ SOLN
INTRAMUSCULAR | Status: DC | PRN
  Administered 2017-07-28: 20 mL

## 2017-07-28 MED ORDER — MIDAZOLAM HCL 5 MG/5ML IJ SOLN
INTRAMUSCULAR | Status: DC | PRN
  Administered 2017-07-28: 2 mg via INTRAVENOUS

## 2017-07-28 MED ORDER — ALBUMIN HUMAN 5 % IV SOLN
INTRAVENOUS | Status: AC
  Filled 2017-07-28: qty 250

## 2017-07-28 MED ORDER — HYDROMORPHONE HCL 1 MG/ML IJ SOLN
0.2500 mg | INTRAMUSCULAR | Status: DC | PRN
  Administered 2017-07-28 (×2): 0.5 mg via INTRAVENOUS

## 2017-07-28 MED ORDER — PROPOFOL 10 MG/ML IV BOLUS
INTRAVENOUS | Status: AC
  Filled 2017-07-28: qty 20

## 2017-07-28 SURGICAL SUPPLY — 51 items
APPLICATOR SURGIFLO ENDO (HEMOSTASIS) ×2 IMPLANT
CHLORAPREP W/TINT 26ML (MISCELLANEOUS) ×2 IMPLANT
CLIP VESOLOCK LG 6/CT PURPLE (CLIP) ×2 IMPLANT
CLIP VESOLOCK MED LG 6/CT (CLIP) ×10 IMPLANT
COVER SURGICAL LIGHT HANDLE (MISCELLANEOUS) ×2 IMPLANT
COVER TIP SHEARS 8 DVNC (MISCELLANEOUS) ×2 IMPLANT
COVER TIP SHEARS 8MM DA VINCI (MISCELLANEOUS) ×2
DECANTER SPIKE VIAL GLASS SM (MISCELLANEOUS) IMPLANT
DERMABOND ADVANCED (GAUZE/BANDAGES/DRESSINGS) ×1
DERMABOND ADVANCED .7 DNX12 (GAUZE/BANDAGES/DRESSINGS) ×1 IMPLANT
DRAIN CHANNEL 15F RND FF 3/16 (WOUND CARE) ×2 IMPLANT
DRAPE ARM DVNC X/XI (DISPOSABLE) ×4 IMPLANT
DRAPE COLUMN DVNC XI (DISPOSABLE) ×1 IMPLANT
DRAPE DA VINCI XI ARM (DISPOSABLE) ×4
DRAPE DA VINCI XI COLUMN (DISPOSABLE) ×1
DRAPE INCISE IOBAN 66X45 STRL (DRAPES) ×2 IMPLANT
DRAPE SHEET LG 3/4 BI-LAMINATE (DRAPES) ×2 IMPLANT
ELECT PENCIL ROCKER SW 15FT (MISCELLANEOUS) ×2 IMPLANT
ELECT REM PT RETURN 15FT ADLT (MISCELLANEOUS) ×2 IMPLANT
EVACUATOR SILICONE 100CC (DRAIN) ×2 IMPLANT
FLOSEAL 10ML (HEMOSTASIS) ×2 IMPLANT
GLOVE BIO SURGEON STRL SZ 6.5 (GLOVE) ×2 IMPLANT
GLOVE BIOGEL M STRL SZ7.5 (GLOVE) ×4 IMPLANT
GOWN STRL REUS W/TWL LRG LVL3 (GOWN DISPOSABLE) ×6 IMPLANT
IRRIG SUCT STRYKERFLOW 2 WTIP (MISCELLANEOUS) ×2
IRRIGATION SUCT STRKRFLW 2 WTP (MISCELLANEOUS) ×1 IMPLANT
KIT BASIN OR (CUSTOM PROCEDURE TRAY) ×2 IMPLANT
NS IRRIG 1000ML POUR BTL (IV SOLUTION) IMPLANT
POSITIONER SURGICAL ARM (MISCELLANEOUS) ×4 IMPLANT
POUCH SPECIMEN RETRIEVAL 10MM (ENDOMECHANICALS) ×2 IMPLANT
SEAL CANN UNIV 5-8 DVNC XI (MISCELLANEOUS) ×4 IMPLANT
SEAL XI 5MM-8MM UNIVERSAL (MISCELLANEOUS) ×4
SOLUTION ELECTROLUBE (MISCELLANEOUS) ×2 IMPLANT
SURGIFLO W/THROMBIN 8M KIT (HEMOSTASIS) ×2 IMPLANT
SUT ETHILON 3 0 PS 1 (SUTURE) ×2 IMPLANT
SUT MNCRL AB 4-0 PS2 18 (SUTURE) ×4 IMPLANT
SUT PDS AB 0 CTX 36 PDP370T (SUTURE) IMPLANT
SUT V-LOC BARB 180 2/0GR6 GS22 (SUTURE) ×6
SUT VIC AB 0 CT1 27 (SUTURE) ×1
SUT VIC AB 0 CT1 27XBRD ANTBC (SUTURE) ×1 IMPLANT
SUT VICRYL 0 UR6 27IN ABS (SUTURE) ×2 IMPLANT
SUT VLOC BARB 180 ABS3/0GR12 (SUTURE) ×4
SUTURE V-LC BRB 180 2/0GR6GS22 (SUTURE) ×3 IMPLANT
SUTURE VLOC BRB 180 ABS3/0GR12 (SUTURE) ×2 IMPLANT
TOWEL OR 17X26 10 PK STRL BLUE (TOWEL DISPOSABLE) ×2 IMPLANT
TOWEL OR NON WOVEN STRL DISP B (DISPOSABLE) ×2 IMPLANT
TRAY FOLEY MTR SLVR 16FR STAT (SET/KITS/TRAYS/PACK) ×2 IMPLANT
TRAY LAPAROSCOPIC (CUSTOM PROCEDURE TRAY) ×2 IMPLANT
TROCAR UNIVERSAL OPT 12M 100M (ENDOMECHANICALS) IMPLANT
TROCAR XCEL 12X100 BLDLESS (ENDOMECHANICALS) ×2 IMPLANT
WATER STERILE IRR 1000ML POUR (IV SOLUTION) IMPLANT

## 2017-07-28 NOTE — Anesthesia Postprocedure Evaluation (Signed)
Anesthesia Post Note  Patient: Jerry Weiss  Procedure(s) Performed: XI ROBOTIC ASSISTED LAPAROSCOPIC PARTIAL NEPHRECTOMY (Right Abdomen)     Patient location during evaluation: PACU Anesthesia Type: General Level of consciousness: awake and alert Pain management: pain level controlled Vital Signs Assessment: post-procedure vital signs reviewed and stable Respiratory status: spontaneous breathing, nonlabored ventilation, respiratory function stable and patient connected to nasal cannula oxygen Cardiovascular status: blood pressure returned to baseline and stable Postop Assessment: no apparent nausea or vomiting Anesthetic complications: no    Last Vitals:  Vitals:   07/28/17 1700 07/28/17 1718  BP: (!) 142/72 140/74  Pulse: 65 74  Resp: 13 18  Temp: (!) 36.3 C 36.6 C  SpO2: 97% 98%    Last Pain:  Vitals:   07/28/17 1718  TempSrc: Oral  PainSc:                  Tarick Parenteau P Tvisha Schwoerer

## 2017-07-28 NOTE — OR Nursing (Addendum)
CLAMPED @1429  2ND CLAMP 1429 3RD [id number] 1ST CLAMP OFF 1445, 2ND CLAMP OFF @1445  3RD AND FINAL CLAMP OFF @1440 (UNCLAMPED FIRST)

## 2017-07-28 NOTE — Anesthesia Procedure Notes (Signed)
Procedure Name: Intubation Date/Time: 07/28/2017 11:32 AM Performed by: Glory Buff, CRNA Pre-anesthesia Checklist: Patient identified, Emergency Drugs available, Suction available and Patient being monitored Patient Re-evaluated:Patient Re-evaluated prior to induction Oxygen Delivery Method: Circle system utilized Preoxygenation: Pre-oxygenation with 100% oxygen Induction Type: IV induction Ventilation: Mask ventilation without difficulty Laryngoscope Size: Miller and 3 Grade View: Grade II Tube type: Oral Tube size: 7.5 mm Number of attempts: 1 Airway Equipment and Method: Stylet and Oral airway Placement Confirmation: ETT inserted through vocal cords under direct vision,  positive ETCO2 and breath sounds checked- equal and bilateral Secured at: 22 cm Tube secured with: Tape Dental Injury: Teeth and Oropharynx as per pre-operative assessment

## 2017-07-28 NOTE — Interval H&P Note (Signed)
History and Physical Interval Note:  07/28/2017 10:36 AM  Jerry Weiss  has presented today for surgery, with the diagnosis of RIGHT RENAL NEOPLASM  The various methods of treatment have been discussed with the patient and family. After consideration of risks, benefits and other options for treatment, the patient has consented to  Procedure(s): XI ROBOTIC ASSISTED LAPAROSCOPIC PARTIAL NEPHRECTOMY (Right) as a surgical intervention .  The patient's history has been reviewed, patient examined, no change in status, stable for surgery.  I have reviewed the patient's chart and labs.  Questions were answered to the patient's satisfaction.     Laasia Arcos,LES

## 2017-07-28 NOTE — Op Note (Signed)
Preoperative diagnosis: Right renal neoplasms, functional solitary right kidney  Postoperative diagnosis: Right renal neoplasms, functional solitary right kidney  Procedure:  1. Right robotic-assisted laparoscopic partial nephrectomy 2. Intraoperative renal ultrasonography  Surgeon: Pryor Curia. M.D.  Assistant(s): Debbrah Alar, PA-C  An assistant was required for this surgical procedure.  The duties of the assistant included but were not limited to suctioning, passing suture, camera manipulation, retraction. This procedure would not be able to be performed without an Environmental consultant.  Anesthesia: General  Complications: None  EBL: 75 mL  IVF:  3800 mL crystalloid, 500 mL colloid  Specimens: 1. Upper pole right renal neoplasms 2. Lower pole right renal neoplasm 3. Perinephric fat 4. Deep margin of upper pole right renal tumor 5. Deep margin of lower pole right renal tumor  Disposition of specimens: Pathology  Intraoperative findings:       1. Warm renal ischemia time: Warm ischemia time for the upper pole renal tumor resection was 9 minutes.  Warm ischemia time for the lower pole renal tumor resection was 7 minutes.  Renal ischemia occurred sequentially.  An attempt to perform segmental upper pole renal artery ischemia was not feasible.       2. Intraoperative renal ultrasound findings: Intraoperative ultrasonography was performed and revealed a solid 3.5 cm upper pole renal mass.  Margins were marked using ultrasonography.  In addition, a second 2 cm lower pole renal artery was also visualized on ultrasound and the margins were marked using ultrasonography.  Drains: 1. # 15 Blake perinephric drain  Indication:  Jerry Weiss is a 62 y.o. year old patient with a solitary functional right kidney.  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  During his evaluation, he was noted to have 3 separate right renal masses including a 3.6 cm upper pole  renal mass with a small subcentimeter adjacent tumor and a separate 2 cm lower pole right renal tumor.  After a thorough review of the management options for their renal mass, they elected to proceed with surgical treatment and the above procedure.  We have discussed the potential benefits and risks of the procedure, side effects of the proposed treatment, the likelihood of the patient achieving the goals of the procedure, and any potential problems that might occur during the procedure or recuperation. Informed consent has been obtained.   Description of procedure:  The patient was taken to the operating room and a general anesthetic was administered. The patient was given preoperative antibiotics, placed in the right modified flank position with care to pad all potential pressure points, and prepped and draped in the usual sterile fashion. Next a preoperative timeout was performed.  A site was selected on in the midline for placement of the assistant port. This was placed using a standard open Hassan technique which allowed entry into the peritoneal cavity under direct vision and without difficulty. A 12 mm port was placed and a pneumoperitoneum established. The camera was then used to inspect the abdomen and there was no evidence of any intra-abdominal injuries or other abnormalities. The remaining abdominal ports were then placed. 8 mm robotic ports were placed in the right upper quadrant, right lower quadrant, and far right lateral abdominal wall. An 8 mm port was placed for the camera site just right of the umbilicus. All ports were placed under direct vision without difficulty. The surgical cart was then docked.   Utilizing the cautery scissors, the white line of Toldt was incised allowing the colon  to be mobilized medially and the plane between the mesocolon and the anterior layer of Gerota's fascia to be developed and the kidney to be exposed.  The ureter and gonadal vein were identified inferiorly  and the ureter was lifted anteriorly off the psoas muscle.  Dissection proceeded superiorly along the gonadal vein until the renal vein was identified.  The renal hilum was then carefully isolated with a combination of blunt and sharp dissection allowing the renal arterial and venous structures to be separated and isolated in preparation for renal hilar vessel clamping.  He was noted to have a single renal artery that branched into an upper pole segmental artery and 2 smaller subsegmental arteries entering the upper pole.  There was a single renal vein.  Attention turned to the kidney and the perinephric fat surrounding the renal mass was removed and the kidney was mobilized sufficiently for exposure and resection of the renal masses.  Once the kidney was completely mobilized and the renal capsule exposed with the tumor sites identified, intraoperative ultrasonography was performed.  Intraoperative renal ultrasonography was utilized with the laparoscopic ultrasound probe to identify the renal tumor and identify the tumor margins.  Findings are as dictated above.  The margins were marked on the renal capsule using ultrasound.  Once the renal tumor were properly isolated, preparations were made for resection of the tumor.  Reconstructive sutures were placed into the abdomen for the renorrhaphy portions of the procedure.  I then clamped the upper pole renal artery branch along with the upper pole subsegmental renal branch and administered indocyanine green dye.  Unfortunately, approximately 40% of the renal mass was still perfused.  I therefore clamped the main renal artery and proceeded with resection.  The tumor was excised with cold scissor dissection with a small margin laterally.  As I identified the tumor I then performed enucleation with an attempt to spare as much functional nephron tissue is possible considering his solitary kidney.  I then ran a 3-0 V lock suture through the base of the renal defect  using Humalog clips to secure this on the capsular surface of the kidney.  I then unclamped the main renal artery for early unclamping.  Warm ischemia time was 9 minutes.  I then used a 2-0 V lock suture to close the renal capsule.  There was initial bleeding from the kidney after early unclamping but hemostasis appeared to be quite adequate following the capsular suture.  An additional 2-0 V lock suture was placed to further provide compression via the renal capsule and secured with Weck clips.  Notably, a deep margin was also taken during the resection for permanent analysis considering the tumor was nucleated.  I correlate  I then allowed the kidney to be perfused as I made preparations to resect the lower pole tumor.  The main renal artery was then clamped with bulldog clamps.  The tumor was then excised with cold scissor dissection along with an adequate visible gross margin of normal renal parenchyma. The tumor appeared to be excised without any gross violation of the tumor. The renal collecting system was not entered during removal of the tumor.  A running 3-0 V-lock suture was then brought through the capsule of the kidney and run along the base of the renal defect to provide hemostasis and close any entry into the renal collecting system if present. Weck clips were used to secure this suture outside the renal capsule at the proximal and distal ends.  Again, early unclamping was  performed.  Warm ischemia was 7 minutes.   A running 2-0 V lock suture was then used to close the capsule of the kidney using a sliding clip technique which resulted in excellent hemostasis.    FloSeal was then placed into both renal defects.  The renal tumor resection sites were examined. Hemostasis appeared adequate.   The kidney was placed back into its normal anatomic position and covered with perinephric fat as needed.  A # 70 Blake drain was then brought through the lateral lower port site and positioned in the  perinephric space.  It was secured to the skin with a nylon suture. The surgical cart was undocked.  The renal tumor specimen was removed intact within an endopouch retrieval bag via the upper midline port site. This incision site was closed at the fascial layer with 0-vicryl suture. All other laparoscopic/robotic ports were removed under direct vision and the pneumoperitoneum let down with inspection of the operative field performed and hemostasis again confirmed. All incision sites were then injected with local anesthetic and reapproximated at the skin level with 4-0 monocryl subcuticular closures.  Dermabond was applied to the skin.  The patient tolerated the procedure well and without complications.  The patient was able to be extubated and transferred to the recovery unit in satisfactory condition.  Pryor Curia MD

## 2017-07-28 NOTE — Transfer of Care (Signed)
Immediate Anesthesia Transfer of Care Note  Patient: Jerry Weiss  Procedure(s) Performed: XI ROBOTIC ASSISTED LAPAROSCOPIC PARTIAL NEPHRECTOMY (Right Abdomen)  Patient Location: PACU  Anesthesia Type:General  Level of Consciousness: awake, alert  and oriented  Airway & Oxygen Therapy: Patient Spontanous Breathing and Patient connected to face mask oxygen  Post-op Assessment: Report given to RN and Post -op Vital signs reviewed and stable  Post vital signs: Reviewed and stable  Last Vitals:  Vitals Value Taken Time  BP 146/77 07/28/2017  3:53 PM  Temp    Pulse 72 07/28/2017  3:56 PM  Resp 11 07/28/2017  3:56 PM  SpO2 100 % 07/28/2017  3:56 PM  Vitals shown include unvalidated device data.  Last Pain:  Vitals:   07/28/17 0901  TempSrc: Oral      Patients Stated Pain Goal: 3 (39/03/00 9233)  Complications: No apparent anesthesia complications

## 2017-07-28 NOTE — Discharge Instructions (Signed)

## 2017-07-28 NOTE — Anesthesia Preprocedure Evaluation (Addendum)
Anesthesia Evaluation  Patient identified by MRN, date of birth, ID band Patient awake    Reviewed: Allergy & Precautions, NPO status , Patient's Chart, lab work & pertinent test results, reviewed documented beta blocker date and time   Airway Mallampati: III  TM Distance: >3 FB Neck ROM: Full    Dental no notable dental hx.    Pulmonary sleep apnea and Continuous Positive Airway Pressure Ventilation ,    Pulmonary exam normal breath sounds clear to auscultation       Cardiovascular hypertension, Pt. on medications and Pt. 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  Pre-op eval per vascular surgery Donzetta Matters)   Neuro/Psych negative neurological ROS  negative psych ROS   GI/Hepatic Neg liver ROS, GERD  Medicated and Controlled,  Endo/Other  diabetes, Oral Hypoglycemic Agents  Renal/GU Renal disease     Musculoskeletal negative musculoskeletal ROS (+)   Abdominal (+) + obese,   Peds  Hematology  (+) anemia ,   Anesthesia Other Findings   Reproductive/Obstetrics                           Anesthesia Physical Anesthesia Plan  ASA: III  Anesthesia Plan: General   Post-op Pain Management:    Induction: Intravenous  PONV Risk Score and Plan: 2 and Ondansetron, Dexamethasone, Midazolam and Treatment may vary due to age or medical condition  Airway Management Planned: Oral ETT  Additional Equipment:   Intra-op Plan:   Post-operative Plan: Extubation in OR  Informed Consent: I have reviewed the patients History and Physical, chart, labs and discussed the procedure including the risks, benefits and alternatives for the proposed anesthesia with the patient or authorized representative who has indicated his/her  understanding and acceptance.   Dental advisory given  Plan Discussed with: CRNA  Anesthesia Plan Comments: (Potential arterial line discussed)       Anesthesia Quick Evaluation

## 2017-07-29 ENCOUNTER — Encounter (HOSPITAL_COMMUNITY): Payer: Self-pay | Admitting: Urology

## 2017-07-29 DIAGNOSIS — I7101 Dissection of thoracic aorta: Secondary | ICD-10-CM | POA: Diagnosis not present

## 2017-07-29 DIAGNOSIS — Z79899 Other long term (current) drug therapy: Secondary | ICD-10-CM | POA: Diagnosis not present

## 2017-07-29 DIAGNOSIS — N280 Ischemia and infarction of kidney: Secondary | ICD-10-CM | POA: Diagnosis not present

## 2017-07-29 DIAGNOSIS — Z7951 Long term (current) use of inhaled steroids: Secondary | ICD-10-CM | POA: Diagnosis not present

## 2017-07-29 DIAGNOSIS — N401 Enlarged prostate with lower urinary tract symptoms: Secondary | ICD-10-CM | POA: Diagnosis not present

## 2017-07-29 DIAGNOSIS — G4733 Obstructive sleep apnea (adult) (pediatric): Secondary | ICD-10-CM | POA: Diagnosis not present

## 2017-07-29 DIAGNOSIS — Z7982 Long term (current) use of aspirin: Secondary | ICD-10-CM | POA: Diagnosis not present

## 2017-07-29 DIAGNOSIS — K429 Umbilical hernia without obstruction or gangrene: Secondary | ICD-10-CM | POA: Diagnosis not present

## 2017-07-29 DIAGNOSIS — K439 Ventral hernia without obstruction or gangrene: Secondary | ICD-10-CM | POA: Diagnosis not present

## 2017-07-29 DIAGNOSIS — I129 Hypertensive chronic kidney disease with stage 1 through stage 4 chronic kidney disease, or unspecified chronic kidney disease: Secondary | ICD-10-CM | POA: Diagnosis not present

## 2017-07-29 DIAGNOSIS — K219 Gastro-esophageal reflux disease without esophagitis: Secondary | ICD-10-CM | POA: Diagnosis not present

## 2017-07-29 DIAGNOSIS — N183 Chronic kidney disease, stage 3 (moderate): Secondary | ICD-10-CM | POA: Diagnosis not present

## 2017-07-29 DIAGNOSIS — N179 Acute kidney failure, unspecified: Secondary | ICD-10-CM | POA: Diagnosis not present

## 2017-07-29 DIAGNOSIS — C641 Malignant neoplasm of right kidney, except renal pelvis: Secondary | ICD-10-CM | POA: Diagnosis not present

## 2017-07-29 DIAGNOSIS — E1122 Type 2 diabetes mellitus with diabetic chronic kidney disease: Secondary | ICD-10-CM | POA: Diagnosis not present

## 2017-07-29 DIAGNOSIS — Z7984 Long term (current) use of oral hypoglycemic drugs: Secondary | ICD-10-CM | POA: Diagnosis not present

## 2017-07-29 LAB — BASIC METABOLIC PANEL
ANION GAP: 10 (ref 5–15)
Anion gap: 9 (ref 5–15)
BUN: 29 mg/dL — AB (ref 6–20)
BUN: 29 mg/dL — AB (ref 6–20)
CALCIUM: 8.2 mg/dL — AB (ref 8.9–10.3)
CO2: 21 mmol/L — AB (ref 22–32)
CO2: 22 mmol/L (ref 22–32)
CREATININE: 2.11 mg/dL — AB (ref 0.61–1.24)
Calcium: 8.6 mg/dL — ABNORMAL LOW (ref 8.9–10.3)
Chloride: 104 mmol/L (ref 101–111)
Chloride: 107 mmol/L (ref 101–111)
Creatinine, Ser: 1.79 mg/dL — ABNORMAL HIGH (ref 0.61–1.24)
GFR calc Af Amer: 45 mL/min — ABNORMAL LOW (ref >60)
GFR calc Af Amer: 37 mL/min — ABNORMAL LOW (ref >60)
GFR calc non Af Amer: 39 mL/min — ABNORMAL LOW (ref >60)
GFR, EST NON AFRICAN AMERICAN: 32 mL/min — AB (ref >60)
GLUCOSE: 264 mg/dL — AB (ref 65–99)
GLUCOSE: 187 mg/dL — AB (ref 65–99)
POTASSIUM: 4.2 mmol/L (ref 3.5–5.1)
Potassium: 4 mmol/L (ref 3.5–5.1)
SODIUM: 138 mmol/L (ref 135–145)
Sodium: 135 mmol/L (ref 135–145)

## 2017-07-29 LAB — HEMOGLOBIN AND HEMATOCRIT, BLOOD
HCT: 32.8 % — ABNORMAL LOW (ref 39.0–52.0)
Hemoglobin: 10.6 g/dL — ABNORMAL LOW (ref 13.0–17.0)

## 2017-07-29 LAB — CREATININE, FLUID (PLEURAL, PERITONEAL, JP DRAINAGE): Creat, Fluid: 2 mg/dL

## 2017-07-29 LAB — GLUCOSE, CAPILLARY
GLUCOSE-CAPILLARY: 162 mg/dL — AB (ref 65–99)
GLUCOSE-CAPILLARY: 223 mg/dL — AB (ref 65–99)
GLUCOSE-CAPILLARY: 244 mg/dL — AB (ref 65–99)
GLUCOSE-CAPILLARY: 291 mg/dL — AB (ref 65–99)
Glucose-Capillary: 156 mg/dL — ABNORMAL HIGH (ref 65–99)
Glucose-Capillary: 172 mg/dL — ABNORMAL HIGH (ref 65–99)

## 2017-07-29 MED ORDER — BISACODYL 10 MG RE SUPP
10.0000 mg | Freq: Once | RECTAL | Status: DC
  Filled 2017-07-29: qty 1

## 2017-07-29 MED ORDER — HYDROCODONE-ACETAMINOPHEN 5-325 MG PO TABS
1.0000 | ORAL_TABLET | Freq: Four times a day (QID) | ORAL | Status: DC | PRN
Start: 2017-07-29 — End: 2017-07-31
  Administered 2017-07-29 – 2017-07-31 (×4): 2 via ORAL
  Filled 2017-07-29: qty 2
  Filled 2017-07-29: qty 1
  Filled 2017-07-29 (×2): qty 2
  Filled 2017-07-29: qty 1

## 2017-07-29 MED ORDER — SODIUM CHLORIDE 0.45 % IV SOLN
INTRAVENOUS | Status: DC
  Administered 2017-07-29: 09:00:00 via INTRAVENOUS

## 2017-07-29 MED ORDER — TRAMADOL HCL 50 MG PO TABS
50.0000 mg | ORAL_TABLET | Freq: Four times a day (QID) | ORAL | Status: DC | PRN
  Administered 2017-07-29: 50 mg via ORAL
  Filled 2017-07-29: qty 1

## 2017-07-29 MED ORDER — TRAMADOL HCL 50 MG PO TABS: 50.0000 mg | ORAL_TABLET | Freq: Four times a day (QID) | ORAL | 0 refills | Status: DC | PRN

## 2017-07-29 MED ORDER — HYDROCODONE-ACETAMINOPHEN 5-325 MG PO TABS: 1.0000 | ORAL_TABLET | Freq: Four times a day (QID) | ORAL | 0 refills | Status: DC | PRN

## 2017-07-29 NOTE — Progress Notes (Signed)
Patient ID: Jerry Weiss, male   DOB: 05-19-1955, 62 y.o.   MRN: [id number]  1 Day Post-Op Subjective: Pt doing well.  Pain controlled.  No nausea or vomiting.  Objective: Vital signs in last 24 hours: Temp:  [97.3 F (36.3 C)-98.3 F (36.8 C)] 97.9 F (36.6 C) (05/17 0431) Pulse Rate:  [59-80] 76 (05/17 0431) Resp:  [11-20] 16 (05/17 0431) BP: (131-153)/(72-80) 150/80 (05/17 0431) SpO2:  [94 %-100 %] 96 % (05/17 0431) Weight:  [88.5 kg (195 lb)] 88.5 kg (195 lb) (05/16 0913)  Intake/Output from previous day: 05/16 0701 - 05/17 0700 In: 8104.8 [P.O.:600; I.V.:6504.8; IV Piggyback:1000] Out: 6168 [Urine:2675; Drains:520; Blood:75] Intake/Output this shift: No intake/output data recorded.  Physical Exam:  General: Alert and oriented CV: RRR Lungs: Clear Abdomen: Soft, ND, positive BS Incisions: C/D/I Ext: NT, No erythema  Lab Results: Recent Labs    07/28/17 1618 07/29/17 0434  HGB 10.7* 10.6*  HCT 33.1* 32.8*   BMET Recent Labs    07/28/17 1618 07/29/17 0434  NA 140 135  K 4.3 4.2  CL 108 104  CO2 23 21*  GLUCOSE 159* 264*  BUN 26* 29*  CREATININE 1.43* 1.79*  CALCIUM 9.0 8.6*     Studies/Results: No results found.  Assessment/Plan: POD # 1 s/p right RAL partial nephrectomy with function solitary right kidney - Ambulate, IS - Advance diet - Decrease IVF - Path pending - Oral pain medication - Monitor renal function and drain output - D/C catheter   LOS: 0 days   Emile Kyllo,LES 07/29/2017, 7:46 AM

## 2017-07-29 NOTE — Progress Notes (Addendum)
Patient ID: Jerry Weiss, male   DOB: [birth year], 62 y.o.   MRN: [id number]  Pt doing well overall.  Pain not very well controlled with tramadol.  Ambulating.  Tolerating diet.  Cr 2.1 this afternoon  - Drain Cr sent - SL IV - Will keep on Ace Inhibitor for now as BP control is paramount for aortic dissection and s/p partial nephrectomy - Plan to d/c home tomorrow if Cr stabilizing (if needed can have him come to the office early next week to follow renal function closely)

## 2017-07-30 DIAGNOSIS — C641 Malignant neoplasm of right kidney, except renal pelvis: Secondary | ICD-10-CM | POA: Diagnosis not present

## 2017-07-30 DIAGNOSIS — Z79899 Other long term (current) drug therapy: Secondary | ICD-10-CM | POA: Diagnosis not present

## 2017-07-30 DIAGNOSIS — K429 Umbilical hernia without obstruction or gangrene: Secondary | ICD-10-CM | POA: Diagnosis not present

## 2017-07-30 DIAGNOSIS — E1122 Type 2 diabetes mellitus with diabetic chronic kidney disease: Secondary | ICD-10-CM | POA: Diagnosis not present

## 2017-07-30 DIAGNOSIS — G4733 Obstructive sleep apnea (adult) (pediatric): Secondary | ICD-10-CM | POA: Diagnosis not present

## 2017-07-30 DIAGNOSIS — N179 Acute kidney failure, unspecified: Secondary | ICD-10-CM | POA: Diagnosis not present

## 2017-07-30 DIAGNOSIS — K219 Gastro-esophageal reflux disease without esophagitis: Secondary | ICD-10-CM | POA: Diagnosis not present

## 2017-07-30 DIAGNOSIS — N183 Chronic kidney disease, stage 3 (moderate): Secondary | ICD-10-CM | POA: Diagnosis not present

## 2017-07-30 DIAGNOSIS — I7101 Dissection of thoracic aorta: Secondary | ICD-10-CM | POA: Diagnosis not present

## 2017-07-30 DIAGNOSIS — Z7982 Long term (current) use of aspirin: Secondary | ICD-10-CM | POA: Diagnosis not present

## 2017-07-30 DIAGNOSIS — K439 Ventral hernia without obstruction or gangrene: Secondary | ICD-10-CM | POA: Diagnosis not present

## 2017-07-30 DIAGNOSIS — N280 Ischemia and infarction of kidney: Secondary | ICD-10-CM | POA: Diagnosis not present

## 2017-07-30 DIAGNOSIS — I129 Hypertensive chronic kidney disease with stage 1 through stage 4 chronic kidney disease, or unspecified chronic kidney disease: Secondary | ICD-10-CM | POA: Diagnosis not present

## 2017-07-30 DIAGNOSIS — Z7984 Long term (current) use of oral hypoglycemic drugs: Secondary | ICD-10-CM | POA: Diagnosis not present

## 2017-07-30 DIAGNOSIS — N401 Enlarged prostate with lower urinary tract symptoms: Secondary | ICD-10-CM | POA: Diagnosis not present

## 2017-07-30 DIAGNOSIS — Z7951 Long term (current) use of inhaled steroids: Secondary | ICD-10-CM | POA: Diagnosis not present

## 2017-07-30 LAB — GLUCOSE, CAPILLARY
GLUCOSE-CAPILLARY: 123 mg/dL — AB (ref 65–99)
GLUCOSE-CAPILLARY: 163 mg/dL — AB (ref 65–99)
Glucose-Capillary: 112 mg/dL — ABNORMAL HIGH (ref 65–99)
Glucose-Capillary: 122 mg/dL — ABNORMAL HIGH (ref 65–99)
Glucose-Capillary: 152 mg/dL — ABNORMAL HIGH (ref 65–99)
Glucose-Capillary: 168 mg/dL — ABNORMAL HIGH (ref 65–99)

## 2017-07-30 LAB — BASIC METABOLIC PANEL
Anion gap: 12 (ref 5–15)
BUN: 34 mg/dL — ABNORMAL HIGH (ref 6–20)
CALCIUM: 8.5 mg/dL — AB (ref 8.9–10.3)
CO2: 20 mmol/L — AB (ref 22–32)
CREATININE: 2.67 mg/dL — AB (ref 0.61–1.24)
Chloride: 103 mmol/L (ref 101–111)
GFR calc non Af Amer: 24 mL/min — ABNORMAL LOW (ref >60)
GFR, EST AFRICAN AMERICAN: 28 mL/min — AB (ref >60)
Glucose, Bld: 122 mg/dL — ABNORMAL HIGH (ref 65–99)
Potassium: 4.2 mmol/L (ref 3.5–5.1)
Sodium: 135 mmol/L (ref 135–145)

## 2017-07-30 LAB — HEMOGLOBIN AND HEMATOCRIT, BLOOD
HCT: 32.1 % — ABNORMAL LOW (ref 39.0–52.0)
Hemoglobin: 10.2 g/dL — ABNORMAL LOW (ref 13.0–17.0)

## 2017-07-30 NOTE — Progress Notes (Signed)
Patient ID: Kelyn Koskela, male   DOB: 02/28/1956, 62 y.o.   MRN: [id number]  2 Days Post-Op Subjective: Pt doing well.  Pain controlled.  No nausea or vomiting. Cr this morning elevated from yesterday - 2.67 today 2.11 yesterday afternoon, 1.79 yesterday morning.  JP cr normal and drain removed   Objective: Vital signs in last 24 hours: Temp:  [97.6 F (36.4 C)-98.7 F (37.1 C)] 98.7 F (37.1 C) (05/18 0350) Pulse Rate:  [69-77] 76 (05/18 0350) Resp:  [17-20] 20 (05/18 0350) BP: (112-130)/(59-68) 130/68 (05/18 0350) SpO2:  [94 %-95 %] 94 % (05/18 0350)  Intake/Output from previous day: 05/17 0701 - 05/18 0700 In: 1426.3 [P.O.:720; I.V.:706.3] Out: 712 [Urine:650; Drains:62] Intake/Output this shift: No intake/output data recorded.  Physical Exam:  General: Alert and oriented CV: RRR Lungs: Clear Abdomen: Soft, ND, JP site dressed  Incisions: C/D/I Ext: NT, No erythema  Lab Results: Recent Labs    07/28/17 1618 07/29/17 0434 07/30/17 0353  HGB 10.7* 10.6* 10.2*  HCT 33.1* 32.8* 32.1*   BMET Recent Labs    07/29/17 1406 07/30/17 0353  NA 138 135  K 4.0 4.2  CL 107 103  CO2 22 20*  GLUCOSE 187* 122*  BUN 29* 34*  CREATININE 2.11* 2.67*  CALCIUM 8.2* 8.5*     Studies/Results: No results found.  Assessment/Plan: POD # 1 s/p right RAL partial nephrectomy with function solitary right kidney  - Ambulate, IS - Regular diet - Aggressive hydration  - Path pending - Oral pain medication - BMP tomorrow morning .  - If cr stabilizes, will discharge tomorrow   Dispo: Pending stabilized Cr  Alla Feeling, MD 07/30/2017, 7:44 AM

## 2017-07-31 DIAGNOSIS — K219 Gastro-esophageal reflux disease without esophagitis: Secondary | ICD-10-CM | POA: Diagnosis not present

## 2017-07-31 DIAGNOSIS — E1122 Type 2 diabetes mellitus with diabetic chronic kidney disease: Secondary | ICD-10-CM | POA: Diagnosis not present

## 2017-07-31 DIAGNOSIS — G4733 Obstructive sleep apnea (adult) (pediatric): Secondary | ICD-10-CM | POA: Diagnosis not present

## 2017-07-31 DIAGNOSIS — Z7982 Long term (current) use of aspirin: Secondary | ICD-10-CM | POA: Diagnosis not present

## 2017-07-31 DIAGNOSIS — I7101 Dissection of thoracic aorta: Secondary | ICD-10-CM | POA: Diagnosis not present

## 2017-07-31 DIAGNOSIS — C641 Malignant neoplasm of right kidney, except renal pelvis: Secondary | ICD-10-CM | POA: Diagnosis not present

## 2017-07-31 DIAGNOSIS — K439 Ventral hernia without obstruction or gangrene: Secondary | ICD-10-CM | POA: Diagnosis not present

## 2017-07-31 DIAGNOSIS — Z7951 Long term (current) use of inhaled steroids: Secondary | ICD-10-CM | POA: Diagnosis not present

## 2017-07-31 DIAGNOSIS — Z79899 Other long term (current) drug therapy: Secondary | ICD-10-CM | POA: Diagnosis not present

## 2017-07-31 DIAGNOSIS — N280 Ischemia and infarction of kidney: Secondary | ICD-10-CM | POA: Diagnosis not present

## 2017-07-31 DIAGNOSIS — Z7984 Long term (current) use of oral hypoglycemic drugs: Secondary | ICD-10-CM | POA: Diagnosis not present

## 2017-07-31 DIAGNOSIS — K429 Umbilical hernia without obstruction or gangrene: Secondary | ICD-10-CM | POA: Diagnosis not present

## 2017-07-31 DIAGNOSIS — N179 Acute kidney failure, unspecified: Secondary | ICD-10-CM | POA: Diagnosis not present

## 2017-07-31 DIAGNOSIS — I129 Hypertensive chronic kidney disease with stage 1 through stage 4 chronic kidney disease, or unspecified chronic kidney disease: Secondary | ICD-10-CM | POA: Diagnosis not present

## 2017-07-31 DIAGNOSIS — N183 Chronic kidney disease, stage 3 (moderate): Secondary | ICD-10-CM | POA: Diagnosis not present

## 2017-07-31 DIAGNOSIS — N401 Enlarged prostate with lower urinary tract symptoms: Secondary | ICD-10-CM | POA: Diagnosis not present

## 2017-07-31 LAB — GLUCOSE, CAPILLARY
GLUCOSE-CAPILLARY: 118 mg/dL — AB (ref 65–99)
Glucose-Capillary: 110 mg/dL — ABNORMAL HIGH (ref 65–99)
Glucose-Capillary: 117 mg/dL — ABNORMAL HIGH (ref 65–99)
Glucose-Capillary: 122 mg/dL — ABNORMAL HIGH (ref 65–99)

## 2017-07-31 LAB — BASIC METABOLIC PANEL
Anion gap: 11 (ref 5–15)
BUN: 40 mg/dL — AB (ref 6–20)
CALCIUM: 8.5 mg/dL — AB (ref 8.9–10.3)
CHLORIDE: 105 mmol/L (ref 101–111)
CO2: 18 mmol/L — ABNORMAL LOW (ref 22–32)
CREATININE: 2.89 mg/dL — AB (ref 0.61–1.24)
GFR calc Af Amer: 25 mL/min — ABNORMAL LOW (ref >60)
GFR, EST NON AFRICAN AMERICAN: 22 mL/min — AB (ref >60)
Glucose, Bld: 112 mg/dL — ABNORMAL HIGH (ref 65–99)
Potassium: 4.1 mmol/L (ref 3.5–5.1)
SODIUM: 134 mmol/L — AB (ref 135–145)

## 2017-07-31 NOTE — Progress Notes (Signed)
Patient refuses to ambulate on hall way at this time. Said he will ambulate  later.

## 2017-07-31 NOTE — Progress Notes (Signed)
Patient refuses to use urinals for measurement of urine. Said he does not  feel comfortable using it. Will count occurences.

## 2017-07-31 NOTE — Discharge Summary (Signed)
[facility] Discharge Summary  Admit date: 07/28/2017  Discharge date and time: 07/31/17   Discharge to: Home  Discharge Service: Urology  Discharge Attending Physician:  Louis Meckel  Discharge  Diagnoses: <principal problem not specified>  Secondary Diagnosis: Active Problems:   Renal neoplasm   OR Procedures: Procedure(s): XI ROBOTIC ASSISTED LAPAROSCOPIC PARTIAL NEPHRECTOMY 07/28/2017   Ancillary Procedures: None   Discharge Day Services: The patient was seen and examined by the Urology team both in the morning and immediately prior to discharge.  Vital signs and laboratory values were stable and within normal limits.  The physical exam was benign and unchanged and all surgical wounds were examined.  Discharge instructions were explained and all questions answered.  Subjective  No acute events overnight. Pain Controlled. No fever or chills.  Objective Patient Vitals for the past 8 hrs:  BP Temp Temp src Pulse Resp SpO2  07/31/17 1420 (!) 119/58 98.7 F (37.1 C) Oral 71 18 93 %   No intake/output data recorded.  General Appearance:        No acute distress Lungs:                       Normal work of breathing on room air Heart:                                Regular rate and rhythm Abdomen:                         Soft, non-tender, non-distended. JP site bandaged Extremities:                      Warm and well perfused   Hospital Course:  The patient underwent a RAL pNx for a solitary R kidney on 07/28/2017.  The patient tolerated the procedure well, was extubated in the OR, and afterwards was taken to the PACU for routine post-surgical care. When stable the patient was transferred to the floor.   The patient did well postoperatively.  The patient's diet was slowly advanced and at the time of discharge was tolerating a regular diet.  The patient was discharged home 3 Days Post-Op, at which point was tolerating a regular solid diet, was able to void spontaneously, have  adequate pain control with P.O. pain medication, and could ambulate without difficulty. The patient will follow up with [place] for post op check.   Cr 2.89 on POD 2, 2.67 on POD1. Believed to have leveled out Will follow up as outpatient next week for lab Seen by Nephrology - they did not recommend any change in his current BP regimen  Condition at Discharge: Improved  Discharge Medications:  Allergies as of 07/31/2017      Reactions   Codeine    Shellfish-derived Products       Medication List    STOP taking these medications   aspirin EC 81 MG tablet   multivitamin capsule     TAKE these medications   amLODipine 10 MG tablet Commonly known as:  NORVASC Take 10 mg by mouth daily.   cetirizine 10 MG tablet Commonly known as:  ZYRTEC Take 10 mg by mouth daily as needed.   fluticasone 50 MCG/ACT nasal spray Commonly known as:  FLONASE Place 2 sprays into the nose daily as needed.   gabapentin 300 MG capsule Commonly known as:  NEURONTIN Take 300  mg by mouth at bedtime.   hydrALAZINE 100 MG tablet Commonly known as:  APRESOLINE Take 100 mg by mouth 3 (three) times daily.   HYDROcodone-acetaminophen 5-325 MG tablet Commonly known as:  NORCO/VICODIN Take 1-2 tablets by mouth every 6 (six) hours as needed for moderate pain (pain).   labetalol 200 MG tablet Commonly known as:  NORMODYNE Take 400 mg by mouth 3 (three) times daily.   lisinopril 20 MG tablet Commonly known as:  PRINIVIL,ZESTRIL Take 40 mg by mouth daily.   neomycin-polymyxin-dexameth 0.1 % Oint Commonly known as:  MAXITROL Place 1 application into both eyes daily as needed (eye irritation).   pantoprazole 20 MG tablet Commonly known as:  PROTONIX Take 20 mg by mouth daily.   pioglitazone 15 MG tablet Commonly known as:  ACTOS Take 15 mg by mouth daily.   sitaGLIPtin 100 MG tablet Commonly known as:  JANUVIA Take 100 mg by mouth daily.   tamsulosin 0.4 MG Caps capsule Commonly known as:   FLOMAX Take 0.8 mg by mouth at bedtime.   traMADol 50 MG tablet Commonly known as:  ULTRAM Take 1-2 tablets (50-100 mg total) by mouth every 6 (six) hours as needed for moderate pain.

## 2017-07-31 NOTE — Consult Note (Signed)
Renal Service Consult Note [facility]  Jerry Weiss 07/31/2017 Sol Blazing Requesting Physician:  Dr Louis Meckel  Reason for Consult:  AKI after partial nephrectomy HPI: The patient is a 62 y.o. 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  Surgery was on 5/16.  Postop creat bumped up  From 1.4 preop to 2.7 yesterday and 2.9 this am. UOP has been good per patient but not all recorded.  Has postop pain but otherwise no c/o's.  Did not receive any nsaids or iv contrast here.  Jerry Weiss is on home acei (lisinopril 40 qd) which has not been held due to hx aortic dissection.    Per H&P the patient was hospitalized in [place] dec 2018 for acute back pain due to aortic dissection.  Reportedly the dissection extended from the thoracic area down to the infrarenal region, Jerry Weiss had a thrombosed left kidney.  Incidentally there was a mass on the right kidney.  Jerry Weiss is f/b VVS here in [place] for the dissection and taking 4 bp medications with reasonalbe bp control per th epatient.  The surgery for removal of renal masses encompassed about 1/4 of the R kidney per the patient.  Postop Jerry Weiss has had bruising to the RLQ and pain in the R flank otherwise no sob, no voiding issues and no chest pain.  Voiding "a lot".     Home meds:  -norvasc 10/ hydralazine 100 tid/ labetalol 400 tid/ lisinopril 40 qd  -actos 15 qd/ januvia 100 qd  -neurontin 300 hs/ ecasa/ flomax/ prns/ vitamins/ ppi  ROS  denies CP  no joint pain   no HA  no blurry vision  no rash  no diarrhea  no nausea/ vomiting   Past Medical History  Past Medical History:  Diagnosis Date  . Anemia    mild  . 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 Diabetes mellitus without complication ([place])   . GERD (gastroesophageal  reflux disease)   . Hypertension   . Inguinal hernia    Bilateral age 3  . Leg muscle spasm    both legs occ  . OSA (obstructive sleep apnea)    Wears CPAP at night  . Pneumonia    history of x3  . Renal cancer, right ([place])   . 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 Renal mass, right   . Status post dilation of esophageal narrowing   . Umbilical hernia    Small noted on CT 05/26/2017  . Ventral hernia    noted on CT 05/26/2017   Past Surgical History  Past Surgical History:  Procedure Laterality Date  . COLONOSCOPY    . HERNIA REPAIR Bilateral    AGE 54  . ROBOT ASSISTED LAPAROSCOPIC NEPHRECTOMY Right 07/28/2017   Procedure: XI ROBOTIC ASSISTED LAPAROSCOPIC PARTIAL NEPHRECTOMY;  Surgeon: Raynelle Bring, MD;  Location: [facility];  Service: Urology;  Laterality: Right;  . TONSILLECTOMY     age 67  . UPPER GASTROINTESTINAL ENDOSCOPY     Family History  Family History  Problem Relation Age of Onset  . Allergies Mother   . Diabetes Mother   . Stroke Mother   . Skin cancer Mother   . Allergies Sister   . Asthma Daughter  childhood  . Asthma Father        developed a lot of medical problems after a fall  . Colon cancer Neg Hx   . Stomach cancer Neg Hx   . Rectal cancer Neg Hx   . Liver cancer Neg Hx   . Esophageal cancer Neg Hx    Social History  reports that Jerry Weiss has never smoked. Jerry Weiss has never used smokeless tobacco. Jerry Weiss reports that Jerry Weiss does not drink alcohol or use drugs. Allergies  Allergies  Allergen Reactions  . Codeine   . Shellfish-Derived Products    Home medications Prior to Admission medications   Medication Sig Start Date End Date Taking? Authorizing Provider  amLODipine (NORVASC) 10 MG tablet Take 10 mg by mouth daily.   Yes [provider]  aspirin EC 81 MG tablet Take 81 mg by mouth daily.   Yes [provider]  fluticasone (FLONASE) 50 MCG/ACT nasal spray Place 2 sprays into the nose daily as needed.    Yes [provider]  gabapentin (NEURONTIN) 300 MG capsule Take 300 mg by mouth at bedtime.   Yes [provider]  hydrALAZINE (APRESOLINE) 100 MG tablet Take 100 mg by mouth 3 (three) times daily.   Yes [provider]  labetalol (NORMODYNE) 200 MG tablet Take 400 mg by mouth 3 (three) times daily.    Yes [provider]  lisinopril (PRINIVIL,ZESTRIL) 20 MG tablet Take 40 mg by mouth daily.    Yes [provider]  neomycin-polymyxin-dexameth (MAXITROL) 0.1 % OINT Place 1 application into both eyes daily as needed (eye irritation).   Yes [provider]  pantoprazole (PROTONIX) 20 MG tablet Take 20 mg by mouth daily.  07/11/16  Yes [provider]  pioglitazone (ACTOS) 15 MG tablet Take 15 mg by mouth daily.   Yes [provider]  sitaGLIPtin (JANUVIA) 100 MG tablet Take 100 mg by mouth daily.   Yes [provider]  tamsulosin (FLOMAX) 0.4 MG CAPS capsule Take 0.8 mg by mouth at bedtime.    Yes [provider]  cetirizine (ZYRTEC) 10 MG tablet Take 10 mg by mouth daily as needed.     [provider]  HYDROcodone-acetaminophen (NORCO/VICODIN) 5-325 MG tablet Take 1-2 tablets by mouth every 6 (six) hours as needed for moderate pain (pain). 07/29/17   Raynelle Bring, MD  Multiple Vitamin (MULTIVITAMIN) capsule Take 1 capsule by mouth daily.      [provider]  traMADol (ULTRAM) 50 MG tablet Take 1-2 tablets (50-100 mg total) by mouth every 6 (six) hours as needed for moderate pain. 07/29/17   Debbrah Alar, PA-C   Liver Function Tests No results for input(s): AST, ALT, ALKPHOS, BILITOT, PROT, ALBUMIN in the last 168 hours. No results for input(s): LIPASE, AMYLASE in the last 168 hours. CBC Recent Labs  Lab 07/25/17 1530 07/28/17 1618 07/29/17 0434 07/30/17 0353  WBC 9.4  --   --   --   HGB 10.8* 10.7* 10.6* 10.2*  HCT 34.0* 33.1* 32.8* 32.1*  MCV 91.4  --   --   --   PLT 234  --   --   --    Basic Metabolic  Panel Recent Labs  Lab 07/25/17 1530 07/28/17 1618 07/29/17 0434 07/29/17 1406 07/30/17 0353 07/31/17 0424  NA 139 140 135 138 135 134*  K 4.5 4.3 4.2 4.0 4.2 4.1  CL 108 108 104 107 103 105  CO2 23 23 21* 22 20* 18*  GLUCOSE 149* 159* 264* 187* 122* 112*  BUN 26* 26* 29* 29* 34* 40*  CREATININE 1.08 1.43* 1.79* 2.11* 2.67* 2.89*  CALCIUM 8.9 9.0 8.6* 8.2* 8.5* 8.5*   Iron/TIBC/Ferritin/ %Sat No results found for: IRON, TIBC, FERRITIN, IRONPCTSAT  Vitals:   07/30/17 1711 07/30/17 1958 07/31/17 0427 07/31/17 1420  BP: (!) 149/78 135/72 132/68 (!) 119/58  Pulse:  88 71 71  Resp:  18 19 18   Temp:  100.3 F (37.9 C) 98 F (36.7 C) 98.7 F (37.1 C)  TempSrc:  Oral Oral Oral  SpO2:  93% 94% 93%  Weight:      Height:       Exam Gen alert, no distress No rash, cyanosis or gangrene Sclera anicteric, throat clear  No jvd or bruits Chest clear bilat to bases no wheezing or rales RRR no MRG Abd soft ntnd no mass or ascites +bs several healing stab wounds anterior abd R lower abd bruising GU normal male MS no joint effusions or deformity Ext no LE or UE edema, no wounds or ulcers Neuro is alert, Ox 3 , nf   Impression: 1  AKI on CKD3 - in setting of 1/4 R nephrectomy of functional solitary R kidney (L kidney thrombosed from aortic dissection 12/18). Baseline creat 1.4, creat up to 2.7 and probably leveling off.  Good uop.  OK for dc from renal standpoint will have office call the patient to set up office f/u.  Patient taking acei and in the setting of AKI we usually would the ace inhibitor however I am not comfortable doing this with his history of dissection and need for strict BP control, so recommend Jerry Weiss continue his current bp regimen.   2  Hx aortic type B dissection - dec 2018 in [place] 3  HTN  4  DM2   Plan - as above, have d/w primary MD  Kelly Splinter MD [place] pager [phone]   07/31/2017, 5:03 PM

## 2017-08-01 LAB — GLUCOSE, CAPILLARY: Glucose-Capillary: 108 mg/dL — ABNORMAL HIGH (ref 65–99)

## 2017-08-03 DIAGNOSIS — C641 Malignant neoplasm of right kidney, except renal pelvis: Secondary | ICD-10-CM | POA: Diagnosis not present

## 2017-08-03 DIAGNOSIS — D649 Anemia, unspecified: Secondary | ICD-10-CM | POA: Diagnosis not present

## 2017-08-03 DIAGNOSIS — E1165 Type 2 diabetes mellitus with hyperglycemia: Secondary | ICD-10-CM | POA: Diagnosis not present

## 2017-08-11 DIAGNOSIS — E1129 Type 2 diabetes mellitus with other diabetic kidney complication: Secondary | ICD-10-CM | POA: Diagnosis not present

## 2017-08-11 DIAGNOSIS — N184 Chronic kidney disease, stage 4 (severe): Secondary | ICD-10-CM | POA: Diagnosis not present

## 2017-08-11 DIAGNOSIS — I129 Hypertensive chronic kidney disease with stage 1 through stage 4 chronic kidney disease, or unspecified chronic kidney disease: Secondary | ICD-10-CM | POA: Diagnosis not present

## 2017-08-11 DIAGNOSIS — E1165 Type 2 diabetes mellitus with hyperglycemia: Secondary | ICD-10-CM | POA: Diagnosis not present

## 2017-08-11 DIAGNOSIS — I10 Essential (primary) hypertension: Secondary | ICD-10-CM | POA: Diagnosis not present

## 2017-08-11 DIAGNOSIS — N280 Ischemia and infarction of kidney: Secondary | ICD-10-CM | POA: Diagnosis not present

## 2017-08-11 DIAGNOSIS — C641 Malignant neoplasm of right kidney, except renal pelvis: Secondary | ICD-10-CM | POA: Diagnosis not present

## 2017-08-11 DIAGNOSIS — Z8679 Personal history of other diseases of the circulatory system: Secondary | ICD-10-CM | POA: Diagnosis not present

## 2017-08-14 ENCOUNTER — Emergency Department (HOSPITAL_COMMUNITY)
Admission: EM | Admit: 2017-08-14 | Discharge: 2017-08-14 | Disposition: A | Discharge status: Home / Self Care | Payer: BLUE CROSS/BLUE SHIELD | Attending: Emergency Medicine | Admitting: Emergency Medicine

## 2017-08-14 DIAGNOSIS — Z7984 Long term (current) use of oral hypoglycemic drugs: Secondary | ICD-10-CM | POA: Insufficient documentation

## 2017-08-14 DIAGNOSIS — R1030 Lower abdominal pain, unspecified: Secondary | ICD-10-CM | POA: Diagnosis not present

## 2017-08-14 DIAGNOSIS — Z85528 Personal history of other malignant neoplasm of kidney: Secondary | ICD-10-CM | POA: Insufficient documentation

## 2017-08-14 DIAGNOSIS — I10 Essential (primary) hypertension: Secondary | ICD-10-CM | POA: Insufficient documentation

## 2017-08-14 DIAGNOSIS — Z79899 Other long term (current) drug therapy: Secondary | ICD-10-CM | POA: Diagnosis not present

## 2017-08-14 DIAGNOSIS — E119 Type 2 diabetes mellitus without complications: Secondary | ICD-10-CM | POA: Diagnosis not present

## 2017-08-14 DIAGNOSIS — R339 Retention of urine, unspecified: Secondary | ICD-10-CM | POA: Insufficient documentation

## 2017-08-14 LAB — BASIC METABOLIC PANEL
Anion gap: 12 (ref 5–15)
BUN: 25 mg/dL — AB (ref 6–20)
CALCIUM: 9.5 mg/dL (ref 8.9–10.3)
CO2: 17 mmol/L — ABNORMAL LOW (ref 22–32)
CREATININE: 1.24 mg/dL (ref 0.61–1.24)
Chloride: 106 mmol/L (ref 101–111)
GFR calc Af Amer: >60 mL/min (ref >60)
Glucose, Bld: 131 mg/dL — ABNORMAL HIGH (ref 65–99)
Potassium: 3.7 mmol/L (ref 3.5–5.1)
Sodium: 135 mmol/L (ref 135–145)

## 2017-08-14 LAB — URINALYSIS, ROUTINE W REFLEX MICROSCOPIC
Bilirubin Urine: NEGATIVE
Glucose, UA: NEGATIVE mg/dL
Ketones, ur: NEGATIVE mg/dL
Leukocytes, UA: NEGATIVE
Nitrite: NEGATIVE
PROTEIN: NEGATIVE mg/dL
SPECIFIC GRAVITY, URINE: 1.002 — AB (ref 1.005–1.030)
pH: 5 (ref 5.0–8.0)

## 2017-08-14 LAB — CBC
HCT: 32.2 % — ABNORMAL LOW (ref 39.0–52.0)
Hemoglobin: 10.6 g/dL — ABNORMAL LOW (ref 13.0–17.0)
MCH: 29.3 pg (ref 26.0–34.0)
MCHC: 32.9 g/dL (ref 30.0–36.0)
MCV: 89 fL (ref 78.0–100.0)
Platelets: 352 10*3/uL (ref 150–400)
RBC: 3.62 MIL/uL — ABNORMAL LOW (ref 4.22–5.81)
RDW: 14.3 % (ref 11.5–15.5)
WBC: 13.9 10*3/uL — ABNORMAL HIGH (ref 4.0–10.5)

## 2017-08-14 MED ORDER — SODIUM CHLORIDE 0.9 % IV SOLN
INTRAVENOUS | Status: DC
  Administered 2017-08-14: 20 mL/h via INTRAVENOUS

## 2017-08-14 NOTE — Discharge Instructions (Addendum)
It was our pleasure to provide your ER care today - we hope that you feel better.  Empty foley bag as need.   Follow up with your urologist this Tuesday as arranged.  Return to ER if worse, new symptoms, fevers, vomiting, other concern.

## 2017-08-14 NOTE — ED Provider Notes (Signed)
[facility] DEPT Provider Note   CSN: [id number] Arrival date & time: 08/14/17  1643     History   Chief Complaint Chief Complaint  Patient presents with  . Urinary Retention    HPI Jerry Weiss is a 62 y.o. male.  Patient with hx single functioning kidney (due to dissection w loss of left kidney fxn 2018) s/p recent partial right nephrectomy for kidney mass/ca, presents with urinary retention since early this AM. Symptoms persistent, constant, mod/severe. Suprapubic pain, dull, constant. No back/flank pain. No preceding dysuria or hematuria. No fever or chills. No scrotal or testicular pain. Has been eating and drinking normally and making normal amt urine up until this AM.   The history is provided by the patient.    Past Medical History:  Diagnosis Date  . Anemia    mild  . 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 Diabetes mellitus without complication ([place])   . GERD (gastroesophageal reflux disease)   . Hypertension   . Inguinal hernia    Bilateral age 6  . Leg muscle spasm    both legs occ  . OSA (obstructive sleep apnea)    Wears CPAP at night  . Pneumonia    history of x3  . Renal cancer, right ([place])   . 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 Renal mass, right   . Status post dilation of esophageal narrowing   . Umbilical hernia    Small noted on CT 05/26/2017  . Ventral hernia    noted on CT 05/26/2017    Patient Active Problem List   Diagnosis Date Noted  . Renal neoplasm 07/28/2017  . OSA (obstructive sleep apnea) 07/15/2010    Past Surgical History:  Procedure Laterality Date  . COLONOSCOPY    . HERNIA REPAIR Bilateral    AGE 29  . ROBOT ASSISTED LAPAROSCOPIC NEPHRECTOMY Right 07/28/2017   Procedure: XI ROBOTIC ASSISTED LAPAROSCOPIC PARTIAL NEPHRECTOMY;  Surgeon: Raynelle Bring, MD;  Location: [facility];   Service: Urology;  Laterality: Right;  . TONSILLECTOMY     age 63  . UPPER GASTROINTESTINAL ENDOSCOPY          Home Medications    Prior to Admission medications   Medication Sig Start Date End Date Taking? Authorizing Provider  amLODipine (NORVASC) 10 MG tablet Take 10 mg by mouth daily.    [provider]  cetirizine (ZYRTEC) 10 MG tablet Take 10 mg by mouth daily as needed.     [provider]  fluticasone (FLONASE) 50 MCG/ACT nasal spray Place 2 sprays into the nose daily as needed.     [provider]  gabapentin (NEURONTIN) 300 MG capsule Take 300 mg by mouth at bedtime.    [provider]  hydrALAZINE (APRESOLINE) 100 MG tablet Take 100 mg by mouth 3 (three) times daily.    [provider]  HYDROcodone-acetaminophen (NORCO/VICODIN) 5-325 MG tablet Take 1-2 tablets by mouth every 6 (six) hours as needed for moderate pain (pain). 07/29/17   Raynelle Bring, MD  labetalol (NORMODYNE) 200 MG tablet Take 400 mg by mouth 3 (three) times daily.     [provider]  lisinopril (PRINIVIL,ZESTRIL) 20 MG tablet Take 40 mg by mouth daily.     [provider]  neomycin-polymyxin-dexameth (MAXITROL) 0.1 % OINT Place 1 application into both eyes daily as needed (eye irritation).  [provider]  pantoprazole (PROTONIX) 20 MG tablet Take 20 mg by mouth daily.  07/11/16   [provider]  pioglitazone (ACTOS) 15 MG tablet Take 15 mg by mouth daily.    [provider]  sitaGLIPtin (JANUVIA) 100 MG tablet Take 100 mg by mouth daily.    [provider]  tamsulosin (FLOMAX) 0.4 MG CAPS capsule Take 0.8 mg by mouth at bedtime.     [provider]  traMADol (ULTRAM) 50 MG tablet Take 1-2 tablets (50-100 mg total) by mouth every 6 (six) hours as needed for moderate pain. 07/29/17   Debbrah Alar, PA-C    Family History Family History  Problem Relation Age of Onset  . Allergies Mother   .  Diabetes Mother   . Stroke Mother   . Skin cancer Mother   . Allergies Sister   . Asthma Daughter        childhood  . Asthma Father        developed a lot of medical problems after a fall  . Colon cancer Neg Hx   . Stomach cancer Neg Hx   . Rectal cancer Neg Hx   . Liver cancer Neg Hx   . Esophageal cancer Neg Hx     Social History Social History   Tobacco Use  . Smoking status: Never Smoker  . Smokeless tobacco: Never Used  Substance Use Topics  . Alcohol use: No  . Drug use: No     Allergies   Codeine and Shellfish-derived products   Review of Systems Review of Systems  Constitutional: Negative for fever.  HENT: Negative for sore throat.   Eyes: Negative for redness.  Respiratory: Negative for shortness of breath.   Cardiovascular: Negative for chest pain.  Gastrointestinal: Negative for diarrhea and vomiting.  Genitourinary: Negative for dysuria, flank pain and hematuria.  Musculoskeletal: Negative for back pain.  Skin: Negative for rash.  Neurological: Negative for headaches.  Hematological: Does not bruise/bleed easily.  Psychiatric/Behavioral: Negative for confusion.     Physical Exam Updated Vital Signs BP (!) 180/95 (BP Location: Right Arm)   Pulse 79   Temp (!) 97.5 F (36.4 C) (Oral)   Resp 19   SpO2 98%   Physical Exam  Constitutional: He appears well-developed and well-nourished.  HENT:  Head: Atraumatic.  Eyes: Conjunctivae are normal.  Neck: Neck supple. No tracheal deviation present.  Cardiovascular: Normal rate, regular rhythm, normal heart sounds and intact distal pulses.  Pulmonary/Chest: Effort normal and breath sounds normal. No accessory muscle usage. No respiratory distress.  Abdominal: Soft. He exhibits no mass. There is no rebound and no guarding.  Suprapubic fullness/mild tenderness.   Genitourinary:  Genitourinary Comments: Normal external gu exam.   Musculoskeletal: He exhibits no edema.  Neurological: He is alert.    Skin: Skin is warm and dry.  Psychiatric: He has a normal mood and affect.  Nursing note and vitals reviewed.    ED Treatments / Results  Labs (all labs ordered are listed, but only abnormal results are displayed) Results for orders placed or performed during the hospital encounter of 47/65/46  Basic metabolic panel  Result Value Ref Range   Sodium 135 135 - 145 mmol/L   Potassium 3.7 3.5 - 5.1 mmol/L   Chloride 106 101 - 111 mmol/L   CO2 17 (L) 22 - 32 mmol/L   Glucose, Bld 131 (H) 65 - 99 mg/dL   BUN 25 (H) 6 - 20 mg/dL   Creatinine, Ser  1.24 0.61 - 1.24 mg/dL   Calcium 9.5 8.9 - 10.3 mg/dL   GFR calc non Af Amer >60 >60 mL/min   GFR calc Af Amer >60 >60 mL/min   Anion gap 12 5 - 15  CBC  Result Value Ref Range   WBC 13.9 (H) 4.0 - 10.5 K/uL   RBC 3.62 (L) 4.22 - 5.81 MIL/uL   Hemoglobin 10.6 (L) 13.0 - 17.0 g/dL   HCT 32.2 (L) 39.0 - 52.0 %   MCV 89.0 78.0 - 100.0 fL   MCH 29.3 26.0 - 34.0 pg   MCHC 32.9 30.0 - 36.0 g/dL   RDW 14.3 11.5 - 15.5 %   Platelets 352 150 - 400 K/uL  Urinalysis, Routine w reflex microscopic  Result Value Ref Range   Color, Urine STRAW (A) YELLOW   APPearance CLEAR CLEAR   Specific Gravity, Urine 1.002 (L) 1.005 - 1.030   pH 5.0 5.0 - 8.0   Glucose, UA NEGATIVE NEGATIVE mg/dL   Hgb urine dipstick MODERATE (A) NEGATIVE   Bilirubin Urine NEGATIVE NEGATIVE   Ketones, ur NEGATIVE NEGATIVE mg/dL   Protein, ur NEGATIVE NEGATIVE mg/dL   Nitrite NEGATIVE NEGATIVE   Leukocytes, UA NEGATIVE NEGATIVE   RBC / HPF 0-5 0 - 5 RBC/hpf   WBC, UA 0-5 0 - 5 WBC/hpf   Bacteria, UA RARE (A) NONE SEEN   EKG None  Radiology No results found.  Procedures Procedures (including critical care time)  Medications Ordered in ED Medications  0.9 %  sodium chloride infusion (has no administration in time range)     Initial Impression / Assessment and Plan / ED Course  I have reviewed the triage vital signs and the nursing notes.  Pertinent labs &  imaging results that were available during my care of the patient were reviewed by me and considered in my medical decision making (see chart for details).  Bladder scan - 800+ cc urine in bladder. Foley catheter placed.  Labs sent.   Reviewed nursing notes and prior charts for additional history.   Foley 1 liter clear sl yellow urine.  Leg bag.   Pt states has appt w his urologist in 2 days.   abd soft nt. Afebrile.   Labs reviewed - renal fxn improved from prior.   Pt currently appears stable for d/c.     Final Clinical Impressions(s) / ED Diagnoses   Final diagnoses:  None    ED Discharge Orders    None       Lajean Saver, MD 08/14/17 Joen Laura

## 2017-08-14 NOTE — ED Triage Notes (Signed)
Pt has not been able to urinate since 4AM today. Pt states he only had a small trickle since then. Pt only has his right kidney.

## 2017-08-14 NOTE — ED Notes (Signed)
Bed: WA17 Expected date:  Expected time:  Means of arrival:  Comments: Triage 1  

## 2017-08-17 DIAGNOSIS — N390 Urinary tract infection, site not specified: Secondary | ICD-10-CM | POA: Diagnosis not present

## 2017-08-17 DIAGNOSIS — B961 Klebsiella pneumoniae [K. pneumoniae] as the cause of diseases classified elsewhere: Secondary | ICD-10-CM | POA: Diagnosis not present

## 2017-08-17 DIAGNOSIS — R338 Other retention of urine: Secondary | ICD-10-CM | POA: Diagnosis not present

## 2017-08-27 ENCOUNTER — Encounter (HOSPITAL_COMMUNITY): Payer: Self-pay | Admitting: *Deleted

## 2017-08-27 ENCOUNTER — Emergency Department (HOSPITAL_COMMUNITY)
Admission: EM | Admit: 2017-08-27 | Discharge: 2017-08-27 | Disposition: A | Discharge status: Home / Self Care | Payer: BLUE CROSS/BLUE SHIELD | Attending: Emergency Medicine | Admitting: Emergency Medicine

## 2017-08-27 DIAGNOSIS — R339 Retention of urine, unspecified: Secondary | ICD-10-CM | POA: Diagnosis not present

## 2017-08-27 DIAGNOSIS — I10 Essential (primary) hypertension: Secondary | ICD-10-CM | POA: Diagnosis not present

## 2017-08-27 DIAGNOSIS — E119 Type 2 diabetes mellitus without complications: Secondary | ICD-10-CM | POA: Diagnosis not present

## 2017-08-27 LAB — URINALYSIS, ROUTINE W REFLEX MICROSCOPIC
BILIRUBIN URINE: NEGATIVE
GLUCOSE, UA: NEGATIVE mg/dL
HGB URINE DIPSTICK: NEGATIVE
Ketones, ur: NEGATIVE mg/dL
Leukocytes, UA: NEGATIVE
Nitrite: NEGATIVE
PROTEIN: NEGATIVE mg/dL
SPECIFIC GRAVITY, URINE: 1.011 (ref 1.005–1.030)
pH: 5 (ref 5.0–8.0)

## 2017-08-27 NOTE — ED Triage Notes (Signed)
Pt complains of urinary retention since 5AM this morning. Pt has hx of same, had catheter removed 2 days ago. Pt has not noticed hematuria.

## 2017-08-27 NOTE — ED Provider Notes (Signed)
Emergency Department Provider Note   I have reviewed the triage vital signs and the nursing notes.   HISTORY  Chief Complaint Urinary Retention   HPI Jerry Weiss is a 62 y.o. male with PMH of DM, GERD, Aortic dissection, HTN, and solitary kidney on the right s/p partial nephrectomy presents to the ED for evaluation of urinary retention. He was seen in the emergency department on 6/2 with urinary retention.  A Foley catheter was placed and patient followed up with urology.  2 days ago the catheter was removed in office during void trial.  Patient has been doing well until this morning when again having worsening with suprapubic pain.  His last urine output was 5 AM today.  He is describing severe lower abdominal pain fullness.  He has not seen any blood in the urine.  No fevers or chills.  No radiation to the flanks.  No chest pain or dyspnea.  Past Medical History:  Diagnosis Date  . Anemia    mild  . 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 Diabetes mellitus without complication ([place])   . GERD (gastroesophageal reflux disease)   . Hypertension   . Inguinal hernia    Bilateral age 89  . Leg muscle spasm    both legs occ  . OSA (obstructive sleep apnea)    Wears CPAP at night  . Pneumonia    history of x3  . Renal cancer, right ([place])   . 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 Renal mass, right   . Status post dilation of esophageal narrowing   . Umbilical hernia    Small noted on CT 05/26/2017  . Ventral hernia    noted on CT 05/26/2017    Patient Active Problem List   Diagnosis Date Noted  . Renal neoplasm 07/28/2017  . OSA (obstructive sleep apnea) 07/15/2010    Past Surgical History:  Procedure Laterality Date  . COLONOSCOPY    . HERNIA REPAIR Bilateral    AGE 48  . ROBOT ASSISTED LAPAROSCOPIC NEPHRECTOMY Right 07/28/2017   Procedure: XI ROBOTIC  ASSISTED LAPAROSCOPIC PARTIAL NEPHRECTOMY;  Surgeon: Raynelle Bring, MD;  Location: [facility];  Service: Urology;  Laterality: Right;  . TONSILLECTOMY     age 15  . UPPER GASTROINTESTINAL ENDOSCOPY      Current Outpatient Rx  . Order #: [id number] Class: Historical Med  . Order #: [id number] Class: Historical Med  . Order #: [id number] Class: Historical Med  . Order #: [id number] Class: Historical Med  . Order #: [id number] Class: Historical Med  . Order #: [id number] Class: Historical Med  . Order #: [id number] Class: Historical Med  . Order #: [id number] Class: Historical Med  . Order #: [id number] Class: Historical Med  . Order #: [id number] Class: Historical Med  . Order #: [id number] Class: Historical Med  . Order #: [id number] Class: Print  . Order #: [id number] Class: Print    Allergies Codeine and Shellfish-derived products  Family History  Problem Relation Age of Onset  . Allergies Mother   . Diabetes Mother   . Stroke Mother   . Skin cancer Mother   . Allergies Sister   . Asthma Daughter        childhood  . Asthma Father        developed a lot of medical problems after a fall  . Colon cancer Neg Hx   . Stomach cancer Neg Hx   .  Rectal cancer Neg Hx   . Liver cancer Neg Hx   . Esophageal cancer Neg Hx     Social History Social History   Tobacco Use  . Smoking status: Never Smoker  . Smokeless tobacco: Never Used  Substance Use Topics  . Alcohol use: No  . Drug use: No    Review of Systems  Constitutional: No fever/chills Eyes: No visual changes. ENT: No sore throat. Cardiovascular: Denies chest pain. Respiratory: Denies shortness of breath. Gastrointestinal: No abdominal pain.  No nausea, no vomiting.  No diarrhea.  No constipation. Genitourinary: Negative for dysuria. Positive urinary retention.  Musculoskeletal: Negative for back pain. Skin: Negative for rash. Neurological: Negative for headaches, focal weakness or numbness.  10-point ROS otherwise  negative.  ____________________________________________   PHYSICAL EXAM:  VITAL SIGNS: ED Triage Vitals  Enc Vitals Group     BP 08/27/17 1223 127/78     Pulse Rate 08/27/17 1223 82     Resp 08/27/17 1223 20     Temp 08/27/17 1223 (!) 97.5 F (36.4 C)     Temp Source 08/27/17 1223 Oral     SpO2 08/27/17 1223 98 %     Pain Score 08/27/17 1240 0   Constitutional: Alert and oriented. Patient appears uncomfortable.  Eyes: Conjunctivae are normal.  Head: Atraumatic. Nose: No congestion/rhinnorhea. Mouth/Throat: Mucous membranes are moist.  Neck: No stridor.  Cardiovascular: Normal rate, regular rhythm. Good peripheral circulation. Grossly normal heart sounds.   Respiratory: Normal respiratory effort.  No retractions. Lungs CTAB. Gastrointestinal: Soft with moderate suprapubic pain. No distention.  Genitourinary: Normal external genitalia.  Musculoskeletal: No lower extremity tenderness nor edema. No gross deformities of extremities. Neurologic:  Normal speech and language. No gross focal neurologic deficits are appreciated.  Skin:  Skin is warm, dry and intact. No rash noted.  ____________________________________________   LABS (all labs ordered are listed, but only abnormal results are displayed)  Labs Reviewed  URINALYSIS, ROUTINE W REFLEX MICROSCOPIC   ____________________________________________   PROCEDURES  Procedure(s) performed:   Procedures  None ____________________________________________   INITIAL IMPRESSION / ASSESSMENT AND PLAN / ED COURSE  Pertinent labs & imaging results that were available during my care of the patient were reviewed by me and considered in my medical decision making (see chart for details).  Patient presents to the emergency department for evaluation of suprapubic abdominal pain with clinical findings to suggest acute urinary retention.  He had his urinary catheter removed 2 days ago. Foley was replaced in the emergency  department without difficulty.  UA pending.  Feeling much better after cath. Normal UA. Continue outpatient Doxy.   At this time, I do not feel there is any life-threatening condition present. I have reviewed and discussed all results (EKG, imaging, lab, urine as appropriate), exam findings with patient. I have reviewed nursing notes and appropriate previous records.  I feel the patient is safe to be discharged home without further emergent workup. Discussed usual and customary return precautions. Patient and family (if present) verbalize understanding and are comfortable with this plan.  Patient will follow-up with their primary care provider. If they do not have a primary care provider, information for follow-up has been provided to them. All questions have been answered.  ____________________________________________  FINAL CLINICAL IMPRESSION(S) / ED DIAGNOSES  Final diagnoses:  Urinary retention     Note:  This document was prepared using Dragon voice recognition software and may include unintentional dictation errors.  Nanda Quinton, MD Emergency Medicine  Margette Fast, MD 08/27/17 [phone]

## 2017-08-27 NOTE — ED Notes (Signed)
Pt changed to a leg bag and verbalized understanding about foley care. He reports that he has had one before and knows how to care for it.

## 2017-08-27 NOTE — Discharge Instructions (Signed)
You were seen in the Emergency Department (ED) for urinary retention.  We placed a Foley catheter to help your bladder drain.  Please read through the included information and follow up with your doctor as recommended in these papers; your doctor will see you in clinic and help you determine when it is time to have the catheter removed.  If you stop producing urine in the bag or if you develop other symptoms that concern you, such as fever, chills, persistent vomiting, or severe abdominal pain, please return immediately to the Emergency Department.   Foley Catheter Care, Adult  A Foley catheter is a soft, flexible tube that is placed into the bladder to drain urine. A Foley catheter may be inserted if:  You leak urine or are not able to control when you urinate (urinary incontinence).  You are not able to urinate when you need to (urinary retention).  You had prostate surgery or surgery on the genitals.  You have certain medical conditions, such as multiple sclerosis, dementia, or a spinal cord injury. If you are going home with a Foley catheter in place, follow the instructions below.  TAKING CARE OF THE CATHETER  Wash your hands with soap and water. Using mild soap and warm water on a clean washcloth: Clean the area on your body closest to the catheter insertion site using a circular motion, moving away from the catheter. Never wipe toward the catheter because this could sweep bacteria up into the urethra and cause infection.  Remove all traces of soap. Pat the area dry with a clean towel. For males, reposition the foreskin. Attach the catheter to your leg so there is no tension on the catheter. Use adhesive tape or a leg strap. If you are using adhesive tape, remove any sticky residue left behind by the previous tape you used. Keep the drainage bag below the level of the bladder, but keep it off the floor. Check throughout the day to be sure the catheter is working and urine is draining freely.  Make sure the tubing does not become kinked. Do not pull on the catheter or try to remove it. Pulling could damage internal tissues. TAKING CARE OF THE DRAINAGE BAGS  You will be given two drainage bags to take home. One is a large overnight drainage bag, and the other is a smaller leg bag that fits underneath clothing. You may wear the overnight bag at any time, but you should never wear the smaller leg bag at night. Follow the instructions below for how to empty, change, and clean your drainage bags.  Emptying the Drainage Bag  You must empty your drainage bag when it is - full or at least 2-3 times a day.  Wash your hands with soap and water. Keep the drainage bag below your hips, below the level of your bladder. This stops urine from going back into the tubing and into your bladder. Hold the dirty bag over the toilet or a clean container. Open the pour spout at the bottom of the bag and empty the urine into the toilet or container. Do not let the pour spout touch the toilet, container, or any other surface. Doing so can place bacteria on the bag, which can cause an infection. Clean the pour spout with a gauze pad or cotton ball that has rubbing alcohol on it. Close the pour spout. Attach the bag to your leg with adhesive tape or a leg strap. Wash your hands well. Changing the Drainage Bag  Change your drainage bag once a month or sooner if it starts to smell bad or look dirty. Below are steps to follow when changing the drainage bag.  Wash your hands with soap and water. Pinch off the rubber catheter so that urine does not spill out. Disconnect the catheter tube from the drainage tube at the connection valve. Do not let the tubes touch any surface. Clean the end of the catheter tube with an alcohol wipe. Use a different alcohol wipe to clean the end of the drainage tube. Connect the catheter tube to the drainage tube of the clean drainage bag. Attach the new bag to the leg with adhesive  tape or a leg strap. Avoid attaching the new bag too tightly. Wash your hands well. Cleaning the Drainage Bag  Wash your hands with soap and water. Wash the bag in warm, soapy water. Rinse the bag thoroughly with warm water. Fill the bag with a solution of white vinegar and water (1 cup vinegar to 1 qt warm water [.2 L vinegar to 1 L warm water]). Close the bag and soak it for 30 minutes in the solution. Rinse the bag with warm water. Hang the bag to dry with the pour spout open and hanging downward. Store the clean bag (once it is dry) in a clean plastic bag. Wash your hands well. PREVENTING INFECTION  Wash your hands before and after handling your catheter.  Take showers daily and wash the area where the catheter enters your body. Do not take baths. Replace wet leg straps with dry ones, if this applies.  Do not use powders, sprays, or lotions on the genital area. Only use creams, lotions, or ointments as directed by your caregiver.  For females, wipe from front to back after each bowel movement.  Drink enough fluids to keep your urine clear or pale yellow unless you have a fluid restriction.  Do not let the drainage bag or tubing touch or lie on the floor.  Wear cotton underwear to absorb moisture and to keep your skin drier. SEEK MEDICAL CARE IF:  Your urine is cloudy or smells unusually bad.  Your catheter becomes clogged.  You are not draining urine into the bag or your bladder feels full.  Your catheter starts to leak. SEEK IMMEDIATE MEDICAL CARE IF:  You have pain, swelling, redness, or pus where the catheter enters the body.  You have pain in the abdomen, legs, lower back, or bladder.  You have a fever.  You see blood fill the catheter, or your urine is pink or red.  You have nausea, vomiting, or chills.  Your catheter gets pulled out. MAKE SURE YOU:  Understand these instructions.  Will watch your condition.  Will get help right away if you are not doing well or get  worse. This information is not intended to replace advice given to you by your health care provider. Make sure you discuss any questions you have with your health care provider.  Document Released: 03/01/2005 Document Revised: 07/16/2013 Document Reviewed: 02/21/2012  Elsevier Interactive Patient Education [employer].

## 2017-08-27 NOTE — ED Notes (Signed)
Bed: PQ98 Expected date:  Expected time:  Means of arrival:  Comments: Tr 2

## 2017-09-01 DIAGNOSIS — R338 Other retention of urine: Secondary | ICD-10-CM | POA: Diagnosis not present

## 2017-09-01 DIAGNOSIS — N401 Enlarged prostate with lower urinary tract symptoms: Secondary | ICD-10-CM | POA: Diagnosis not present

## 2017-09-02 ENCOUNTER — Other Ambulatory Visit: Payer: Self-pay | Admitting: Urology

## 2017-09-02 DIAGNOSIS — R338 Other retention of urine: Secondary | ICD-10-CM | POA: Diagnosis not present

## 2017-09-05 ENCOUNTER — Encounter (HOSPITAL_COMMUNITY): Payer: Self-pay

## 2017-09-05 ENCOUNTER — Other Ambulatory Visit: Payer: Self-pay

## 2017-09-05 ENCOUNTER — Encounter (HOSPITAL_COMMUNITY)
Admission: RE | Admit: 2017-09-05 | Discharge: 2017-09-05 | Disposition: A | Discharge status: Home / Self Care | Payer: BLUE CROSS/BLUE SHIELD | Source: Ambulatory Visit | Attending: Urology | Admitting: Urology

## 2017-09-05 DIAGNOSIS — D291 Benign neoplasm of prostate: Secondary | ICD-10-CM | POA: Diagnosis not present

## 2017-09-05 DIAGNOSIS — I10 Essential (primary) hypertension: Secondary | ICD-10-CM | POA: Diagnosis not present

## 2017-09-05 DIAGNOSIS — I739 Peripheral vascular disease, unspecified: Secondary | ICD-10-CM | POA: Diagnosis not present

## 2017-09-05 DIAGNOSIS — Z01812 Encounter for preprocedural laboratory examination: Secondary | ICD-10-CM | POA: Diagnosis not present

## 2017-09-05 DIAGNOSIS — E119 Type 2 diabetes mellitus without complications: Secondary | ICD-10-CM | POA: Diagnosis not present

## 2017-09-05 LAB — CBC
HEMATOCRIT: 29.5 % — AB (ref 39.0–52.0)
HEMOGLOBIN: 9.7 g/dL — AB (ref 13.0–17.0)
MCH: 30.3 pg (ref 26.0–34.0)
MCHC: 32.9 g/dL (ref 30.0–36.0)
MCV: 92.2 fL (ref 78.0–100.0)
Platelets: 212 10*3/uL (ref 150–400)
RBC: 3.2 MIL/uL — AB (ref 4.22–5.81)
RDW: 15.5 % (ref 11.5–15.5)
WBC: 9.1 10*3/uL (ref 4.0–10.5)

## 2017-09-05 LAB — BASIC METABOLIC PANEL
ANION GAP: 7 (ref 5–15)
BUN: 33 mg/dL — ABNORMAL HIGH (ref 6–20)
CHLORIDE: 115 mmol/L — AB (ref 101–111)
CO2: 20 mmol/L — AB (ref 22–32)
Calcium: 9.2 mg/dL (ref 8.9–10.3)
Creatinine, Ser: 1.62 mg/dL — ABNORMAL HIGH (ref 0.61–1.24)
GFR calc Af Amer: 51 mL/min — ABNORMAL LOW (ref >60)
GFR calc non Af Amer: 44 mL/min — ABNORMAL LOW (ref >60)
GLUCOSE: 115 mg/dL — AB (ref 65–99)
POTASSIUM: 4.6 mmol/L (ref 3.5–5.1)
Sodium: 142 mmol/L (ref 135–145)

## 2017-09-05 LAB — GLUCOSE, CAPILLARY: GLUCOSE-CAPILLARY: 121 mg/dL — AB (ref 65–99)

## 2017-09-05 LAB — HEMOGLOBIN A1C
Hgb A1c MFr Bld: 6.8 % — ABNORMAL HIGH (ref 4.8–5.6)
MEAN PLASMA GLUCOSE: 148.46 mg/dL

## 2017-09-05 NOTE — Progress Notes (Signed)
Cbc and bmp done 09-05-17 routed to Dr, Jeffie Pollock via epic

## 2017-09-05 NOTE — Patient Instructions (Signed)
Jerry Weiss  09/05/2017   Your procedure is scheduled on: 09-08-17  Report to [facility] Main  Entrance  Report to admitting at      [place] AM    Call this number if you have problems the morning of surgery [phone]   Remember: Do not eat food or drink liquids :After Midnight.     Take these medicines the morning of surgery with A SIP OF WATER: protonix, labatelol, hydralazine, amlodipine DO NOT TAKE ANY DIABETIC MEDICATIONS DAY OF YOUR SURGERY                               You may not have any metal on your body including hair pins and              piercings  Do not wear jewelry,  lotions, powders or perfumes, deodorant    .              Men may shave face and neck.   Do not bring valuables to the hospital. [place].  Contacts, dentures or bridgework may not be worn into surgery.  Leave suitcase in the car. After surgery it may be brought to your room.                  Please read over the following fact sheets you were given: _____________________________________________________________________          [facility] - Preparing for Surgery Before surgery, you can play an important role.  Because skin is not sterile, your skin needs to be as free of germs as possible.  You can reduce the number of germs on your skin by washing with CHG (chlorahexidine gluconate) soap before surgery.  CHG is an antiseptic cleaner which kills germs and bonds with the skin to continue killing germs even after washing. Please DO NOT use if you have an allergy to CHG or antibacterial soaps.  If your skin becomes reddened/irritated stop using the CHG and inform your nurse when you arrive at Short Stay. Do not shave (including legs and underarms) for at least 48 hours prior to the first CHG shower.  You may shave your face/neck. Please follow these instructions carefully:  1.  Shower with CHG Soap the night before surgery and the   morning of Surgery.  2.  If you choose to wash your hair, wash your hair first as usual with your  normal  shampoo.  3.  After you shampoo, rinse your hair and body thoroughly to remove the  shampoo.                           4.  Use CHG as you would any other liquid soap.  You can apply chg directly  to the skin and wash                       Gently with a scrungie or clean washcloth.  5.  Apply the CHG Soap to your body ONLY FROM THE NECK DOWN.   Do not use on face/ open  Wound or open sores. Avoid contact with eyes, ears mouth and genitals (private parts).                       Wash face,  Genitals (private parts) with your normal soap.             6.  Wash thoroughly, paying special attention to the area where your surgery  will be performed.  7.  Thoroughly rinse your body with warm water from the neck down.  8.  DO NOT shower/wash with your normal soap after using and rinsing off  the CHG Soap.                9.  Pat yourself dry with a clean towel.            10.  Wear clean pajamas.            11.  Place clean sheets on your bed the night of your first shower and do not  sleep with pets. Day of Surgery : Do not apply any lotions/deodorants the morning of surgery.  Please wear clean clothes to the hospital/surgery center.  FAILURE TO FOLLOW THESE INSTRUCTIONS MAY RESULT IN THE CANCELLATION OF YOUR SURGERY PATIENT SIGNATURE_________________________________  NURSE SIGNATURE__________________________________  ________________________________________________________________________

## 2017-09-05 NOTE — Progress Notes (Signed)
ekg 07-25-17 epic

## 2017-09-07 NOTE — H&P (Signed)
CC/HPI: CC: Urinary retention.   09/01/17: Jerry Weiss returns today in f/u for his recent history of postop retention following a partial nephrectomy on 07/28/17. He has failed voiding trials on 6/5 and 6/13. He voided for a bit after 6/13 but then had to go the ER on 6/15 for >575m. He didn't wait as long. He remains on tamsulosin 0.469mbid. He finished doxycycline 2-3 days ago. He had a prostate biopsy in 12/14 that was benign and his prostate was 7528mHe is a diabetic and has some mild decreased sensation in the toes.     ALLERGIES: Ciprofloxacin Codeine Derivatives shell fish    MEDICATIONS: Lisinopril  Amlodipine Besylate  Aspirin Ec 81 mg tablet, delayed release  Hydralazine Hcl  Januvia  Labetalol Hcl  Multi-Vitamin TABS Oral  Probiotic  Tamsulosin Hcl 0.4 mg capsule 2 capsule PO Daily     GU PSH: Partial nephrectomy (laparoscopic), Right - 07/28/2017 Prostate Needle Biopsy - about 2014      PSH Notes: hiatal hernia  aortic dissection     NON-GU PSH: EGD Hernia Repair - 2011    GU PMH: Urinary Retention - 08/16/2017 Renal cell carcinoma, right - 08/03/2017 Right renal neoplasm - 4/23/8/3291ght uncertain neoplasm of kidney, He has a 3.3cm RUP mass and an 8mm50mP mass which are suspicious for neoplasms. 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 I am going to get him set up to see one of our renal surgeons for further evaluation and management. - 06/02/2017 BPH w/LUTS (Stable), He has stable voiding symptoms on alfuzosin. I have refilled the med. He will return in a year. - 09/08/2016, - 09/08/2015, Benign prostatic hyperplasia with urinary obstruction, - 2016 Elevated PSA, Elevated prostate specific antigen (PSA) - 2016 Urinary Urgency, Urinary urgency - 2016 Nocturia, Nocturia - 2014 Testicular atrophy, Testicular Atrophy - 2014 Testicular pain, unspecified, Testicular Pain - 2014 Urinary Frequency, Increased urinary frequency - 2014      PMH Notes:   1) Renal cell carcinoma: He is  s/p a right partial nephrectomy on 07/28/17 for multiple tumors (3.5 cm and 1.5 cm) on a solitary functional right kidney (his left renal artery thrombosed after an aortic dissection).   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  Has never drank.  Does not drink caffeine. Has not had a blood transfusion. Patient's occupation is/w[employer] REVIEW OF SYSTEMS:    GU Review Male:   Patient denies get up at night to urinate, leakage of urine, stream starts and stops, trouble starting your stream, have to strain to urinate , erection problems, and penile pain.  Gastrointestinal (Upper):   Patient reports nausea. Patient denies vomiting and indigestion/ heartburn.  Gastrointestinal (Lower):   Patient denies diarrhea and constipation.  Constitutional:   Patient denies fever, night sweats, weight loss, and fatigue.  Skin:   Patient denies skin rash/ lesion and itching.  Eyes:   Patient denies double vision and blurred vision.  Ears/ Nose/ Throat:   Patient denies sore throat and sinus problems.  Hematologic/Lymphatic:   Patient denies swollen glands and easy bruising.  Cardiovascular:   Patient denies leg swelling and chest pains.  Respiratory:   Patient denies cough and shortness of breath.  Endocrine:   Patient denies excessive thirst.  Musculoskeletal:    Patient denies back pain and joint pain.  Neurological:   Patient denies headaches and dizziness.  Psychologic:   Patient denies depression and anxiety.   Notes: PT HAS CATHETER PLACED IN ER ON SATURDAY     VITAL SIGNS:      09/01/2017 02:35 PM  Weight 185 lb / 83.91 kg  Height 67 in / 170.18 cm  BP 109/69 mmHg  Heart Rate 68 /min  Temperature 97.0 F / 36.1 C  BMI 29.0 kg/m   MULTI-SYSTEM PHYSICAL EXAMINATION:    Constitutional: Well-nourished. No physical deformities. Normally developed. Good grooming.  Respiratory: No labored breathing, no use of accessory muscles. Normal breath sounds.  Cardiovascular: Regular rate and rhythm. No murmur, no gallop. Normal temperature, , no swelling, no varicosities.      PAST DATA REVIEWED:  Source Of History:  Patient   09/01/16 09/02/15 08/29/14 08/28/13 12/26/12 11/23/12 06/17/10  PSA  Total PSA 3.28 ng/mL 3.17  3.10  2.98  3.94  3.18  2.01   Free PSA 0.55 ng/mL    0.61     % Free PSA 17 % PSA    15       PROCEDURES:         Flexible Cystoscopy - 52000  Risks, benefits, and some of the potential complications of the procedure were discussed. Foley was removed and 68m of 2% lidocaine jelly was instilled intraurethrally.  I gave him bactrim to take post procedure and 2 tabs to use BID tomorrow.    Meatus:  Normal size. Normal location. Normal condition.  Urethra:  No strictures.  External Sphincter:  Normal.  Verumontanum:  Normal.  Prostate:  Obstructing. Moderate hyperplasia. without a middle lobe  Bladder Neck:  Non-obstructing.  Ureteral Orifices:  difficult to visualized because of cloudy urine with debris and catheter irritation.   Bladder:  erythema and inflammation from the foley catheter. Some trabeculation and edema as well.      The procedure was well tolerated and there were no complications.  He voided for a flowrate after the procedure.           Flow Rate - 51741  Flow Time: 0:42 min:sec  Voided Volume: 141  cc  Peak Flow Rate: 3 cc/sec   ASSESSMENT:      ICD-10 Details  1 GU:   BPH w/LUTS - N40.1 He was able to void with a weak stream post cystoscopy. I am going to have him return for a PVR tomorrow and covered him with bactrim. He is probably going to need a TURP and I will tentatively arrange that for next week. I reviewed alternatives including finasteride and Urolift but feel TURP will be most effective. I reviewd the risks of a TURP including bleeding, infection, incontinence, stricture, need for secondary procedures, ejaculatory and erectile dysfunction, thrombotic events, fluid overload and anesthetic complications. I explained that 95% of men will have relief of the obstructive symptoms and about 70% will have relief of the irritative symptoms.   2   Urinary Retention - R33.8    PLAN:  Schedule Return Visit/Planned Activity: 1 Day - Nurse Visit, PVR             Note: Needs to come in tomorrow PM for a PVR to see how he is doing before the weekend.   Return Visit/Planned Activity: Next Available Appointment - Schedule Surgery  Procedure: Approximately 1 Week - Cystoscopy TURP - 75301          Document Letter(s):  Created for Patient: Clinical Summary         Notes:   CC: Dr. Myriam Jacobson.        Next Appointment:      Next Appointment: 09/02/2017 01:30 PM    Appointment Type: Nurse Visit    Location: [facility] [phone]    Provider: NVPodA NVPodA    Reason for Visit: PVR

## 2017-09-08 ENCOUNTER — Ambulatory Visit (HOSPITAL_COMMUNITY): Payer: BLUE CROSS/BLUE SHIELD | Admitting: Anesthesiology

## 2017-09-08 ENCOUNTER — Observation Stay (HOSPITAL_COMMUNITY)
Admission: RE | Admit: 2017-09-08 | Discharge: 2017-09-09 | Disposition: A | Discharge status: Home / Self Care | Payer: BLUE CROSS/BLUE SHIELD | Source: Ambulatory Visit | Attending: Urology | Admitting: Urology

## 2017-09-08 ENCOUNTER — Encounter (HOSPITAL_COMMUNITY): Payer: Self-pay | Admitting: *Deleted

## 2017-09-08 ENCOUNTER — Other Ambulatory Visit: Payer: Self-pay

## 2017-09-08 ENCOUNTER — Encounter (HOSPITAL_COMMUNITY)
Admission: RE | Disposition: A | Discharge status: Home / Self Care | Payer: Self-pay | Source: Ambulatory Visit | Attending: Urology

## 2017-09-08 DIAGNOSIS — Z79899 Other long term (current) drug therapy: Secondary | ICD-10-CM | POA: Insufficient documentation

## 2017-09-08 DIAGNOSIS — N138 Other obstructive and reflux uropathy: Secondary | ICD-10-CM | POA: Diagnosis not present

## 2017-09-08 DIAGNOSIS — Z885 Allergy status to narcotic agent status: Secondary | ICD-10-CM | POA: Diagnosis not present

## 2017-09-08 DIAGNOSIS — Z85520 Personal history of malignant carcinoid tumor of kidney: Secondary | ICD-10-CM | POA: Insufficient documentation

## 2017-09-08 DIAGNOSIS — Z91013 Allergy to seafood: Secondary | ICD-10-CM | POA: Insufficient documentation

## 2017-09-08 DIAGNOSIS — N400 Enlarged prostate without lower urinary tract symptoms: Secondary | ICD-10-CM | POA: Diagnosis not present

## 2017-09-08 DIAGNOSIS — Z7982 Long term (current) use of aspirin: Secondary | ICD-10-CM | POA: Diagnosis not present

## 2017-09-08 DIAGNOSIS — E1151 Type 2 diabetes mellitus with diabetic peripheral angiopathy without gangrene: Secondary | ICD-10-CM | POA: Diagnosis not present

## 2017-09-08 DIAGNOSIS — K219 Gastro-esophageal reflux disease without esophagitis: Secondary | ICD-10-CM | POA: Diagnosis not present

## 2017-09-08 DIAGNOSIS — R338 Other retention of urine: Secondary | ICD-10-CM | POA: Insufficient documentation

## 2017-09-08 DIAGNOSIS — I10 Essential (primary) hypertension: Secondary | ICD-10-CM | POA: Insufficient documentation

## 2017-09-08 DIAGNOSIS — N401 Enlarged prostate with lower urinary tract symptoms: Secondary | ICD-10-CM | POA: Diagnosis not present

## 2017-09-08 DIAGNOSIS — Z7984 Long term (current) use of oral hypoglycemic drugs: Secondary | ICD-10-CM | POA: Insufficient documentation

## 2017-09-08 DIAGNOSIS — G4730 Sleep apnea, unspecified: Secondary | ICD-10-CM | POA: Insufficient documentation

## 2017-09-08 DIAGNOSIS — Z9989 Dependence on other enabling machines and devices: Secondary | ICD-10-CM | POA: Insufficient documentation

## 2017-09-08 DIAGNOSIS — G4733 Obstructive sleep apnea (adult) (pediatric): Secondary | ICD-10-CM | POA: Diagnosis not present

## 2017-09-08 HISTORY — PX: TRANSURETHRAL RESECTION OF PROSTATE: SHX73

## 2017-09-08 LAB — GLUCOSE, CAPILLARY
GLUCOSE-CAPILLARY: 106 mg/dL — AB (ref 70–99)
GLUCOSE-CAPILLARY: 121 mg/dL — AB (ref 70–99)
GLUCOSE-CAPILLARY: 284 mg/dL — AB (ref 70–99)
Glucose-Capillary: 133 mg/dL — ABNORMAL HIGH (ref 70–99)
Glucose-Capillary: 240 mg/dL — ABNORMAL HIGH (ref 70–99)

## 2017-09-08 SURGERY — TURP (TRANSURETHRAL RESECTION OF PROSTATE)
Anesthesia: General | Site: Prostate

## 2017-09-08 MED ORDER — SODIUM CHLORIDE 0.9 % IR SOLN
Status: DC | PRN
  Administered 2017-09-08 (×3): 6000 mL

## 2017-09-08 MED ORDER — LACTATED RINGERS IV SOLN
INTRAVENOUS | Status: DC
  Administered 2017-09-08: 07:00:00 via INTRAVENOUS

## 2017-09-08 MED ORDER — LINAGLIPTIN 5 MG PO TABS
5.0000 mg | ORAL_TABLET | Freq: Every day | ORAL | Status: DC
  Administered 2017-09-09: 5 mg via ORAL
  Filled 2017-09-08: qty 1

## 2017-09-08 MED ORDER — HYOSCYAMINE SULFATE 0.125 MG SL SUBL
0.1250 mg | SUBLINGUAL_TABLET | SUBLINGUAL | Status: DC | PRN
  Administered 2017-09-08 – 2017-09-09 (×2): 0.125 mg via SUBLINGUAL
  Filled 2017-09-08 (×4): qty 1

## 2017-09-08 MED ORDER — PROPOFOL 10 MG/ML IV BOLUS
INTRAVENOUS | Status: DC | PRN
  Administered 2017-09-08: 50 mg via INTRAVENOUS
  Administered 2017-09-08: 200 mg via INTRAVENOUS

## 2017-09-08 MED ORDER — PROMETHAZINE HCL 25 MG/ML IJ SOLN: 6.2500 mg | INTRAMUSCULAR | Status: DC | PRN

## 2017-09-08 MED ORDER — LABETALOL HCL 200 MG PO TABS
400.0000 mg | ORAL_TABLET | Freq: Three times a day (TID) | ORAL | Status: DC
  Administered 2017-09-08 – 2017-09-09 (×3): 400 mg via ORAL
  Filled 2017-09-08 (×3): qty 2

## 2017-09-08 MED ORDER — PHENYLEPHRINE HCL 10 MG/ML IJ SOLN
INTRAMUSCULAR | Status: AC
  Filled 2017-09-08: qty 1

## 2017-09-08 MED ORDER — FENTANYL CITRATE (PF) 100 MCG/2ML IJ SOLN
25.0000 ug | INTRAMUSCULAR | Status: DC | PRN
  Administered 2017-09-08 (×3): 50 ug via INTRAVENOUS

## 2017-09-08 MED ORDER — DEXAMETHASONE SODIUM PHOSPHATE 10 MG/ML IJ SOLN
INTRAMUSCULAR | Status: DC | PRN
  Administered 2017-09-08: 8 mg via INTRAVENOUS

## 2017-09-08 MED ORDER — ACETAMINOPHEN 325 MG PO TABS: 650.0000 mg | ORAL_TABLET | ORAL | Status: DC | PRN

## 2017-09-08 MED ORDER — FENTANYL CITRATE (PF) 100 MCG/2ML IJ SOLN
INTRAMUSCULAR | Status: AC
  Filled 2017-09-08: qty 2

## 2017-09-08 MED ORDER — AMLODIPINE BESYLATE 10 MG PO TABS
10.0000 mg | ORAL_TABLET | Freq: Every day | ORAL | Status: DC
  Administered 2017-09-09: 10 mg via ORAL
  Filled 2017-09-08: qty 1

## 2017-09-08 MED ORDER — MIDAZOLAM HCL 2 MG/2ML IJ SOLN
INTRAMUSCULAR | Status: AC
  Filled 2017-09-08: qty 2

## 2017-09-08 MED ORDER — PHENYLEPHRINE 40 MCG/ML (10ML) SYRINGE FOR IV PUSH (FOR BLOOD PRESSURE SUPPORT)
PREFILLED_SYRINGE | INTRAVENOUS | Status: DC | PRN
  Administered 2017-09-08 (×2): 80 ug via INTRAVENOUS

## 2017-09-08 MED ORDER — FLEET ENEMA 7-19 GM/118ML RE ENEM: 1.0000 | ENEMA | Freq: Once | RECTAL | Status: DC | PRN

## 2017-09-08 MED ORDER — PHENYLEPHRINE 40 MCG/ML (10ML) SYRINGE FOR IV PUSH (FOR BLOOD PRESSURE SUPPORT)
PREFILLED_SYRINGE | INTRAVENOUS | Status: AC
  Filled 2017-09-08: qty 10

## 2017-09-08 MED ORDER — POTASSIUM CHLORIDE IN NACL 20-0.45 MEQ/L-% IV SOLN
INTRAVENOUS | Status: DC
  Administered 2017-09-08 (×2): via INTRAVENOUS
  Filled 2017-09-08 (×4): qty 1000

## 2017-09-08 MED ORDER — FENTANYL CITRATE (PF) 100 MCG/2ML IJ SOLN
INTRAMUSCULAR | Status: DC | PRN
  Administered 2017-09-08: 50 ug via INTRAVENOUS
  Administered 2017-09-08 (×2): 25 ug via INTRAVENOUS

## 2017-09-08 MED ORDER — DEXAMETHASONE SODIUM PHOSPHATE 10 MG/ML IJ SOLN
INTRAMUSCULAR | Status: AC
  Filled 2017-09-08: qty 1

## 2017-09-08 MED ORDER — PANTOPRAZOLE SODIUM 20 MG PO TBEC
20.0000 mg | DELAYED_RELEASE_TABLET | Freq: Every day | ORAL | Status: DC
  Administered 2017-09-08 – 2017-09-09 (×2): 20 mg via ORAL
  Filled 2017-09-08 (×2): qty 1

## 2017-09-08 MED ORDER — SODIUM CHLORIDE 0.9 % IR SOLN: 3000.0000 mL | Status: DC

## 2017-09-08 MED ORDER — LIDOCAINE 2% (20 MG/ML) 5 ML SYRINGE
INTRAMUSCULAR | Status: AC
  Filled 2017-09-08: qty 5

## 2017-09-08 MED ORDER — MIDAZOLAM HCL 5 MG/5ML IJ SOLN
INTRAMUSCULAR | Status: DC | PRN
  Administered 2017-09-08: 1 mg via INTRAVENOUS

## 2017-09-08 MED ORDER — PIOGLITAZONE HCL 15 MG PO TABS
15.0000 mg | ORAL_TABLET | Freq: Every day | ORAL | Status: DC
  Administered 2017-09-09: 15 mg via ORAL
  Filled 2017-09-08 (×2): qty 1

## 2017-09-08 MED ORDER — LISINOPRIL 20 MG PO TABS
40.0000 mg | ORAL_TABLET | Freq: Every day | ORAL | Status: DC
  Administered 2017-09-09: 40 mg via ORAL
  Filled 2017-09-08: qty 2

## 2017-09-08 MED ORDER — HYDRALAZINE HCL 50 MG PO TABS
100.0000 mg | ORAL_TABLET | Freq: Three times a day (TID) | ORAL | Status: DC
  Administered 2017-09-08 – 2017-09-09 (×3): 100 mg via ORAL
  Filled 2017-09-08 (×3): qty 2

## 2017-09-08 MED ORDER — NEOMYCIN-POLYMYXIN-DEXAMETH 0.1 % OP OINT
1.0000 "application " | TOPICAL_OINTMENT | Freq: Every day | OPHTHALMIC | Status: DC | PRN
  Filled 2017-09-08: qty 3.5

## 2017-09-08 MED ORDER — ONDANSETRON HCL 4 MG/2ML IJ SOLN
INTRAMUSCULAR | Status: AC
  Filled 2017-09-08: qty 2

## 2017-09-08 MED ORDER — GABAPENTIN 300 MG PO CAPS
300.0000 mg | ORAL_CAPSULE | Freq: Every day | ORAL | Status: DC
  Filled 2017-09-08: qty 1

## 2017-09-08 MED ORDER — HYDROMORPHONE HCL 1 MG/ML IJ SOLN: 0.5000 mg | INTRAMUSCULAR | Status: DC | PRN

## 2017-09-08 MED ORDER — PROPOFOL 10 MG/ML IV BOLUS
INTRAVENOUS | Status: AC
  Filled 2017-09-08: qty 20

## 2017-09-08 MED ORDER — BISACODYL 10 MG RE SUPP: 10.0000 mg | Freq: Every day | RECTAL | Status: DC | PRN

## 2017-09-08 MED ORDER — LIDOCAINE 2% (20 MG/ML) 5 ML SYRINGE
INTRAMUSCULAR | Status: DC | PRN
  Administered 2017-09-08: 100 mg via INTRAVENOUS

## 2017-09-08 MED ORDER — KETOROLAC TROMETHAMINE 30 MG/ML IJ SOLN: 30.0000 mg | Freq: Once | INTRAMUSCULAR | Status: DC | PRN

## 2017-09-08 MED ORDER — DIPHENHYDRAMINE HCL 50 MG/ML IJ SOLN
INTRAMUSCULAR | Status: AC
  Filled 2017-09-08: qty 1

## 2017-09-08 MED ORDER — DIPHENHYDRAMINE HCL 50 MG/ML IJ SOLN: 12.5000 mg | Freq: Once | INTRAMUSCULAR | Status: DC

## 2017-09-08 MED ORDER — HYDROCODONE-ACETAMINOPHEN 5-325 MG PO TABS
1.0000 | ORAL_TABLET | ORAL | Status: DC | PRN
  Administered 2017-09-08: 2 via ORAL
  Administered 2017-09-09: 1 via ORAL
  Filled 2017-09-08: qty 1
  Filled 2017-09-08: qty 2

## 2017-09-08 MED ORDER — SENNOSIDES-DOCUSATE SODIUM 8.6-50 MG PO TABS: 1.0000 | ORAL_TABLET | Freq: Every evening | ORAL | Status: DC | PRN

## 2017-09-08 MED ORDER — BELLADONNA ALKALOIDS-OPIUM 16.2-60 MG RE SUPP
1.0000 | Freq: Once | RECTAL | Status: AC
  Administered 2017-09-08: 1 via RECTAL
  Filled 2017-09-08: qty 1

## 2017-09-08 MED ORDER — ONDANSETRON HCL 4 MG/2ML IJ SOLN: 4.0000 mg | INTRAMUSCULAR | Status: DC | PRN

## 2017-09-08 MED ORDER — EPHEDRINE SULFATE-NACL 50-0.9 MG/10ML-% IV SOSY
PREFILLED_SYRINGE | INTRAVENOUS | Status: DC | PRN
  Administered 2017-09-08 (×5): 10 mg via INTRAVENOUS

## 2017-09-08 MED ORDER — INSULIN ASPART 100 UNIT/ML ~~LOC~~ SOLN
0.0000 [IU] | Freq: Three times a day (TID) | SUBCUTANEOUS | Status: DC
  Administered 2017-09-08: 8 [IU] via SUBCUTANEOUS
  Administered 2017-09-08: 2 [IU] via SUBCUTANEOUS

## 2017-09-08 MED ORDER — PHENYLEPHRINE HCL 10 MG/ML IJ SOLN
INTRAMUSCULAR | Status: DC | PRN
  Administered 2017-09-08: 25 ug/min via INTRAVENOUS

## 2017-09-08 MED ORDER — CEFAZOLIN SODIUM-DEXTROSE 2-4 GM/100ML-% IV SOLN
2.0000 g | INTRAVENOUS | Status: AC
  Administered 2017-09-08: 2 g via INTRAVENOUS
  Filled 2017-09-08: qty 100

## 2017-09-08 MED ORDER — CEFAZOLIN SODIUM-DEXTROSE 1-4 GM/50ML-% IV SOLN
1.0000 g | Freq: Three times a day (TID) | INTRAVENOUS | Status: DC
  Administered 2017-09-08 – 2017-09-09 (×2): 1 g via INTRAVENOUS
  Filled 2017-09-08 (×3): qty 50

## 2017-09-08 MED ORDER — EPHEDRINE 5 MG/ML INJ
INTRAVENOUS | Status: AC
  Filled 2017-09-08: qty 10

## 2017-09-08 MED ORDER — ONDANSETRON HCL 4 MG/2ML IJ SOLN
INTRAMUSCULAR | Status: DC | PRN
  Administered 2017-09-08: 4 mg via INTRAVENOUS

## 2017-09-08 SURGICAL SUPPLY — 17 items
BAG URINE DRAINAGE (UROLOGICAL SUPPLIES) IMPLANT
BAG URO CATCHER STRL LF (MISCELLANEOUS) ×2 IMPLANT
CATH FOLEY 3WAY 30CC 22FR (CATHETERS) IMPLANT
COVER FOOTSWITCH UNIV (MISCELLANEOUS) IMPLANT
COVER SURGICAL LIGHT HANDLE (MISCELLANEOUS) IMPLANT
ELECT REM PT RETURN 15FT ADLT (MISCELLANEOUS) IMPLANT
GLOVE SURG SS PI 8.0 STRL IVOR (GLOVE) IMPLANT
GOWN STRL REUS W/TWL XL LVL3 (GOWN DISPOSABLE) ×4 IMPLANT
HOLDER FOLEY CATH W/STRAP (MISCELLANEOUS) IMPLANT
LOOP CUT BIPOLAR 24F LRG (ELECTROSURGICAL) ×2 IMPLANT
MANIFOLD NEPTUNE II (INSTRUMENTS) ×2 IMPLANT
PACK CYSTO (CUSTOM PROCEDURE TRAY) ×2 IMPLANT
SET ASPIRATION TUBING (TUBING) ×2 IMPLANT
SYR 30ML LL (SYRINGE) ×2 IMPLANT
SYRINGE IRR TOOMEY STRL 70CC (SYRINGE) IMPLANT
TUBING CONNECTING 10 (TUBING) ×2 IMPLANT
TUBING UROLOGY SET (TUBING) IMPLANT

## 2017-09-08 NOTE — Op Note (Signed)
Procedure: Transurethral resection of the prostate.  Preop diagnosis: BPH with urinary retention.  Postop diagnosis: Same.  Surgeon: Dr. Irine Seal.  Anesthesia: General.  Specimen: Prostate chips.  Drain: 22 [place] Environmental health practitioner.  EBL: Minimal.  Complications: None.  Indications: Jerry Weiss is a 62 year old white male who had a recent partial nephrectomy and developed postoperative urinary retention 6 weeks ago.  He has had continued difficulty voiding with retention and is elected to undergo TURP.  Procedure: He was given 2 g of Ancef.  A general anesthetic was induced and he was placed in the lithotomy position.  PAS hose were placed.  His perineum and genitalia were prepped with Betadine solution and he was draped in usual sterile fashion.  A 26 French continuous-flow resectoscope sheath was inserted using the visual obturator and the 30 degree lens.  Examination revealed a normal urethra.  The external sphincter was intact.  The prosthetic urethra was 2 to 3 cm in length but there was a large intravesical middle lobe that was the primary cause of obstruction.  He had minimal lateral lobe hyperplasia.  Examination of bladder revealed some catheter irritation.  There was mild trabeculation.  No tumors or stones were noted.  Ureteral orifices were away from the bladder neck.  The [facility] resectoscope with a bipolar loop and 30 degree lens were then used for the TURP.  The middle lobe was resected down to the bladder neck fibers and then the floor of the prostate was resected out to alongside the verumontanum.  The left lobe was resected from bladder neck to apex and this was followed by the right lobe.  Anterior resection was not required.  The bladder was evacuated free of chips and additional resection was performed at the bladder neck and at the apex to complete the resection.  Those chips were removed and final hemostasis was achieved.  Final inspection revealed intact ureteral orifice  ease, no retained chips, no active bleeding and an intact external sphincter.  The resectoscope was removed and pressure on the bladder produced an excellent stream.  A 22 French three-way Foley catheter was inserted with the aid of a catheter guide and the balloon was filled with 30 mL of sterile water.  The catheter was irrigated with clear return in place to continuous irrigation and straight drainage.  He was taken down from the lithotomy position, his anesthetic was reversed and he was moved to recovery in stable condition.  There were no complications.

## 2017-09-08 NOTE — Progress Notes (Signed)
Patient declines the use of nocturnal CPAP tonight. However, he does request that the equipment be placed at the bedside in the event that he should change his mind. Machine and supplies are on standby as requested. Patient has agreed to call for assistance and is aware that he may call at any time. RT will continue to follow.

## 2017-09-08 NOTE — Anesthesia Procedure Notes (Signed)
Procedure Name: LMA Insertion Date/Time: 09/08/2017 8:17 AM Performed by: Maxwell Caul, CRNA Pre-anesthesia Checklist: Patient identified, Emergency Drugs available, Suction available and Patient being monitored Patient Re-evaluated:Patient Re-evaluated prior to induction Oxygen Delivery Method: Circle system utilized Preoxygenation: Pre-oxygenation with 100% oxygen Induction Type: IV induction LMA: LMA inserted LMA Size: 4.0 Number of attempts: 1 Placement Confirmation: positive ETCO2 and breath sounds checked- equal and bilateral Tube secured with: Tape Dental Injury: Teeth and Oropharynx as per pre-operative assessment

## 2017-09-08 NOTE — Interval H&P Note (Signed)
History and Physical Interval Note:  No changed  09/08/2017 8:02 AM  Jerry Weiss  has presented today for surgery, with the diagnosis of [place]  The various methods of treatment have been discussed with the patient and family. After consideration of risks, benefits and other options for treatment, the patient has consented to  Procedure(s): TRANSURETHRAL RESECTION OF THE PROSTATE (TURP) (N/A) as a surgical intervention .  The patient's history has been reviewed, patient examined, no change in status, stable for surgery.  I have reviewed the patient's chart and labs.  Questions were answered to the patient's satisfaction.     Irine Seal

## 2017-09-08 NOTE — Progress Notes (Signed)
Assumed care of this patient from prior RN. Agree with previous assessment. Will continue to monitor patient closely.

## 2017-09-08 NOTE — Anesthesia Postprocedure Evaluation (Signed)
Anesthesia Post Note  Patient: Jerry Weiss  Procedure(s) Performed: TRANSURETHRAL RESECTION OF THE PROSTATE (TURP) (N/A Prostate)     Patient location during evaluation: PACU Anesthesia Type: General Level of consciousness: awake and alert Pain management: pain level controlled Vital Signs Assessment: post-procedure vital signs reviewed and stable Respiratory status: spontaneous breathing, nonlabored ventilation, respiratory function stable and patient connected to nasal cannula oxygen Cardiovascular status: blood pressure returned to baseline and stable Postop Assessment: no apparent nausea or vomiting Anesthetic complications: no    Last Vitals:  Vitals:   09/08/17 1030 09/08/17 1054  BP: (!) 154/73 (!) 155/82  Pulse: 70 75  Resp: 14 16  Temp: 36.4 C 36.5 C  SpO2: 98% 97%    Last Pain:  Vitals:   09/08/17 1154  TempSrc:   PainSc: 7                  Ruwayda Curet S

## 2017-09-08 NOTE — Plan of Care (Signed)
  Problem: Nutrition: Goal: Adequate nutrition will be maintained Outcome: Progressing   Problem: Pain Managment: Goal: General experience of comfort will improve Outcome: Progressing   Problem: Elimination: Goal: Will not experience complications related to bowel motility Outcome: Progressing

## 2017-09-08 NOTE — Anesthesia Preprocedure Evaluation (Signed)
Anesthesia Evaluation  Patient identified by MRN, date of birth, ID band Patient awake    Reviewed: Allergy & Precautions, NPO status , Patient's Chart, lab work & pertinent test results  Airway Mallampati: II  TM Distance: >3 FB Neck ROM: Full    Dental no notable dental hx.    Pulmonary sleep apnea and Continuous Positive Airway Pressure Ventilation ,    Pulmonary exam normal breath sounds clear to auscultation       Cardiovascular hypertension, + Peripheral Vascular Disease  Normal cardiovascular exam Rhythm:Regular Rate:Normal     Neuro/Psych negative neurological ROS  negative psych ROS   GI/Hepatic negative GI ROS, Neg liver ROS,   Endo/Other  negative endocrine ROSdiabetes  Renal/GU negative Renal ROS  negative genitourinary   Musculoskeletal negative musculoskeletal ROS (+)   Abdominal   Peds negative pediatric ROS (+)  Hematology negative hematology ROS (+)   Anesthesia Other Findings   Reproductive/Obstetrics negative OB ROS                             Anesthesia Physical Anesthesia Plan  ASA: III  Anesthesia Plan: General   Post-op Pain Management:    Induction: Intravenous  PONV Risk Score and Plan: 2 and Ondansetron, Dexamethasone and Treatment may vary due to age or medical condition  Airway Management Planned: LMA  Additional Equipment:   Intra-op Plan:   Post-operative Plan: Extubation in OR  Informed Consent: I have reviewed the patients History and Physical, chart, labs and discussed the procedure including the risks, benefits and alternatives for the proposed anesthesia with the patient or authorized representative who has indicated his/her understanding and acceptance.   Dental advisory given  Plan Discussed with: CRNA and Surgeon  Anesthesia Plan Comments:         Anesthesia Quick Evaluation

## 2017-09-08 NOTE — Progress Notes (Signed)
Pt requesting something for sleep and states, "I have a lot of trouble sleeping and barely sleep at home. I tried Ambien at the other hospital and it did not touch me it was like a placebo."  Jiles Crocker, NP on call paged and did not give any new orders at this time. Will continue to monitor pt closely. Carnella [place] I

## 2017-09-08 NOTE — Transfer of Care (Signed)
Immediate Anesthesia Transfer of Care Note  Patient: Jerry Weiss  Procedure(s) Performed: TRANSURETHRAL RESECTION OF THE PROSTATE (TURP) (N/A Prostate)  Patient Location: PACU  Anesthesia Type:General  Level of Consciousness: awake and alert   Airway & Oxygen Therapy: Patient Spontanous Breathing and Patient connected to face mask oxygen  Post-op Assessment: Report given to RN and Post -op Vital signs reviewed and stable  Post vital signs: Reviewed and stable  Last Vitals:  Vitals Value Taken Time  BP 127/65 09/08/2017  9:18 AM  Temp    Pulse 78 09/08/2017  9:21 AM  Resp 17 09/08/2017  9:21 AM  SpO2 100 % 09/08/2017  9:21 AM  Vitals shown include unvalidated device data.  Last Pain:  Vitals:   09/08/17 0635  TempSrc: Oral      Patients Stated Pain Goal: 3 (14/64/31 4276)  Complications: No apparent anesthesia complications

## 2017-09-09 ENCOUNTER — Encounter (HOSPITAL_COMMUNITY): Payer: Self-pay | Admitting: Urology

## 2017-09-09 DIAGNOSIS — Z7982 Long term (current) use of aspirin: Secondary | ICD-10-CM | POA: Diagnosis not present

## 2017-09-09 DIAGNOSIS — Z9989 Dependence on other enabling machines and devices: Secondary | ICD-10-CM | POA: Diagnosis not present

## 2017-09-09 DIAGNOSIS — G4730 Sleep apnea, unspecified: Secondary | ICD-10-CM | POA: Diagnosis not present

## 2017-09-09 DIAGNOSIS — Z79899 Other long term (current) drug therapy: Secondary | ICD-10-CM | POA: Diagnosis not present

## 2017-09-09 DIAGNOSIS — Z7984 Long term (current) use of oral hypoglycemic drugs: Secondary | ICD-10-CM | POA: Diagnosis not present

## 2017-09-09 DIAGNOSIS — I10 Essential (primary) hypertension: Secondary | ICD-10-CM | POA: Diagnosis not present

## 2017-09-09 DIAGNOSIS — N401 Enlarged prostate with lower urinary tract symptoms: Secondary | ICD-10-CM | POA: Diagnosis not present

## 2017-09-09 DIAGNOSIS — E1151 Type 2 diabetes mellitus with diabetic peripheral angiopathy without gangrene: Secondary | ICD-10-CM | POA: Diagnosis not present

## 2017-09-09 DIAGNOSIS — Z91013 Allergy to seafood: Secondary | ICD-10-CM | POA: Diagnosis not present

## 2017-09-09 DIAGNOSIS — K219 Gastro-esophageal reflux disease without esophagitis: Secondary | ICD-10-CM | POA: Diagnosis not present

## 2017-09-09 DIAGNOSIS — Z885 Allergy status to narcotic agent status: Secondary | ICD-10-CM | POA: Diagnosis not present

## 2017-09-09 DIAGNOSIS — R338 Other retention of urine: Secondary | ICD-10-CM | POA: Diagnosis not present

## 2017-09-09 DIAGNOSIS — Z85520 Personal history of malignant carcinoid tumor of kidney: Secondary | ICD-10-CM | POA: Diagnosis not present

## 2017-09-09 DIAGNOSIS — N138 Other obstructive and reflux uropathy: Secondary | ICD-10-CM | POA: Diagnosis not present

## 2017-09-09 LAB — BASIC METABOLIC PANEL
Anion gap: 7 (ref 5–15)
BUN: 27 mg/dL — ABNORMAL HIGH (ref 8–23)
CO2: 18 mmol/L — AB (ref 22–32)
Calcium: 8.7 mg/dL — ABNORMAL LOW (ref 8.9–10.3)
Chloride: 113 mmol/L — ABNORMAL HIGH (ref 98–111)
Creatinine, Ser: 1.62 mg/dL — ABNORMAL HIGH (ref 0.61–1.24)
GFR calc Af Amer: 51 mL/min — ABNORMAL LOW (ref >60)
GFR calc non Af Amer: 44 mL/min — ABNORMAL LOW (ref >60)
Glucose, Bld: 164 mg/dL — ABNORMAL HIGH (ref 70–99)
POTASSIUM: 4.4 mmol/L (ref 3.5–5.1)
Sodium: 138 mmol/L (ref 135–145)

## 2017-09-09 LAB — GLUCOSE, CAPILLARY: GLUCOSE-CAPILLARY: 102 mg/dL — AB (ref 70–99)

## 2017-09-09 LAB — HIV ANTIBODY (ROUTINE TESTING W REFLEX): HIV Screen 4th Generation wRfx: NONREACTIVE

## 2017-09-09 LAB — HEMOGLOBIN AND HEMATOCRIT, BLOOD
HCT: 29.5 % — ABNORMAL LOW (ref 39.0–52.0)
Hemoglobin: 9.5 g/dL — ABNORMAL LOW (ref 13.0–17.0)

## 2017-09-09 NOTE — Progress Notes (Signed)
D/cing to home w/ son. IV d/c'd. All d/c papers given and verbal understanding.

## 2017-09-09 NOTE — Discharge Summary (Signed)
Patient ID: Jerry Weiss MRN: [id number] DOB/AGE: 09/11/1955 62 y.o.  Admit date: 09/08/2017 Discharge date: 09/09/2017  Primary Care Physician:  [name], MD  Discharge Diagnoses:  N40.1 Present on Admission: . Urinary retention due to benign prostatic hyperplasia   Consults:  None   Discharge Medications: Allergies as of 09/09/2017      Reactions   Codeine Other (See Comments)   Hallucinations    Shellfish-derived Products Swelling      Medication List    STOP taking these medications   doxycycline 100 MG tablet Commonly known as:  VIBRA-TABS   HYDROcodone-acetaminophen 5-325 MG tablet Commonly known as:  NORCO/VICODIN     TAKE these medications   amLODipine 10 MG tablet Commonly known as:  NORVASC Take 10 mg by mouth daily.   gabapentin 300 MG capsule Commonly known as:  NEURONTIN Take 300 mg by mouth at bedtime.   hydrALAZINE 100 MG tablet Commonly known as:  APRESOLINE Take 100 mg by mouth 3 (three) times daily.   labetalol 200 MG tablet Commonly known as:  NORMODYNE Take 400 mg by mouth 3 (three) times daily.   lisinopril 20 MG tablet Commonly known as:  PRINIVIL,ZESTRIL Take 40 mg by mouth daily.   neomycin-polymyxin-dexameth 0.1 % Oint Commonly known as:  MAXITROL Place 1 application into both eyes daily as needed (eye irritation).   pantoprazole 20 MG tablet Commonly known as:  PROTONIX Take 20 mg by mouth daily.   pioglitazone 15 MG tablet Commonly known as:  ACTOS Take 15 mg by mouth daily.   sitaGLIPtin 100 MG tablet Commonly known as:  JANUVIA Take 100 mg by mouth daily.        Significant Diagnostic Studies:  No results found.  Brief H and P: For complete details please refer to admission H and P, but in brief pt is admitted for surgical mgmt of BPH  Hospital Course: Pt had uncomplicated postop course after TURP. HE voided w/o dificulty and was d/c ed on POD 1 Active Problems:   Urinary retention due to benign  prostatic hyperplasia   Day of Discharge BP 136/70 (BP Location: Right Arm)   Pulse (!) 58   Temp 98.1 F (36.7 C) (Oral)   Resp 18   Ht 5' 7"  (1.702 m)   Wt 83.9 kg (185 lb)   SpO2 96%   BMI 28.98 kg/m   Results for orders placed or performed during the hospital encounter of 09/08/17 (from the past 24 hour(s))  Glucose, capillary     Status: Abnormal   Collection Time: 09/08/17  9:24 AM  Result Value Ref Range   Glucose-Capillary 121 (H) 70 - 99 mg/dL  HIV antibody (Routine Testing)     Status: None   Collection Time: 09/08/17  9:49 AM  Result Value Ref Range   HIV Screen 4th Generation wRfx Non Reactive Non Reactive  Glucose, capillary     Status: Abnormal   Collection Time: 09/08/17 11:57 AM  Result Value Ref Range   Glucose-Capillary 133 (H) 70 - 99 mg/dL  Glucose, capillary     Status: Abnormal   Collection Time: 09/08/17  4:59 PM  Result Value Ref Range   Glucose-Capillary 284 (H) 70 - 99 mg/dL  Glucose, capillary     Status: Abnormal   Collection Time: 09/08/17  8:26 PM  Result Value Ref Range   Glucose-Capillary 240 (H) 70 - 99 mg/dL  Hemoglobin and hematocrit, blood     Status: Abnormal   Collection Time:  09/09/17  4:34 AM  Result Value Ref Range   Hemoglobin 9.5 (L) 13.0 - 17.0 g/dL   HCT 29.5 (L) 39.0 - 57.8 %  Basic metabolic panel     Status: Abnormal   Collection Time: 09/09/17  4:34 AM  Result Value Ref Range   Sodium 138 135 - 145 mmol/L   Potassium 4.4 3.5 - 5.1 mmol/L   Chloride 113 (H) 98 - 111 mmol/L   CO2 18 (L) 22 - 32 mmol/L   Glucose, Bld 164 (H) 70 - 99 mg/dL   BUN 27 (H) 8 - 23 mg/dL   Creatinine, Ser 1.62 (H) 0.61 - 1.24 mg/dL   Calcium 8.7 (L) 8.9 - 10.3 mg/dL   GFR calc non Af Amer 44 (L) >60 mL/min   GFR calc Af Amer 51 (L) >60 mL/min   Anion gap 7 5 - 15    Physical Exam: General: Alert and awake oriented x3 not in any acute distress. HEENT: anicteric sclera, pupils reactive to light and accommodation CVS: S1-S2 clear no  murmur rubs or gallops Chest: clear to auscultation bilaterally, no wheezing rales or rhonchi Abdomen: soft nontender, nondistended, normal bowel sounds, no organomegaly Extremities: no cyanosis, clubbing or edema noted bilaterally Neuro: Cranial nerves II-XII intact, no focal neurological deficits  Disposition:  Home  Diet:  Regular  Activity:  Discussed w/ pt   Disposition and Follow-up:    Home--f/u 7/11  TESTS THAT NEED FOLLOW-UP  Path review  DISCHARGE FOLLOW-UP Follow-up Information    Karen Kays, NP Follow up on 09/22/2017.   Specialty:  Nurse Practitioner Why:  [address] information: [address] [place] [address] [phone]           Time spent on Discharge:  10 mins  Signed: Lillette Boxer Ryelan Kazee 09/09/2017, 7:50 AM

## 2017-09-16 DIAGNOSIS — R17 Unspecified jaundice: Secondary | ICD-10-CM | POA: Diagnosis not present

## 2017-09-20 ENCOUNTER — Encounter (HOSPITAL_COMMUNITY): Payer: Self-pay | Admitting: *Deleted

## 2017-09-20 ENCOUNTER — Inpatient Hospital Stay (HOSPITAL_COMMUNITY)
Admission: EM | Admit: 2017-09-20 | Discharge: 2017-09-26 | DRG: 445 | Disposition: A | Discharge status: Home / Self Care | Payer: BLUE CROSS/BLUE SHIELD | Attending: Family Medicine | Admitting: Family Medicine

## 2017-09-20 ENCOUNTER — Emergency Department (HOSPITAL_COMMUNITY): Payer: BLUE CROSS/BLUE SHIELD

## 2017-09-20 ENCOUNTER — Inpatient Hospital Stay (HOSPITAL_COMMUNITY): Payer: BLUE CROSS/BLUE SHIELD

## 2017-09-20 ENCOUNTER — Other Ambulatory Visit: Payer: Self-pay

## 2017-09-20 DIAGNOSIS — Z833 Family history of diabetes mellitus: Secondary | ICD-10-CM

## 2017-09-20 DIAGNOSIS — K828 Other specified diseases of gallbladder: Secondary | ICD-10-CM | POA: Diagnosis present

## 2017-09-20 DIAGNOSIS — K7689 Other specified diseases of liver: Secondary | ICD-10-CM | POA: Diagnosis not present

## 2017-09-20 DIAGNOSIS — E1122 Type 2 diabetes mellitus with diabetic chronic kidney disease: Secondary | ICD-10-CM | POA: Diagnosis not present

## 2017-09-20 DIAGNOSIS — N401 Enlarged prostate with lower urinary tract symptoms: Secondary | ICD-10-CM | POA: Diagnosis not present

## 2017-09-20 DIAGNOSIS — Z885 Allergy status to narcotic agent status: Secondary | ICD-10-CM | POA: Diagnosis not present

## 2017-09-20 DIAGNOSIS — I10 Essential (primary) hypertension: Secondary | ICD-10-CM | POA: Diagnosis present

## 2017-09-20 DIAGNOSIS — K769 Liver disease, unspecified: Secondary | ICD-10-CM | POA: Diagnosis present

## 2017-09-20 DIAGNOSIS — R17 Unspecified jaundice: Secondary | ICD-10-CM | POA: Diagnosis not present

## 2017-09-20 DIAGNOSIS — Z85528 Personal history of other malignant neoplasm of kidney: Secondary | ICD-10-CM

## 2017-09-20 DIAGNOSIS — Z79899 Other long term (current) drug therapy: Secondary | ICD-10-CM | POA: Diagnosis not present

## 2017-09-20 DIAGNOSIS — E860 Dehydration: Secondary | ICD-10-CM | POA: Diagnosis not present

## 2017-09-20 DIAGNOSIS — N183 Chronic kidney disease, stage 3 unspecified: Secondary | ICD-10-CM | POA: Diagnosis present

## 2017-09-20 DIAGNOSIS — K219 Gastro-esophageal reflux disease without esophagitis: Secondary | ICD-10-CM | POA: Diagnosis not present

## 2017-09-20 DIAGNOSIS — Z91013 Allergy to seafood: Secondary | ICD-10-CM

## 2017-09-20 DIAGNOSIS — R1084 Generalized abdominal pain: Secondary | ICD-10-CM | POA: Diagnosis not present

## 2017-09-20 DIAGNOSIS — R160 Hepatomegaly, not elsewhere classified: Secondary | ICD-10-CM | POA: Diagnosis present

## 2017-09-20 DIAGNOSIS — K838 Other specified diseases of biliary tract: Secondary | ICD-10-CM

## 2017-09-20 DIAGNOSIS — N280 Ischemia and infarction of kidney: Secondary | ICD-10-CM | POA: Diagnosis present

## 2017-09-20 DIAGNOSIS — I129 Hypertensive chronic kidney disease with stage 1 through stage 4 chronic kidney disease, or unspecified chronic kidney disease: Secondary | ICD-10-CM | POA: Diagnosis not present

## 2017-09-20 DIAGNOSIS — K8050 Calculus of bile duct without cholangitis or cholecystitis without obstruction: Secondary | ICD-10-CM

## 2017-09-20 DIAGNOSIS — R932 Abnormal findings on diagnostic imaging of liver and biliary tract: Secondary | ICD-10-CM | POA: Diagnosis not present

## 2017-09-20 DIAGNOSIS — Z95828 Presence of other vascular implants and grafts: Secondary | ICD-10-CM | POA: Diagnosis not present

## 2017-09-20 DIAGNOSIS — K831 Obstruction of bile duct: Principal | ICD-10-CM | POA: Diagnosis present

## 2017-09-20 DIAGNOSIS — R935 Abnormal findings on diagnostic imaging of other abdominal regions, including retroperitoneum: Secondary | ICD-10-CM | POA: Diagnosis not present

## 2017-09-20 DIAGNOSIS — T85590A Other mechanical complication of bile duct prosthesis, initial encounter: Secondary | ICD-10-CM

## 2017-09-20 DIAGNOSIS — Z6827 Body mass index (BMI) 27.0-27.9, adult: Secondary | ICD-10-CM | POA: Diagnosis not present

## 2017-09-20 DIAGNOSIS — G4733 Obstructive sleep apnea (adult) (pediatric): Secondary | ICD-10-CM | POA: Diagnosis present

## 2017-09-20 DIAGNOSIS — E119 Type 2 diabetes mellitus without complications: Secondary | ICD-10-CM

## 2017-09-20 DIAGNOSIS — Z905 Acquired absence of kidney: Secondary | ICD-10-CM | POA: Diagnosis not present

## 2017-09-20 DIAGNOSIS — C787 Secondary malignant neoplasm of liver and intrahepatic bile duct: Secondary | ICD-10-CM | POA: Diagnosis not present

## 2017-09-20 DIAGNOSIS — R740 Nonspecific elevation of levels of transaminase and lactic acid dehydrogenase [LDH]: Secondary | ICD-10-CM | POA: Diagnosis not present

## 2017-09-20 DIAGNOSIS — E1142 Type 2 diabetes mellitus with diabetic polyneuropathy: Secondary | ICD-10-CM | POA: Diagnosis present

## 2017-09-20 DIAGNOSIS — K8020 Calculus of gallbladder without cholecystitis without obstruction: Secondary | ICD-10-CM | POA: Diagnosis not present

## 2017-09-20 DIAGNOSIS — R1111 Vomiting without nausea: Secondary | ICD-10-CM | POA: Diagnosis not present

## 2017-09-20 DIAGNOSIS — R8569 Abnormal cytological findings in specimens from other digestive organs and abdominal cavity: Secondary | ICD-10-CM | POA: Diagnosis not present

## 2017-09-20 DIAGNOSIS — K5900 Constipation, unspecified: Secondary | ICD-10-CM | POA: Diagnosis not present

## 2017-09-20 DIAGNOSIS — C801 Malignant (primary) neoplasm, unspecified: Secondary | ICD-10-CM | POA: Diagnosis not present

## 2017-09-20 DIAGNOSIS — R5383 Other fatigue: Secondary | ICD-10-CM | POA: Diagnosis not present

## 2017-09-20 DIAGNOSIS — R1010 Upper abdominal pain, unspecified: Secondary | ICD-10-CM | POA: Diagnosis not present

## 2017-09-20 DIAGNOSIS — R630 Anorexia: Secondary | ICD-10-CM | POA: Diagnosis not present

## 2017-09-20 DIAGNOSIS — D649 Anemia, unspecified: Secondary | ICD-10-CM | POA: Diagnosis not present

## 2017-09-20 DIAGNOSIS — N390 Urinary tract infection, site not specified: Secondary | ICD-10-CM | POA: Diagnosis present

## 2017-09-20 LAB — COMPREHENSIVE METABOLIC PANEL
ALBUMIN: 3.4 g/dL — AB (ref 3.5–5.0)
ALT: 155 U/L — ABNORMAL HIGH (ref 0–44)
ANION GAP: 12 (ref 5–15)
AST: 99 U/L — AB (ref 15–41)
Alkaline Phosphatase: 301 U/L — ABNORMAL HIGH (ref 38–126)
BILIRUBIN TOTAL: 6.3 mg/dL — AB (ref 0.3–1.2)
BUN: 14 mg/dL (ref 8–23)
CO2: 21 mmol/L — ABNORMAL LOW (ref 22–32)
Calcium: 9.2 mg/dL (ref 8.9–10.3)
Chloride: 105 mmol/L (ref 98–111)
Creatinine, Ser: 1.49 mg/dL — ABNORMAL HIGH (ref 0.61–1.24)
GFR calc Af Amer: 56 mL/min — ABNORMAL LOW (ref >60)
GFR, EST NON AFRICAN AMERICAN: 49 mL/min — AB (ref >60)
Glucose, Bld: 180 mg/dL — ABNORMAL HIGH (ref 70–99)
POTASSIUM: 4 mmol/L (ref 3.5–5.1)
Sodium: 138 mmol/L (ref 135–145)
TOTAL PROTEIN: 6.9 g/dL (ref 6.5–8.1)

## 2017-09-20 LAB — URINALYSIS, ROUTINE W REFLEX MICROSCOPIC
GLUCOSE, UA: NEGATIVE mg/dL
Ketones, ur: NEGATIVE mg/dL
NITRITE: NEGATIVE
PH: 5 (ref 5.0–8.0)
Protein, ur: 100 mg/dL — AB
RBC / HPF: >50 RBC/hpf — ABNORMAL HIGH (ref 0–5)
SPECIFIC GRAVITY, URINE: 1.026 (ref 1.005–1.030)

## 2017-09-20 LAB — CBC WITH DIFFERENTIAL/PLATELET
Abs Immature Granulocytes: 0.1 10*3/uL (ref 0.0–0.1)
BASOS PCT: 0 %
Basophils Absolute: 0 10*3/uL (ref 0.0–0.1)
EOS ABS: 0.2 10*3/uL (ref 0.0–0.7)
Eosinophils Relative: 2 %
HEMATOCRIT: 32.2 % — AB (ref 39.0–52.0)
Hemoglobin: 10.2 g/dL — ABNORMAL LOW (ref 13.0–17.0)
IMMATURE GRANULOCYTES: 1 %
LYMPHS ABS: 1.3 10*3/uL (ref 0.7–4.0)
Lymphocytes Relative: 12 %
MCH: 29.7 pg (ref 26.0–34.0)
MCHC: 31.7 g/dL (ref 30.0–36.0)
MCV: 93.9 fL (ref 78.0–100.0)
Monocytes Absolute: 0.8 10*3/uL (ref 0.1–1.0)
Monocytes Relative: 7 %
NEUTROS ABS: 8.4 10*3/uL — AB (ref 1.7–7.7)
NEUTROS PCT: 78 %
Platelets: 272 10*3/uL (ref 150–400)
RBC: 3.43 MIL/uL — AB (ref 4.22–5.81)
RDW: 16.5 % — AB (ref 11.5–15.5)
WBC: 10.8 10*3/uL — AB (ref 4.0–10.5)

## 2017-09-20 LAB — LIPASE, BLOOD: LIPASE: 22 U/L (ref 11–51)

## 2017-09-20 LAB — PROTIME-INR
INR: 1.11
PROTHROMBIN TIME: 14.2 s (ref 11.4–15.2)

## 2017-09-20 LAB — GLUCOSE, CAPILLARY
GLUCOSE-CAPILLARY: 115 mg/dL — AB (ref 70–99)
Glucose-Capillary: 110 mg/dL — ABNORMAL HIGH (ref 70–99)

## 2017-09-20 MED ORDER — PANTOPRAZOLE SODIUM 20 MG PO TBEC
20.0000 mg | DELAYED_RELEASE_TABLET | Freq: Every day | ORAL | Status: DC
  Administered 2017-09-21 – 2017-09-26 (×6): 20 mg via ORAL
  Filled 2017-09-20 (×6): qty 1

## 2017-09-20 MED ORDER — GABAPENTIN 300 MG PO CAPS
300.0000 mg | ORAL_CAPSULE | Freq: Every day | ORAL | Status: DC
  Administered 2017-09-20: 300 mg via ORAL
  Filled 2017-09-20: qty 1

## 2017-09-20 MED ORDER — SODIUM CHLORIDE 0.9 % IV SOLN
INTRAVENOUS | Status: DC
  Administered 2017-09-20 – 2017-09-23 (×7): via INTRAVENOUS

## 2017-09-20 MED ORDER — HYDRALAZINE HCL 50 MG PO TABS
100.0000 mg | ORAL_TABLET | Freq: Three times a day (TID) | ORAL | Status: DC
  Administered 2017-09-20 – 2017-09-26 (×16): 100 mg via ORAL
  Filled 2017-09-20 (×16): qty 2

## 2017-09-20 MED ORDER — ACETAMINOPHEN 325 MG PO TABS
650.0000 mg | ORAL_TABLET | Freq: Four times a day (QID) | ORAL | Status: DC | PRN
  Administered 2017-09-20 – 2017-09-25 (×2): 650 mg via ORAL
  Filled 2017-09-20 (×2): qty 2

## 2017-09-20 MED ORDER — INSULIN ASPART 100 UNIT/ML ~~LOC~~ SOLN
0.0000 [IU] | SUBCUTANEOUS | Status: DC
  Administered 2017-09-21: 5 [IU] via SUBCUTANEOUS
  Administered 2017-09-21: 7 [IU] via SUBCUTANEOUS
  Administered 2017-09-22: 1 [IU] via SUBCUTANEOUS
  Administered 2017-09-22: 2 [IU] via SUBCUTANEOUS
  Administered 2017-09-22 – 2017-09-23 (×3): 3 [IU] via SUBCUTANEOUS
  Administered 2017-09-24: 2 [IU] via SUBCUTANEOUS
  Administered 2017-09-24: 1 [IU] via SUBCUTANEOUS

## 2017-09-20 MED ORDER — ONDANSETRON HCL 4 MG PO TABS
4.0000 mg | ORAL_TABLET | Freq: Four times a day (QID) | ORAL | Status: DC | PRN
  Administered 2017-09-26: 4 mg via ORAL
  Filled 2017-09-20: qty 1

## 2017-09-20 MED ORDER — ONDANSETRON HCL 4 MG/2ML IJ SOLN: 4.0000 mg | Freq: Four times a day (QID) | INTRAMUSCULAR | Status: DC | PRN

## 2017-09-20 MED ORDER — LABETALOL HCL 200 MG PO TABS
400.0000 mg | ORAL_TABLET | Freq: Three times a day (TID) | ORAL | Status: DC
  Administered 2017-09-20 – 2017-09-26 (×15): 400 mg via ORAL
  Filled 2017-09-20 (×15): qty 2

## 2017-09-20 MED ORDER — METRONIDAZOLE IN NACL 5-0.79 MG/ML-% IV SOLN: 500.0000 mg | Freq: Three times a day (TID) | INTRAVENOUS | Status: DC

## 2017-09-20 MED ORDER — LISINOPRIL 40 MG PO TABS
40.0000 mg | ORAL_TABLET | Freq: Every day | ORAL | Status: DC
  Administered 2017-09-21 – 2017-09-25 (×5): 40 mg via ORAL
  Filled 2017-09-20 (×5): qty 1

## 2017-09-20 MED ORDER — ACETAMINOPHEN 650 MG RE SUPP: 650.0000 mg | Freq: Four times a day (QID) | RECTAL | Status: DC | PRN

## 2017-09-20 MED ORDER — HYDRALAZINE HCL 100 MG PO TABS: 100.0000 mg | ORAL_TABLET | Freq: Three times a day (TID) | ORAL | Status: DC

## 2017-09-20 MED ORDER — AMLODIPINE BESYLATE 10 MG PO TABS
10.0000 mg | ORAL_TABLET | Freq: Every day | ORAL | Status: DC
  Administered 2017-09-21 – 2017-09-25 (×5): 10 mg via ORAL
  Filled 2017-09-20 (×5): qty 1

## 2017-09-20 MED ORDER — SODIUM CHLORIDE 0.9 % IV SOLN: 1.0000 g | INTRAVENOUS | Status: DC

## 2017-09-20 NOTE — Progress Notes (Signed)
Called for report. ER RN not ready.

## 2017-09-20 NOTE — H&P (Signed)
History and Physical    Jerry Weiss DDU:[id number] DOB: May 07, 1955 DOA: 09/20/2017  PCP: [name], MD  Patient coming from: Home  I have personally briefly reviewed patient's old medical records in [place]  Chief Complaint: Jaundice  HPI: Jerry Weiss is a 62 y.o. male with medical history significant of DM2, HTN.  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  During that they discovered a R renal mass which ended up being RCC.  Underwent partial R nephrectomy in mid May, complicated by post op urinary retention.  Finally underwent TURP just 2 weeks ago.  Since then, over past couple of weeks he has had progressive nausea, abd discomfort, and has gradually become more jaundiced.  Presents to the ED today after lab work at PCPs office showed elevated bilirubin.  No fevers, diarrhea, has had white-colored stools.   ED Course: Bili 6.3.  ALK 301, AST 99, ALT 155, WBC 10.8k.  UA shows hematuria (expected post TURP).  Creat 1.5.  CT shows cholelithiasis, mild gallbladder distention and biliary duct dilatation.  Also 3 questionable new hypo densities in peripheral liver.   Review of Systems: As per HPI otherwise 10 point review of systems negative.   Past Medical History:  Diagnosis Date  . Anemia    mild  . 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 Diabetes mellitus without complication ([place])    type2  . GERD (gastroesophageal reflux disease)   . Hypertension   . Inguinal hernia    Bilateral age 14  . Leg muscle spasm    both legs occ  . OSA (obstructive sleep apnea)    Wears CPAP at night  . Pneumonia    history of x3  . Renal cancer, right ([place])   . 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 Renal mass, right   . Status post dilation of esophageal narrowing   . Umbilical hernia    Small noted on CT 05/26/2017  . Ventral  hernia    noted on CT 05/26/2017    Past Surgical History:  Procedure Laterality Date  . COLONOSCOPY    . HERNIA REPAIR Bilateral    AGE 42  . ROBOT ASSISTED LAPAROSCOPIC NEPHRECTOMY Right 07/28/2017   Procedure: XI ROBOTIC ASSISTED LAPAROSCOPIC PARTIAL NEPHRECTOMY;  Surgeon: Raynelle Bring, MD;  Location: [facility];  Service: Urology;  Laterality: Right;  . TONSILLECTOMY     age 12  . TRANSURETHRAL RESECTION OF PROSTATE    . TRANSURETHRAL RESECTION OF PROSTATE N/A 09/08/2017   Procedure: TRANSURETHRAL RESECTION OF THE PROSTATE (TURP);  Surgeon: Irine Seal, MD;  Location: [facility];  Service: Urology;  Laterality: N/A;  . UPPER GASTROINTESTINAL ENDOSCOPY       reports that he has never smoked. He has never used smokeless tobacco. He reports that he does not drink alcohol or use drugs.  Allergies  Allergen Reactions  . Codeine Other (See Comments)    Hallucinations   . Shellfish-Derived Products Swelling    "Eyes swell shut"    Family History  Problem Relation Age of Onset  . Allergies Mother   . Diabetes Mother   . Stroke Mother   . Skin cancer Mother   . Allergies Sister   . Asthma Daughter        childhood  . Asthma Father  developed a lot of medical problems after a fall  . Colon cancer Neg Hx   . Stomach cancer Neg Hx   . Rectal cancer Neg Hx   . Liver cancer Neg Hx   . Esophageal cancer Neg Hx      Prior to Admission medications   Medication Sig Start Date End Date Taking? Authorizing Provider  amLODipine (NORVASC) 10 MG tablet Take 10 mg by mouth daily.   Yes [provider]  gabapentin (NEURONTIN) 300 MG capsule Take 300 mg by mouth at bedtime.   Yes [provider]  hydrALAZINE (APRESOLINE) 100 MG tablet Take 100 mg by mouth 3 (three) times daily.   Yes [provider]  labetalol (NORMODYNE) 200 MG tablet Take 400 mg by mouth 3 (three) times daily.    Yes [provider]  lisinopril (PRINIVIL,ZESTRIL) 20 MG tablet Take 40  mg by mouth daily.    Yes [provider]  neomycin-polymyxin-dexameth (MAXITROL) 0.1 % OINT Place 1 application into both eyes daily as needed (eye irritation).   Yes [provider]  ondansetron (ZOFRAN) 4 MG tablet Take 4 mg by mouth 2 (two) times daily as needed for nausea/vomiting. 09/16/17  Yes [provider]  pantoprazole (PROTONIX) 20 MG tablet Take 20 mg by mouth daily.  07/11/16  Yes [provider]  pioglitazone (ACTOS) 15 MG tablet Take 15 mg by mouth daily.   Yes [provider]  sitaGLIPtin (JANUVIA) 100 MG tablet Take 100 mg by mouth daily.   Yes [provider]    Physical Exam: Vitals:   09/20/17 1643 09/20/17 1819 09/20/17 1930 09/20/17 1945  BP: 122/73 (!) 150/80 (!) 181/85 (!) 191/76  Pulse: 64 60 (!) 53 (!) 54  Resp: 18 16    Temp: 98.7 F (37.1 C) 98.5 F (36.9 C)    TempSrc: Oral Oral    SpO2: 98% 100% 100% 99%    Constitutional: NAD, calm, comfortable Eyes: PERRL, lids and conjunctivae normal ENMT: Mucous membranes are moist. Posterior pharynx clear of any exudate or lesions.Normal dentition.  Neck: normal, supple, no masses, no thyromegaly Respiratory: clear to auscultation bilaterally, no wheezing, no crackles. Normal respiratory effort. No accessory muscle use.  Cardiovascular: Regular rate and rhythm, no murmurs / rubs / gallops. No extremity edema. 2+ pedal pulses. No carotid bruits.  Abdomen: no tenderness, no masses palpated. No hepatosplenomegaly. Bowel sounds positive.  Musculoskeletal: no clubbing / cyanosis. No joint deformity upper and lower extremities. Good ROM, no contractures. Normal muscle tone.  Skin: no rashes, lesions, ulcers. No induration Neurologic: CN 2-12 grossly intact. Sensation intact, DTR normal. Strength 5/5 in all 4.  Psychiatric: Normal judgment and insight. Alert and oriented x 3. Normal mood.    Labs on Admission: I have personally reviewed following labs and imaging  studies  CBC: Recent Labs  Lab 09/20/17 1532  WBC 10.8*  NEUTROABS 8.4*  HGB 10.2*  HCT 32.2*  MCV 93.9  PLT 782   Basic Metabolic Panel: Recent Labs  Lab 09/20/17 1532  NA 138  K 4.0  CL 105  CO2 21*  GLUCOSE 180*  BUN 14  CREATININE 1.49*  CALCIUM 9.2   GFR: Estimated Creatinine Clearance: 53.2 mL/min (A) (by C-G formula based on SCr of 1.49 mg/dL (H)). Liver Function Tests: Recent Labs  Lab 09/20/17 1532  AST 99*  ALT 155*  ALKPHOS 301*  BILITOT 6.3*  PROT 6.9  ALBUMIN 3.4*   Recent Labs  Lab 09/20/17 1532  LIPASE 22   No results for input(s): AMMONIA in the last 168 hours. Coagulation Profile: Recent Labs  Lab 09/20/17 1532  INR 1.11   Cardiac Enzymes: No results for input(s): CKTOTAL, CKMB, CKMBINDEX, TROPONINI in the last 168 hours. BNP (last 3 results) No results for input(s): PROBNP in the last 8760 hours. HbA1C: No results for input(s): HGBA1C in the last 72 hours. CBG: No results for input(s): GLUCAP in the last 168 hours. Lipid Profile: No results for input(s): CHOL, HDL, LDLCALC, TRIG, CHOLHDL, LDLDIRECT in the last 72 hours. Thyroid Function Tests: No results for input(s): TSH, T4TOTAL, FREET4, T3FREE, THYROIDAB in the last 72 hours. Anemia Panel: No results for input(s): VITAMINB12, FOLATE, FERRITIN, TIBC, IRON, RETICCTPCT in the last 72 hours. Urine analysis:    Component Value Date/Time   COLORURINE AMBER (A) 09/20/2017 1639   APPEARANCEUR HAZY (A) 09/20/2017 1639   LABSPEC 1.026 09/20/2017 1639   PHURINE 5.0 09/20/2017 1639   GLUCOSEU NEGATIVE 09/20/2017 1639   HGBUR MODERATE (A) 09/20/2017 1639   BILIRUBINUR MODERATE (A) 09/20/2017 1639   KETONESUR NEGATIVE 09/20/2017 1639   PROTEINUR 100 (A) 09/20/2017 1639   NITRITE NEGATIVE 09/20/2017 1639   LEUKOCYTESUR SMALL (A) 09/20/2017 1639    Radiological Exams on Admission: Ct Abdomen Pelvis Wo Contrast  Result Date: 09/20/2017 CLINICAL DATA:  Fatigue with nausea and  vomiting. Recent TURP 2 weeks ago. History of renal cancer status post right partial nephrectomy. EXAM: CT ABDOMEN AND PELVIS WITHOUT CONTRAST TECHNIQUE: Multidetector CT imaging of the abdomen and pelvis was performed following the standard protocol without IV contrast. COMPARISON:  CT chest, abdomen, and pelvis dated May 25, 2017. FINDINGS: Lower chest: Linear scarring in the right lower lobe. Small pericardial effusion. Hepatobiliary: There are three questionable new hypodense lesions within the peripheral liver, measuring up to 2.9 cm. Unchanged cholelithiasis with new mild gallbladder distention. No gallbladder wall thickening. New common bile duct dilatation, measuring 1.4 cm in diameter, and mild central intrahepatic biliary dilatation. Pancreas: Unremarkable. No pancreatic ductal dilatation or surrounding inflammatory changes. Spleen: Normal in size without focal abnormality. Adrenals/Urinary Tract: The right adrenal gland is unremarkable. Unchanged 1.9 cm left adrenal adenoma. Unchanged infarcted appearance of the left kidney. Postsurgical changes related to interval right partial nephrectomy without definite focal lesion, although evaluation is limited without intravenous contrast. No renal or ureteral calculi. No hydronephrosis. The bladder is decompressed. Stomach/Bowel: Stomach is within normal limits. Appendix appears normal. No evidence of bowel wall thickening, distention, or inflammatory changes. Vascular/Lymphatic: Aortic atherosclerosis. No enlarged abdominal or pelvic lymph nodes. Reproductive: Mild prostatomegaly, unchanged. Other: No free fluid or pneumoperitoneum. Unchanged fat containing supraumbilical ventral hernia and umbilical hernia. Musculoskeletal: No acute or significant osseous findings. IMPRESSION: 1. Unchanged cholelithiasis with new mild gallbladder distention and intra- and extrahepatic biliary dilatation. No definite obstructing stone or lesion. Recommend correlation with LFTs  and either ERCP or MRCP for further evaluation. 2. Three questionable new hypodensities within the peripheral liver measuring up to 2.9 cm, incompletely evaluated without intravenous contrast. Recommend right upper quadrant ultrasound to confirm these findings prior to potential further evaluation with MRI. 3. Postsurgical changes related to interval right partial nephrectomy. Evaluation for residual or recurrent tumor is limited without intravenous contrast. 4. Unchanged left renal infarction. 5. Small pericardial effusion. 6.  Aortic atherosclerosis (ICD10-I70.0). Electronically Signed   By: Titus Dubin M.D.   On: 09/20/2017 16:33    EKG: Independently reviewed.  Assessment/Plan Principal Problem:   Obstructive jaundice Active Problems:   Hypodense  mass of liver   DM2 (diabetes mellitus, type 2) (HCC)   HTN (hypertension)   CKD (chronic kidney disease) stage 3, GFR 30-59 ml/min (HCC)    1. Obstructive Jaundice - 1. Dr. Cristina Gong to see in AM presumably for ERCP 2. Repeat CBC/CMP in AM 3. NPO after MN 4. IVF 2. Questionable hypodense mass of liver - 1. Obtaining the recommended [place] as next step 3. DM2 - 1. Hold home PO hypoglycemics 2. Sensitive SSI Q4H 4. HTN - continue home BP meds 5. CKD stage 3 - chronic and stable 6. OSA - declines CPAP in hospital  DVT prophylaxis: SCDs Code Status: Full Family Communication: No family in room Disposition Plan: Home after admit Consults called: Dr. Cristina Gong Admission status: Admit to inpatient   Etta Quill DO [facility] Pager [phone] Only works nights!  If 7AM-7PM, please contact the primary day team physician taking care of patient  [email or web address] Password [facility]  09/20/2017, 8:38 PM

## 2017-09-20 NOTE — ED Provider Notes (Signed)
[facility] Provider Note   CSN: [id number] Arrival date & time: 09/20/17  1508     History   Chief Complaint Chief Complaint  Patient presents with  . Abnormal Lab  . Fatigue    HPI Jerry Weiss is a 62 y.o. male.  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  As part of that work-up he was noted to have a mass on his right kidney when he returned home after 3 weeks in the inpatient and rehab services in [place] he pursued follow-up with Dr. Alinda Money of the urology service, was found to have a cancerous lesion which was resected.  Postoperatively the patient had urinary retention, he required intermittent Foley catheter placement and then ultimately had a transurethral resection of the prostate.  This all went well however over the last couple of weeks he has had some progressive nausea, abdominal discomfort and has gradually become more jaundiced.  He is not known to have any other abdominal cancers.  He was seen at the internal medicine offices at his family doctor, Dr. Harrington Challenger on Friday during which time lab work showed that he had a high bilirubin, he comes in today for further evaluation of this high bilirubin and his poor general feeling of nausea fatigue and abdominal discomfort over the last 2 weeks.  He denies fevers, diarrhea (has noted white-colored stools), swelling of the legs.  Past Medical History:  Diagnosis Date  . Anemia    mild  . 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 Diabetes mellitus without complication ([place])    type2  . GERD (gastroesophageal reflux disease)   . Hypertension   . Inguinal hernia    Bilateral age 29  . Leg  muscle spasm    both legs occ  . OSA (obstructive sleep apnea)    Wears CPAP at night  . Pneumonia    history of x3  . Renal cancer, right ([place])   . 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 Renal mass, right   . Status post dilation of esophageal narrowing   . Umbilical hernia    Small noted on CT 05/26/2017  . Ventral hernia    noted on CT 05/26/2017    Patient Active Problem List   Diagnosis Date Noted  . Obstructive jaundice 09/20/2017  . Hypodense mass of liver 09/20/2017  . DM2 (diabetes mellitus, type 2) ([place]) 09/20/2017  . HTN (hypertension) 09/20/2017  . Urinary retention due to benign prostatic hyperplasia 09/08/2017  . Renal neoplasm 07/28/2017  . OSA (obstructive sleep apnea) 07/15/2010    Past Surgical History:  Procedure Laterality Date  . COLONOSCOPY    . HERNIA REPAIR Bilateral    AGE 23  . ROBOT ASSISTED LAPAROSCOPIC NEPHRECTOMY Right 07/28/2017   Procedure: XI ROBOTIC ASSISTED LAPAROSCOPIC PARTIAL NEPHRECTOMY;  Surgeon: Raynelle Bring, MD;  Location: [facility];  Service: Urology;  Laterality: Right;  . TONSILLECTOMY     age 76  . TRANSURETHRAL RESECTION OF PROSTATE    . TRANSURETHRAL RESECTION OF PROSTATE N/A 09/08/2017   Procedure: TRANSURETHRAL RESECTION OF THE PROSTATE (TURP);  Surgeon: Irine Seal, MD;  Location: [facility];  Service:  Urology;  Laterality: N/A;  . UPPER GASTROINTESTINAL ENDOSCOPY          Home Medications    Prior to Admission medications   Medication Sig Start Date End Date Taking? Authorizing Provider  amLODipine (NORVASC) 10 MG tablet Take 10 mg by mouth daily.    [provider]  gabapentin (NEURONTIN) 300 MG capsule Take 300 mg by mouth at bedtime.    [provider]  hydrALAZINE (APRESOLINE) 100 MG tablet Take 100 mg by mouth 3 (three) times daily.    [provider]  labetalol (NORMODYNE) 200 MG tablet Take 400 mg by mouth 3 (three) times daily.     [provider]  lisinopril  (PRINIVIL,ZESTRIL) 20 MG tablet Take 40 mg by mouth daily.     [provider]  neomycin-polymyxin-dexameth (MAXITROL) 0.1 % OINT Place 1 application into both eyes daily as needed (eye irritation).    [provider]  pantoprazole (PROTONIX) 20 MG tablet Take 20 mg by mouth daily.  07/11/16   [provider]  pioglitazone (ACTOS) 15 MG tablet Take 15 mg by mouth daily.    [provider]  sitaGLIPtin (JANUVIA) 100 MG tablet Take 100 mg by mouth daily.    [provider]    Family History Family History  Problem Relation Age of Onset  . Allergies Mother   . Diabetes Mother   . Stroke Mother   . Skin cancer Mother   . Allergies Sister   . Asthma Daughter        childhood  . Asthma Father        developed a lot of medical problems after a fall  . Colon cancer Neg Hx   . Stomach cancer Neg Hx   . Rectal cancer Neg Hx   . Liver cancer Neg Hx   . Esophageal cancer Neg Hx     Social History Social History   Tobacco Use  . Smoking status: Never Smoker  . Smokeless tobacco: Never Used  Substance Use Topics  . Alcohol use: No  . Drug use: No     Allergies   Codeine and Shellfish-derived products   Review of Systems Review of Systems  All other systems reviewed and are negative.    Physical Exam Updated Vital Signs BP (!) 150/80 (BP Location: Left Arm)   Pulse 60   Temp 98.5 F (36.9 C) (Oral)   Resp 16   SpO2 100%   Physical Exam  Constitutional: He appears well-developed and well-nourished. No distress.  HENT:  Head: Normocephalic and atraumatic.  Mouth/Throat: Oropharynx is clear and moist. No oropharyngeal exudate.  Eyes: Pupils are equal, round, and reactive to light. EOM are normal. Right eye exhibits no discharge. Left eye exhibits no discharge. Scleral icterus is present.  Neck: Normal range of motion. Neck supple. No JVD present. No thyromegaly present.  Cardiovascular: Normal rate, regular rhythm, normal heart  sounds and intact distal pulses. Exam reveals no gallop and no friction rub.  No murmur heard. Pulmonary/Chest: Effort normal and breath sounds normal. No respiratory distress. He has no wheezes. He has no rales.  Abdominal: Soft. Bowel sounds are normal. He exhibits no distension and no mass. There is tenderness ( Minimal tenderness to the right upper quadrant and epigastrium, no guarding, no masses, healing surgical scars).  Musculoskeletal: Normal range of motion. He exhibits no edema or tenderness.  Lymphadenopathy:    He has no cervical adenopathy.  Neurological: He is alert. Coordination normal.  Normal gait, normal speech, normal strength in all 4 extremities  Skin: Skin is warm and dry. No rash noted. No erythema.  Jaundice  Psychiatric: He has a normal mood and affect. His behavior is normal.  Nursing note and vitals reviewed.    ED Treatments / Results  Labs (all labs ordered are listed, but only abnormal results are displayed) Labs Reviewed  CBC WITH DIFFERENTIAL/PLATELET - Abnormal; Notable for the following components:      Result Value   WBC 10.8 (*)    RBC 3.43 (*)    Hemoglobin 10.2 (*)    HCT 32.2 (*)    RDW 16.5 (*)    Neutro Abs 8.4 (*)    All other components within normal limits  COMPREHENSIVE METABOLIC PANEL - Abnormal; Notable for the following components:   CO2 21 (*)    Glucose, Bld 180 (*)    Creatinine, Ser 1.49 (*)    Albumin 3.4 (*)    AST 99 (*)    ALT 155 (*)    Alkaline Phosphatase 301 (*)    Total Bilirubin 6.3 (*)    GFR calc non Af Amer 49 (*)    GFR calc Af Amer 56 (*)    All other components within normal limits  URINALYSIS, ROUTINE W REFLEX MICROSCOPIC - Abnormal; Notable for the following components:   Color, Urine AMBER (*)    APPearance HAZY (*)    Hgb urine dipstick MODERATE (*)    Bilirubin Urine MODERATE (*)    Protein, ur 100 (*)    Leukocytes, UA SMALL (*)    RBC / HPF >50 (*)    Bacteria, UA FEW (*)    All other  components within normal limits  URINE CULTURE  PROTIME-INR  LIPASE, BLOOD  CBC  COMPREHENSIVE METABOLIC PANEL    EKG None  Radiology Ct Abdomen Pelvis Wo Contrast  Result Date: 09/20/2017 CLINICAL DATA:  Fatigue with nausea and vomiting. Recent TURP 2 weeks ago. History of renal cancer status post right partial nephrectomy. EXAM: CT ABDOMEN AND PELVIS WITHOUT CONTRAST TECHNIQUE: Multidetector CT imaging of the abdomen and pelvis was performed following the standard protocol without IV contrast. COMPARISON:  CT chest, abdomen, and pelvis dated May 25, 2017. FINDINGS: Lower chest: Linear scarring in the right lower lobe. Small pericardial effusion. Hepatobiliary: There are three questionable new hypodense lesions within the peripheral liver, measuring up to 2.9 cm. Unchanged cholelithiasis with new mild gallbladder distention. No gallbladder wall thickening. New common bile duct dilatation, measuring 1.4 cm in diameter, and mild central intrahepatic biliary dilatation. Pancreas: Unremarkable. No pancreatic ductal dilatation or surrounding inflammatory changes. Spleen: Normal in size without focal abnormality. Adrenals/Urinary Tract: The right adrenal gland is unremarkable. Unchanged 1.9 cm left adrenal adenoma. Unchanged infarcted appearance of the left kidney. Postsurgical changes related to interval right partial nephrectomy without definite focal lesion, although evaluation is limited without intravenous contrast. No renal or ureteral calculi. No hydronephrosis. The bladder is decompressed. Stomach/Bowel: Stomach is within normal limits. Appendix appears normal. No evidence of bowel wall thickening, distention, or inflammatory changes. Vascular/Lymphatic: Aortic atherosclerosis. No enlarged abdominal or pelvic lymph nodes. Reproductive: Mild prostatomegaly, unchanged. Other: No free fluid or pneumoperitoneum. Unchanged fat containing supraumbilical ventral hernia and umbilical hernia.  Musculoskeletal: No acute or significant osseous findings. IMPRESSION: 1. Unchanged cholelithiasis with new mild gallbladder distention and intra- and extrahepatic biliary dilatation. No definite obstructing stone or lesion. Recommend correlation with LFTs and either ERCP or MRCP for further evaluation. 2.  Three questionable new hypodensities within the peripheral liver measuring up to 2.9 cm, incompletely evaluated without intravenous contrast. Recommend right upper quadrant ultrasound to confirm these findings prior to potential further evaluation with MRI. 3. Postsurgical changes related to interval right partial nephrectomy. Evaluation for residual or recurrent tumor is limited without intravenous contrast. 4. Unchanged left renal infarction. 5. Small pericardial effusion. 6.  Aortic atherosclerosis (ICD10-I70.0). Electronically Signed   By: Titus Dubin M.D.   On: 09/20/2017 16:33    Procedures Procedures (including critical care time)  Medications Ordered in ED Medications  0.9 %  sodium chloride infusion (has no administration in time range)  acetaminophen (TYLENOL) tablet 650 mg (has no administration in time range)    Or  acetaminophen (TYLENOL) suppository 650 mg (has no administration in time range)  ondansetron (ZOFRAN) tablet 4 mg (has no administration in time range)    Or  ondansetron (ZOFRAN) injection 4 mg (has no administration in time range)  insulin aspart (novoLOG) injection 0-9 Units (has no administration in time range)     Initial Impression / Assessment and Plan / ED Course  I have reviewed the triage vital signs and the nursing notes.  Pertinent labs & imaging results that were available during my care of the patient were reviewed by me and considered in my medical decision making (see chart for details).  Clinical Course as of Sep 21 2018  Tue Sep 20, 2017  1911 Hemoglobin is baseline, creatinine is better than baseline, liver function test show alkaline  phosphatase of 300, total bilirubin of 6.3, ALT of 155 and AST of 99.  The patient is not a drinker.   [BM]    Clinical Course User Index [BM] Noemi Chapel, MD    Lab work shows that the patient has anemia hemoglobin 10.2, early [place] of over 6, LFTs are slightly elevated and the CT scan shows that he has a distended gallbladder with gallstones, there is no signs of obstructive biliary causes however it does show some sequela with dilated bile ducts.  The patient was informed of all of his results, he will be started on IV fluids and antiemetics as needed, he will need to be admitted to the hospital with gastroenterology consultation.  Care discussed with the gastroenterologist Dr. Cristina Gong as well as with the hospitalist Dr. Alcario Drought the latter of whom will admit.  Final Clinical Impressions(s) / ED Diagnoses   Final diagnoses:  Biliary obstruction  Dehydration    ED Discharge Orders    None       Noemi Chapel, MD 09/20/17 2020

## 2017-09-20 NOTE — ED Triage Notes (Signed)
Pt reports he had a TURP done about 1.5 weeks ago. Pt reports feeling fatigued, nauseated. Pt denies pain. Pt reports he was seen at PCP on Friday and had blood work done, elevated liver and low hemoglobin. Pt appears jaundiced.

## 2017-09-20 NOTE — ED Provider Notes (Signed)
Patient placed in Quick Look pathway, seen and evaluated   Chief Complaint: painless jaundice  HPI:   Recently diagnosed with kidney cancer after having aortic dissection several months ago.  Family noticed jaundice 1 week ago.  ROS: +fatigue, negative abd pain, no fever.   (one)  Physical Exam:   Gen: No distress  Neuro: Awake and Alert  Skin: Warm    Focused Exam: jaundice skin.  abd soft and non tender.   Initiation of care has begun. The patient has been counseled on the process, plan, and necessity for staying for the completion/evaluation, and the remainder of the medical screening examination    Domenic Moras, PA-C 09/20/17 [place], MD 09/21/17 1655

## 2017-09-20 NOTE — ED Notes (Signed)
Pt given specimen cup.

## 2017-09-21 ENCOUNTER — Inpatient Hospital Stay (HOSPITAL_COMMUNITY): Payer: BLUE CROSS/BLUE SHIELD | Admitting: Anesthesiology

## 2017-09-21 ENCOUNTER — Encounter (HOSPITAL_COMMUNITY)
Admission: EM | Disposition: A | Discharge status: Home / Self Care | Payer: Self-pay | Source: Home / Self Care | Attending: Internal Medicine

## 2017-09-21 ENCOUNTER — Inpatient Hospital Stay (HOSPITAL_COMMUNITY): Payer: BLUE CROSS/BLUE SHIELD

## 2017-09-21 ENCOUNTER — Encounter (HOSPITAL_COMMUNITY): Payer: Self-pay | Admitting: Gastroenterology

## 2017-09-21 HISTORY — PX: BRONCHIAL BRUSHINGS: SHX5108

## 2017-09-21 HISTORY — PX: BILIARY STENT PLACEMENT: SHX5538

## 2017-09-21 HISTORY — PX: ERCP: SHX5425

## 2017-09-21 HISTORY — PX: SPHINCTEROTOMY: SHX5544

## 2017-09-21 LAB — CBC
HEMATOCRIT: 29.8 % — AB (ref 39.0–52.0)
HEMOGLOBIN: 9.4 g/dL — AB (ref 13.0–17.0)
MCH: 29.5 pg (ref 26.0–34.0)
MCHC: 31.5 g/dL (ref 30.0–36.0)
MCV: 93.4 fL (ref 78.0–100.0)
Platelets: 247 10*3/uL (ref 150–400)
RBC: 3.19 MIL/uL — AB (ref 4.22–5.81)
RDW: 16.7 % — ABNORMAL HIGH (ref 11.5–15.5)
WBC: 10.6 10*3/uL — ABNORMAL HIGH (ref 4.0–10.5)

## 2017-09-21 LAB — COMPREHENSIVE METABOLIC PANEL
ALK PHOS: 277 U/L — AB (ref 38–126)
ALT: 136 U/L — AB (ref 0–44)
ANION GAP: 14 (ref 5–15)
AST: 84 U/L — ABNORMAL HIGH (ref 15–41)
Albumin: 3.1 g/dL — ABNORMAL LOW (ref 3.5–5.0)
BILIRUBIN TOTAL: 6.4 mg/dL — AB (ref 0.3–1.2)
BUN: 15 mg/dL (ref 8–23)
CALCIUM: 8.9 mg/dL (ref 8.9–10.3)
CO2: 21 mmol/L — AB (ref 22–32)
CREATININE: 1.5 mg/dL — AB (ref 0.61–1.24)
Chloride: 104 mmol/L (ref 98–111)
GFR, EST AFRICAN AMERICAN: 56 mL/min — AB (ref >60)
GFR, EST NON AFRICAN AMERICAN: 48 mL/min — AB (ref >60)
Glucose, Bld: 104 mg/dL — ABNORMAL HIGH (ref 70–99)
Potassium: 3.9 mmol/L (ref 3.5–5.1)
Sodium: 139 mmol/L (ref 135–145)
TOTAL PROTEIN: 6.1 g/dL — AB (ref 6.5–8.1)

## 2017-09-21 LAB — GLUCOSE, CAPILLARY
GLUCOSE-CAPILLARY: 90 mg/dL (ref 70–99)
GLUCOSE-CAPILLARY: 149 mg/dL — AB (ref 70–99)
Glucose-Capillary: 102 mg/dL — ABNORMAL HIGH (ref 70–99)
Glucose-Capillary: 299 mg/dL — ABNORMAL HIGH (ref 70–99)
Glucose-Capillary: 301 mg/dL — ABNORMAL HIGH (ref 70–99)

## 2017-09-21 SURGERY — ERCP, WITH INTERVENTION IF INDICATED
Anesthesia: General

## 2017-09-21 MED ORDER — LIDOCAINE HCL (CARDIAC) PF 100 MG/5ML IV SOSY
PREFILLED_SYRINGE | INTRAVENOUS | Status: DC | PRN
  Administered 2017-09-21: 60 mg via INTRAVENOUS

## 2017-09-21 MED ORDER — CIPROFLOXACIN IN D5W 400 MG/200ML IV SOLN
INTRAVENOUS | Status: DC | PRN
  Administered 2017-09-21: 400 mg via INTRAVENOUS

## 2017-09-21 MED ORDER — CIPROFLOXACIN IN D5W 400 MG/200ML IV SOLN
INTRAVENOUS | Status: AC
  Filled 2017-09-21: qty 200

## 2017-09-21 MED ORDER — DEXAMETHASONE SODIUM PHOSPHATE 10 MG/ML IJ SOLN
INTRAMUSCULAR | Status: DC | PRN
  Administered 2017-09-21: 10 mg via INTRAVENOUS

## 2017-09-21 MED ORDER — ROCURONIUM BROMIDE 100 MG/10ML IV SOLN
INTRAVENOUS | Status: DC | PRN
  Administered 2017-09-21: 40 mg via INTRAVENOUS

## 2017-09-21 MED ORDER — FENTANYL CITRATE (PF) 100 MCG/2ML IJ SOLN: 25.0000 ug | INTRAMUSCULAR | Status: DC | PRN

## 2017-09-21 MED ORDER — IOPAMIDOL (ISOVUE-300) INJECTION 61%
INTRAVENOUS | Status: AC
  Filled 2017-09-21: qty 50

## 2017-09-21 MED ORDER — INDOMETHACIN 50 MG RE SUPP
RECTAL | Status: AC
  Filled 2017-09-21: qty 2

## 2017-09-21 MED ORDER — IOPAMIDOL (ISOVUE-300) INJECTION 61%
INTRAVENOUS | Status: DC | PRN
  Administered 2017-09-21: 90 mL

## 2017-09-21 MED ORDER — INDOMETHACIN 50 MG RE SUPP
RECTAL | Status: DC | PRN
  Administered 2017-09-21: 100 mg via RECTAL

## 2017-09-21 MED ORDER — GABAPENTIN 300 MG PO CAPS
600.0000 mg | ORAL_CAPSULE | Freq: Every day | ORAL | Status: DC
Start: 2017-09-21 — End: 2017-09-26
  Administered 2017-09-21 – 2017-09-25 (×5): 600 mg via ORAL
  Filled 2017-09-21 (×5): qty 2

## 2017-09-21 MED ORDER — LACTATED RINGERS IV SOLN
INTRAVENOUS | Status: DC | PRN
  Administered 2017-09-21: 10:00:00 via INTRAVENOUS

## 2017-09-21 MED ORDER — SUGAMMADEX SODIUM 200 MG/2ML IV SOLN
INTRAVENOUS | Status: DC | PRN
  Administered 2017-09-21: 160 mg via INTRAVENOUS

## 2017-09-21 MED ORDER — EPHEDRINE SULFATE 50 MG/ML IJ SOLN
INTRAMUSCULAR | Status: DC | PRN
  Administered 2017-09-21 (×2): 5 mg via INTRAVENOUS
  Administered 2017-09-21: 10 mg via INTRAVENOUS

## 2017-09-21 MED ORDER — ONDANSETRON HCL 4 MG/2ML IJ SOLN: 4.0000 mg | Freq: Once | INTRAMUSCULAR | Status: DC | PRN

## 2017-09-21 MED ORDER — GLUCAGON HCL RDNA (DIAGNOSTIC) 1 MG IJ SOLR
INTRAMUSCULAR | Status: AC
Start: 2017-09-21 — End: ?
  Filled 2017-09-21: qty 1

## 2017-09-21 MED ORDER — FENTANYL CITRATE (PF) 100 MCG/2ML IJ SOLN
INTRAMUSCULAR | Status: DC | PRN
  Administered 2017-09-21: 100 ug via INTRAVENOUS

## 2017-09-21 MED ORDER — PHENYLEPHRINE HCL 10 MG/ML IJ SOLN
INTRAMUSCULAR | Status: DC | PRN
  Administered 2017-09-21: 40 ug via INTRAVENOUS
  Administered 2017-09-21: 80 ug via INTRAVENOUS

## 2017-09-21 MED ORDER — PROPOFOL 10 MG/ML IV BOLUS
INTRAVENOUS | Status: DC | PRN
  Administered 2017-09-21: 150 mg via INTRAVENOUS

## 2017-09-21 MED ORDER — SODIUM CHLORIDE 0.9 % IV SOLN: INTRAVENOUS | Status: DC

## 2017-09-21 NOTE — Progress Notes (Signed)
PROGRESS NOTE    Jerry Weiss  KHT:[id number] DOB: June 12, 1955 DOA: 09/20/2017 PCP: [name], MD   Brief Narrative:   48 male with past medical history of diabetes mellitus type 2, essential hypertension, recent type B aortic dissection, right renal mass status post nephrectomy, urinary retention requiring TURP about 2 weeks ago came to the hospital with complains of progressive abdominal discomfort, nausea and jaundice.  Patient's family member noted that he was getting more jaundiced on September 15, 2017 but failed to improve therefore went to urgent care clinic and was told his total bilirubin was elevated.  He was asked to follow-up outpatient therefore he follow with his primary care physician who recommended him getting admitted to the hospital for further work-up.  Assessment & Plan:   Principal Problem:   Obstructive jaundice Active Problems:   Hypodense mass of liver   DM2 (diabetes mellitus, type 2) (HCC)   HTN (hypertension)   CKD (chronic kidney disease) stage 3, GFR 30-59 ml/min (HCC)  Painless jaundice - Unknown exact etiology.  CT of the abdomen pelvis showed unchanged cholelithiasis with gallbladder, intra-and extrahepatic duct distention.  3 questionable hypodensities in liver, postsurgical changes from right partial nephrectomy.  -Right upper quadrant ultrasound confirms liver lesion and distended gallbladder without evidence of gallbladder wall thickening. -She has been consulted who plans on performing ERCP today.  Maintain n.p.o. for now, can resume diabetic diet thereafter. 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  Gentle hydration  as necessary  Obstructive sleep apnea -Does not want CPAP.  DVT prophylaxis: SCDs Code Status: Full code Family Communication: None at bedside Disposition Plan: Maintain inpatient stay for the next 48 hours  Consultants:   GI  Procedures:   ERCP Today  Antimicrobials:   None    Subjective: No complaints. Awaiting ercp.   Review of Systems Otherwise negative except as per HPI, including: General: Denies fever, chills, night sweats or unintended weight loss. Resp: Denies cough, wheezing, shortness of breath. Cardiac: Denies chest pain, palpitations, orthopnea, paroxysmal nocturnal dyspnea. GI: Denies abdominal pain,  vomiting, diarrhea or constipation GU: Denies dysuria, frequency, hesitancy or incontinence MS: Denies muscle aches, joint pain or swelling Neuro: Denies headache, neurologic deficits (focal weakness, numbness, tingling), abnormal gait Psych: Denies anxiety, depression, SI/HI/AVH Skin: Denies new rashes or lesions ID: Denies sick contacts, exotic exposures, travel  Objective: Vitals:   09/20/17 1945 09/20/17 2118 09/21/17 0358 09/21/17 0942  BP: (!) 191/76 (!) 189/85 (!) 153/74 (!) 193/78  Pulse: (!) 54 60 62 63  Resp:  20 18 17   Temp:  98 F (36.7 C) 98 F (36.7 C) 98.3 F (36.8 C)  TempSrc:  Oral Oral Oral  SpO2: 99% 98% 95% 97%  Weight:    79.4 kg (175 lb)  Height:    5' 7"  (1.702 m)    Intake/Output Summary (Last 24 hours) at 09/21/2017 1100 Last data filed at 09/21/2017 5320 Gross per 24 hour  Intake 1728.03 ml  Output 0 ml  Net 1728.03 ml   Filed Weights   09/21/17 0942  Weight: 79.4 kg (175 lb)    Examination:  General exam: Appears calm and comfortable; + Jaundiced skin, +scleral icterus.  Respiratory system: Clear to  auscultation. Respiratory effort normal. Cardiovascular system: S1 & S2 heard, RRR. No JVD, murmurs, rubs, gallops or clicks. No pedal edema. Gastrointestinal system: Abdomen is nondistended, soft and nontender. No  organomegaly or masses felt. Normal bowel sounds heard. Central nervous system: Alert and oriented. No focal neurological deficits. Extremities: Symmetric 5 x 5 power. Skin: No rashes, lesions or ulcers Psychiatry: Judgement and insight appear normal. Mood & affect appropriate.     Data Reviewed:   CBC: Recent Labs  Lab 09/20/17 1532 09/21/17 0352  WBC 10.8* 10.6*  NEUTROABS 8.4*  --   HGB 10.2* 9.4*  HCT 32.2* 29.8*  MCV 93.9 93.4  PLT 272 379   Basic Metabolic Panel: Recent Labs  Lab 09/20/17 1532 09/21/17 0352  NA 138 139  K 4.0 3.9  CL 105 104  CO2 21* 21*  GLUCOSE 180* 104*  BUN 14 15  CREATININE 1.49* 1.50*  CALCIUM 9.2 8.9   GFR: Estimated Creatinine Clearance: 51.6 mL/min (A) (by C-G formula based on SCr of 1.5 mg/dL (H)). Liver Function Tests: Recent Labs  Lab 09/20/17 1532 09/21/17 0352  AST 99* 84*  ALT 155* 136*  ALKPHOS 301* 277*  BILITOT 6.3* 6.4*  PROT 6.9 6.1*  ALBUMIN 3.4* 3.1*   Recent Labs  Lab 09/20/17 1532  LIPASE 22   No results for input(s): AMMONIA in the last 168 hours. Coagulation Profile: Recent Labs  Lab 09/20/17 1532  INR 1.11   Cardiac Enzymes: No results for input(s): CKTOTAL, CKMB, CKMBINDEX, TROPONINI in the last 168 hours. BNP (last 3 results) No results for input(s): PROBNP in the last 8760 hours. HbA1C: No results for input(s): HGBA1C in the last 72 hours. CBG: Recent Labs  Lab 09/20/17 2118 09/20/17 2354 09/21/17 0355 09/21/17 0737  GLUCAP 110* 115* 102* 90   Lipid Profile: No results for input(s): CHOL, HDL, LDLCALC, TRIG, CHOLHDL, LDLDIRECT in the last 72 hours. Thyroid Function Tests: No results for input(s): TSH, T4TOTAL, FREET4, T3FREE, THYROIDAB in the last 72 hours. Anemia Panel: No results for input(s): VITAMINB12, FOLATE, FERRITIN, TIBC, IRON, RETICCTPCT in the last 72 hours. Sepsis Labs: No results for input(s): PROCALCITON, LATICACIDVEN in the last 168 hours.  No results found for  this or any previous visit (from the past 240 hour(s)).       Radiology Studies: Ct Abdomen Pelvis Wo Contrast  Result Date: 09/20/2017 CLINICAL DATA:  Fatigue with nausea and vomiting. Recent TURP 2 weeks ago. History of renal cancer status post right partial nephrectomy. EXAM: CT ABDOMEN AND PELVIS WITHOUT CONTRAST TECHNIQUE: Multidetector CT imaging of the abdomen and pelvis was performed following the standard protocol without IV contrast. COMPARISON:  CT chest, abdomen, and pelvis dated May 25, 2017. FINDINGS: Lower chest: Linear scarring in the right lower lobe. Small pericardial effusion. Hepatobiliary: There are three questionable new hypodense lesions within the peripheral liver, measuring up to 2.9 cm. Unchanged cholelithiasis with new mild gallbladder distention. No gallbladder wall thickening. New common bile duct dilatation, measuring 1.4 cm in diameter, and mild central intrahepatic biliary dilatation. Pancreas: Unremarkable. No pancreatic ductal dilatation or surrounding inflammatory changes. Spleen: Normal in size without focal abnormality. Adrenals/Urinary Tract: The right adrenal gland is unremarkable. Unchanged 1.9 cm left adrenal adenoma. Unchanged infarcted appearance of the left kidney. Postsurgical changes related to interval right partial nephrectomy without definite focal lesion, although evaluation is limited without intravenous contrast. No renal or ureteral calculi. No hydronephrosis. The bladder is decompressed. Stomach/Bowel: Stomach is within normal limits. Appendix appears normal. No  evidence of bowel wall thickening, distention, or inflammatory changes. Vascular/Lymphatic: Aortic atherosclerosis. No enlarged abdominal or pelvic lymph nodes. Reproductive: Mild prostatomegaly, unchanged. Other: No free fluid or pneumoperitoneum. Unchanged fat containing supraumbilical ventral hernia and umbilical hernia. Musculoskeletal: No acute or significant osseous findings. IMPRESSION:  1. Unchanged cholelithiasis with new mild gallbladder distention and intra- and extrahepatic biliary dilatation. No definite obstructing stone or lesion. Recommend correlation with LFTs and either ERCP or MRCP for further evaluation. 2. Three questionable new hypodensities within the peripheral liver measuring up to 2.9 cm, incompletely evaluated without intravenous contrast. Recommend right upper quadrant ultrasound to confirm these findings prior to potential further evaluation with MRI. 3. Postsurgical changes related to interval right partial nephrectomy. Evaluation for residual or recurrent tumor is limited without intravenous contrast. 4. Unchanged left renal infarction. 5. Small pericardial effusion. 6.  Aortic atherosclerosis (ICD10-I70.0). Electronically Signed   By: Titus Dubin M.D.   On: 09/20/2017 16:33   US Abdomen Limited Ruq  Result Date: 09/21/2017 CLINICAL DATA:  Hypodense liver masses on CT.  Obstructive jaundice. EXAM: ULTRASOUND ABDOMEN LIMITED RIGHT UPPER QUADRANT COMPARISON:  Abdominopelvic CT yesterday, CT 05/25/2017. FINDINGS: Gallbladder: Gallbladder is distended containing intraluminal stones and sludge. No gallbladder wall thickening, normal gallbladder wall thickness of 2 mm. No pericholecystic fluid. No sonographic Murphy sign noted by sonographer. Common bile duct: Diameter: 14 mm at the porta hepatis. No visualized choledocholithiasis. Liver: Three peripheral hypoechoic liver lesions measuring 3.4 cm, 2.8 cm, and 1.8 cm in the left and right hepatic lobe. These correspond to findings on CT. Background parenchymal echogenicity is normal. Portal vein is patent on color Doppler imaging with normal direction of blood flow towards the liver. IMPRESSION: 1. Three peripheral hypoechoic liver lesions, largest measuring 3.4 cm. These have a nonspecific sonographic appearance, MRI would provide further characterization. Given these were not seen on prior exam, metastatic disease is  considered if there is history of primary malignancy. 2. Distended gallbladder with stones and sludge, no gallbladder wall thickening. Common bile duct dilatation without visualized choledocholithiasis. Electronically Signed   By: Jeb Levering M.D.   On: 09/21/2017 07:04        Scheduled Meds: . [MAR Hold] amLODipine  10 mg Oral QHS  . [MAR Hold] gabapentin  300 mg Oral QHS  . [MAR Hold] hydrALAZINE  100 mg Oral Q8H  . [MAR Hold] insulin aspart  0-9 Units Subcutaneous Q4H  . [MAR Hold] labetalol  400 mg Oral TID  . [MAR Hold] lisinopril  40 mg Oral QPC supper  . [MAR Hold] pantoprazole  20 mg Oral Daily   Continuous Infusions: . sodium chloride 125 mL/hr at 09/21/17 9357  . sodium chloride       LOS: 1 day    I have spent 35 minutes face to face with the patient and on the ward discussing the patients care, assessment, plan and disposition with other care givers. >50% of the time was devoted counseling the patient about the risks and benefits of treatment and coordinating care.     Ankit Arsenio Loader, MD [facility] Pager [phone]   If 7PM-7AM, please contact night-coverage [email or web address] Password [email or web address] 09/21/2017, 11:00 AM

## 2017-09-21 NOTE — Transfer of Care (Signed)
Immediate Anesthesia Transfer of Care Note  Patient: Jerry Weiss  Procedure(s) Performed: ENDOSCOPIC RETROGRADE CHOLANGIOPANCREATOGRAPHY (ERCP) (N/A )  Patient Location: Endoscopy Unit  Anesthesia Type:General  Level of Consciousness: awake, alert  and oriented  Airway & Oxygen Therapy: Patient Spontanous Breathing and Patient connected to nasal cannula oxygen  Post-op Assessment: Report given to RN, Post -op Vital signs reviewed and stable and Patient moving all extremities X 4  Post vital signs: Reviewed and stable  Last Vitals:  Vitals Value Taken Time  BP    Temp    Pulse    Resp    SpO2      Last Pain:  Vitals:   09/21/17 0942  TempSrc: Oral  PainSc: 0-No pain      Patients Stated Pain Goal: 0 (61/68/37 2902)  Complications: No apparent anesthesia complications

## 2017-09-21 NOTE — Progress Notes (Signed)
New Admission Note:  Arrival Method: Stretcher Mental Orientation: Alert and oriented x 4 Telemetry: N/A Assessment: Completed Skin: Warm and dry  IV: NSL  Pain: 6/10 abdomen  Tubes: N/A Safety Measures: Safety Fall Prevention Plan initiated.  Admission: Completed 5 M  Orientation: Patient has been orientated to the room, unit and the staff. Family: None  Orders have been reviewed and implemented. Will continue to monitor the patient. Call light has been placed within reach and bed alarm has been activated.   Sima Matas BSN, RN  Phone Number: [phone]

## 2017-09-21 NOTE — Op Note (Addendum)
[facility] Patient Name: Jerry Weiss Procedure Date : 09/21/2017 MRN: [id number] Attending MD: Clarene Essex , MD Date of Birth: 06/17/1955 CSN: [id number] Age: 62 Admit Type: Inpatient Procedure:                ERCP Indications:              Biliary dilation on Computed Tomogram Scan,                            Jaundice in patient with known gallstones Providers:                Clarene Essex, MD, Burtis Junes, RN, Laurena Spies,                            Technician, Otis Brace, MD Referring MD:              Medicines:                General Anesthesia Complications:            Unable to remove stent from migration inside duct Estimated Blood Loss:     Estimated blood loss was minimal which occurred                            with trauma to the ampulla and sphincterotomy site                            with above maneuvers. Procedure:                Pre-Anesthesia Assessment:                           - Prior to the procedure, a History and Physical                            was performed, and patient medications and                            allergies were reviewed. The patient's tolerance of                            previous anesthesia was also reviewed. The risks                            and benefits of the procedure and the sedation                            options and risks were discussed with the patient.                            All questions were answered, and informed consent                            was obtained. Prior Anticoagulants: The patient has  taken no previous anticoagulant or antiplatelet                            agents. ASA Grade Assessment: II - A patient with                            mild systemic disease. After reviewing the risks                            and benefits, the patient was deemed in                            satisfactory condition to undergo the procedure.                           After  obtaining informed consent, the scope was                            passed under direct vision. Throughout the                            procedure, the patient's blood pressure, pulse, and                            oxygen saturations were monitored continuously. The                            [id number] ([id number]) Pentax duodenoscope was                            introduced through the mouth, and used to inject                            contrast into and used to cannulate the bile duct.                            The ERCP was technically difficult and complex due                            to abnormal anatomy. Successful completion of the                            procedure was aided by performing the maneuvers                            documented (below) in this report. The patient                            tolerated the procedure well. Scope In: Scope Out: Findings:      the scope was advanced by my partner Dr. Jacinto Reap andThe major papilla was       normal. he had some difficulty cannulating so I took over the procedure       and deep selective cannulation was  readily obtained and there was no       pancreatic duct injection or wire advancement throughout the procedure       and we did see a smooth distal stricture which was short but no obvious       stones and I returned the scope to Dr. B who proceeded with theBiliary       sphincterotomy which was made with a Hydratome sphincterotome using ERBE       electrocautery. There was no post-sphincterotomy bleeding. we had       adequate biliary drainage and could get the fully bowed sphincterotome       easily in and out of the duct and we first went ahead and brushed the       distal duct in the customary fashion and then to better delineate the       stricture and to make sure there were no stones the biliary tree was       swept with a 9- 12 mm balloon starting at the bifurcation. the 9 mm       balloon passed readily through the patent  sphincterotomy site but the 12       mm balloon Hung up on the strictureand a little Sludge was swept from       the duct.we elected to place One 10 Fr by 4 cm covered metal stent was       placed 4 cm into the common bile duct. which unfortunately when released       migrated into the duct howeverBile flowed through the stent. and there       was adequate biliary drainage The stent was positioned too far upstream       (away from the lumen of the GI tract). we tried to retrieve the stent       first using the stone balloon by both inflating inside and above the       stent without success and then we tried the 10 mm by 4 French dilating       balloon with inflating and both inside and partially across the ampulla       but again were unsuccessful at removing the stent we then tried to grab       the end of the stent with the biopsy forceps and unfortunately at that       point we lost access with the wire and cannot grab the stent with the       biopsy forceps we then recannulated using the sphincterotome which had       some difficulty getting deep selective cannulation and passing the wire       through the stent but once finally accomplished we tried to place a 10       French 6 cm stent inside the existing stent but the stent could not       navigate the turn up the duct past the stent and we lost wire position       and although the retained wire position one time in retrying to advance       the stent the wire fell out again and since we had adequate biliary       drainage despite the stent being inside the duct elected to stop the       procedure at this point Impression:               - The major  papilla appeared normal. there was a                            smooth distal short stricture seen which was                            brushed for cytology                           - A biliary sphincterotomy was performed.                           - The biliary tree was swept and sludge  was found.                           - One covered metal stent was placed into the                            common bile duct. unfortunately it migrated into                            the mid duct and could not be removed as above and                            we could not place a second metal stent inside it                            and we stopped the procedure at that point Moderate Sedation:      moderate sedation-none Recommendation:           - Clear liquid diet today. if doing well this                            evening may have soft solids                           - Continue present medications.                           - Await cytology results. will need either MRI or                            liver biopsy if nondiagnostic                           - Telephone GI clinic if symptomatic PRN. might                            need repeat ERCP later this week to either we tried                            to place stent inside the stent or remove the stent  and then placed stent which probably be best served                            with using spyglass                           - Return to GI clinic at appointment to be                            scheduled. Procedure Code(s):        --- Professional ---                           [phone], Endoscopic retrograde                            cholangiopancreatography (ERCP); with placement of                            endoscopic stent into biliary or pancreatic duct,                            including pre- and post-dilation and guide wire                            passage, when performed, including sphincterotomy,                            when performed, each stent                           12162, Endoscopic retrograde                            cholangiopancreatography (ERCP); with removal of                            calculi/debris from biliary/pancreatic duct(s) Diagnosis Code(s):        ---  Professional ---                           R17, Unspecified jaundice                           K83.8, Other specified diseases of biliary tract CPT copyright 2017 [employer]. All rights reserved. The codes documented in this report are preliminary and upon coder review may  be revised to meet current compliance requirements. Clarene Essex, MD 09/21/2017 1:07:45 PM This report has been signed electronically. Otis Brace, MD Number of Addenda: 0

## 2017-09-21 NOTE — Progress Notes (Signed)
Patient stated he does not wear CPAP in the hospital.

## 2017-09-21 NOTE — Anesthesia Preprocedure Evaluation (Addendum)
Anesthesia Evaluation  Patient identified by MRN, date of birth, ID band Patient awake    Reviewed: Allergy & Precautions, NPO status , Patient's Chart, lab work & pertinent test results  Airway Mallampati: II  TM Distance: >3 FB Neck ROM: full    Dental  (+) Dental Advidsory Given, Teeth Intact   Pulmonary sleep apnea and Continuous Positive Airway Pressure Ventilation ,    Pulmonary exam normal        Cardiovascular hypertension, Pt. on medications and Pt. on home beta blockers Normal cardiovascular exam  ECG: NSR, rate 63 Type B dissection  Left renal infarct   Neuro/Psych negative neurological ROS  negative psych ROS   GI/Hepatic GERD  Medicated and Controlled,Jaundice   Endo/Other  diabetes, Insulin Dependent  Renal/GU Renal disease     Musculoskeletal negative musculoskeletal ROS (+)   Abdominal   Peds  Hematology  (+) anemia ,   Anesthesia Other Findings Stones Renal carcinoma  Reproductive/Obstetrics                         Anesthesia Physical Anesthesia Plan  ASA: III  Anesthesia Plan: General   Post-op Pain Management:    Induction: Intravenous  PONV Risk Score and Plan: 2 and Ondansetron, Dexamethasone and Treatment may vary due to age or medical condition  Airway Management Planned: Oral ETT  Additional Equipment:   Intra-op Plan:   Post-operative Plan: Extubation in OR  Informed Consent: I have reviewed the patients History and Physical, chart, labs and discussed the procedure including the risks, benefits and alternatives for the proposed anesthesia with the patient or authorized representative who has indicated his/her understanding and acceptance.   Dental advisory given and Dental Advisory Given  Plan Discussed with: CRNA  Anesthesia Plan Comments:        Anesthesia Quick Evaluation

## 2017-09-21 NOTE — Consult Note (Signed)
Reason for Consult:Obstructive jaundice Referring Physician: Hospital team  Jerry Weiss is an 62 y.o. male.  HPI: Weiss known to me from a colonoscopy 7 years ago as well as taking care of his Jerry who died of metastatic colon cancer and he has not been well since his stroke 2 weeks ago and he has had a difficult 7 month medical history with a aneurysm repair as well as a kidney cancer and has had multiple workups and his hospital computer chart was reviewed and he has noticed his skin being darker and his eyes being yellow in the last week and his urine has been dark since his TURP but he has not had any itching and is had some nausea and some vague lower abdominal pain but no vomiting or fever and he does know he has gallstones and he has no other complaints  Past Medical History:  Diagnosis Date  . Anemia    mild  . 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 GERD (gastroesophageal reflux disease)   . Hypertension   . Inguinal hernia    Bilateral age 71  . Leg muscle spasm    both legs occ  . OSA (obstructive sleep apnea)    Wears CPAP at night  . Pneumonia    history of x3  . Renal cancer, right ([place])   . 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 Renal mass, right   . Status post dilation of esophageal narrowing   . Umbilical hernia    Small noted on CT 05/26/2017  . Ventral hernia    noted on CT 05/26/2017    Past Surgical History:  Procedure Laterality Date  . COLONOSCOPY    . HERNIA REPAIR Bilateral    AGE 69  . ROBOT ASSISTED LAPAROSCOPIC NEPHRECTOMY Right 07/28/2017   Procedure: XI ROBOTIC ASSISTED LAPAROSCOPIC PARTIAL NEPHRECTOMY;  Surgeon: Raynelle Bring, MD;  Location: [facility];  Service: Urology;  Laterality: Right;  . TONSILLECTOMY     age 71  . TRANSURETHRAL RESECTION OF PROSTATE    . TRANSURETHRAL  RESECTION OF PROSTATE N/A 09/08/2017   Procedure: TRANSURETHRAL RESECTION OF THE PROSTATE (TURP);  Surgeon: Irine Seal, MD;  Location: [facility];  Service: Urology;  Laterality: N/A;  . UPPER GASTROINTESTINAL ENDOSCOPY      Family History  Problem Relation Age of Onset  . Allergies Mother   . Diabetes Mother   . Stroke Mother   . Skin cancer Mother   . Allergies Sister   . Asthma Daughter        childhood  . Asthma Father        developed a lot of medical problems after a fall  . Colon cancer Neg Hx   . Stomach cancer Neg Hx   . Rectal cancer Neg Hx   . Liver cancer Neg Hx   . Esophageal cancer Neg Hx     Social History:  reports that he has never smoked. He has never used smokeless tobacco. He reports that he does not drink alcohol or use drugs.  Allergies:  Allergies  Allergen Reactions  . Codeine Other (See Comments)    Hallucinations   . Shellfish-Derived Products Swelling    "Eyes swell shut"    Medications: I have reviewed the Weiss's current medications.  Results for orders placed or  performed during the hospital encounter of 09/20/17 (from the past 48 hour(s))  CBC with Differential     Status: Abnormal   Collection Time: 09/20/17  3:32 PM  Result Value Ref Range   WBC 10.8 (H) 4.0 - 10.5 K/uL   RBC 3.43 (L) 4.22 - 5.81 MIL/uL   Hemoglobin 10.2 (L) 13.0 - 17.0 g/dL   HCT 32.2 (L) 39.0 - 52.0 %   MCV 93.9 78.0 - 100.0 fL   MCH 29.7 26.0 - 34.0 pg   MCHC 31.7 30.0 - 36.0 g/dL   RDW 16.5 (H) 11.5 - 15.5 %   Platelets 272 150 - 400 K/uL   Neutrophils Relative % 78 %   Neutro Abs 8.4 (H) 1.7 - 7.7 K/uL   Lymphocytes Relative 12 %   Lymphs Abs 1.3 0.7 - 4.0 K/uL   Monocytes Relative 7 %   Monocytes Absolute 0.8 0.1 - 1.0 K/uL   Eosinophils Relative 2 %   Eosinophils Absolute 0.2 0.0 - 0.7 K/uL   Basophils Relative 0 %   Basophils Absolute 0.0 0.0 - 0.1 K/uL   Immature Granulocytes 1 %   Abs Immature Granulocytes 0.1 0.0 - 0.1 K/uL    Comment: Performed at  [facility], [address], [place], [place] [address]  Comprehensive metabolic panel     Status: Abnormal   Collection Time: 09/20/17  3:32 PM  Result Value Ref Range   Sodium 138 135 - 145 mmol/L   Potassium 4.0 3.5 - 5.1 mmol/L   Chloride 105 98 - 111 mmol/L    Comment: Please note change in reference range.   CO2 21 (L) 22 - 32 mmol/L   Glucose, Bld 180 (H) 70 - 99 mg/dL    Comment: Please note change in reference range.   BUN 14 8 - 23 mg/dL    Comment: Please note change in reference range.   Creatinine, Ser 1.49 (H) 0.61 - 1.24 mg/dL   Calcium 9.2 8.9 - 10.3 mg/dL   Total Protein 6.9 6.5 - 8.1 g/dL   Albumin 3.4 (L) 3.5 - 5.0 g/dL   AST 99 (H) 15 - 41 U/L   ALT 155 (H) 0 - 44 U/L    Comment: Please note change in reference range.   Alkaline Phosphatase 301 (H) 38 - 126 U/L   Total Bilirubin 6.3 (H) 0.3 - 1.2 mg/dL   GFR calc non Af Amer 49 (L) >60 mL/min   GFR calc Af Amer 56 (L) >60 mL/min    Comment: (NOTE) The eGFR has been calculated using the CKD EPI equation. This calculation has not been validated in all clinical situations. eGFR's persistently <60 mL/min signify possible Chronic Kidney Disease.    Anion gap 12 5 - 15    Comment: Performed at [facility] [address] [place], [place] [address]  Protime-INR     Status: None   Collection Time: 09/20/17  3:32 PM  Result Value Ref Range   Prothrombin Time 14.2 11.4 - 15.2 seconds   INR 1.11     Comment: Performed at [facility], [place] [address], [place], [place] [address]  Lipase, blood     Status: None   Collection Time: 09/20/17  3:32 PM  Result Value Ref Range   Lipase 22 11 - 51 U/L    Comment: Performed at [facility], [place] [address] [place], [place] [address]  Urinalysis, Routine w reflex microscopic  Status: Abnormal   Collection Time: 09/20/17  4:39 PM  Result Value Ref Range   Color, Urine AMBER (A) YELLOW    Comment: BIOCHEMICALS MAY BE AFFECTED BY COLOR    APPearance HAZY (A) CLEAR   Specific Gravity, Urine 1.026 1.005 - 1.030   pH 5.0 5.0 - 8.0   Glucose, UA NEGATIVE NEGATIVE mg/dL   Hgb urine dipstick MODERATE (A) NEGATIVE   Bilirubin Urine MODERATE (A) NEGATIVE   Ketones, ur NEGATIVE NEGATIVE mg/dL   Protein, ur 100 (A) NEGATIVE mg/dL   Nitrite NEGATIVE NEGATIVE   Leukocytes, UA SMALL (A) NEGATIVE   RBC / HPF >50 (H) 0 - 5 RBC/hpf   WBC, UA 21-50 0 - 5 WBC/hpf   Bacteria, UA FEW (A) NONE SEEN   Squamous Epithelial / LPF 0-5 0 - 5   Mucus PRESENT     Comment: Performed at [facility], [address] [place], [place] [address]  Glucose, capillary     Status: Abnormal   Collection Time: 09/20/17  9:18 PM  Result Value Ref Range   Glucose-Capillary 110 (H) 70 - 99 mg/dL  Glucose, capillary     Status: Abnormal   Collection Time: 09/20/17 11:54 PM  Result Value Ref Range   Glucose-Capillary 115 (H) 70 - 99 mg/dL  CBC     Status: Abnormal   Collection Time: 09/21/17  3:52 AM  Result Value Ref Range   WBC 10.6 (H) 4.0 - 10.5 K/uL   RBC 3.19 (L) 4.22 - 5.81 MIL/uL   Hemoglobin 9.4 (L) 13.0 - 17.0 g/dL   HCT 29.8 (L) 39.0 - 52.0 %   MCV 93.4 78.0 - 100.0 fL   MCH 29.5 26.0 - 34.0 pg   MCHC 31.5 30.0 - 36.0 g/dL   RDW 16.7 (H) 11.5 - 15.5 %   Platelets 247 150 - 400 K/uL    Comment: Performed at [facility], [place] [address] [place], [place] [address]  Comprehensive metabolic panel     Status: Abnormal   Collection Time: 09/21/17  3:52 AM  Result Value Ref Range   Sodium 139 135 - 145 mmol/L   Potassium 3.9 3.5 - 5.1 mmol/L   Chloride 104 98 - 111 mmol/L    Comment: Please note change in reference range.   CO2 21 (L) 22 - 32 mmol/L   Glucose, Bld 104 (H) 70 - 99 mg/dL    Comment: Please note change in reference range.   BUN 15 8 - 23 mg/dL    Comment: Please note change in reference range.   Creatinine, Ser 1.50 (H) 0.61 - 1.24 mg/dL   Calcium 8.9 8.9 - 10.3 mg/dL   Total Protein 6.1 (L) 6.5 - 8.1 g/dL    Albumin 3.1 (L) 3.5 - 5.0 g/dL   AST 84 (H) 15 - 41 U/L   ALT 136 (H) 0 - 44 U/L    Comment: Please note change in reference range.   Alkaline Phosphatase 277 (H) 38 - 126 U/L   Total Bilirubin 6.4 (H) 0.3 - 1.2 mg/dL   GFR calc non Af Amer 48 (L) >60 mL/min   GFR calc Af Amer 56 (L) >60 mL/min    Comment: (NOTE) The eGFR has been calculated using the CKD EPI equation. This calculation has not been validated in all clinical situations. eGFR's persistently <60 mL/min signify possible Chronic Kidney Disease.    Anion gap 14 5 - 15    Comment: Performed at  [facility], [place] [address], [place], [place] [address]  Glucose, capillary     Status: Abnormal   Collection Time: 09/21/17  3:55 AM  Result Value Ref Range   Glucose-Capillary 102 (H) 70 - 99 mg/dL  Glucose, capillary     Status: None   Collection Time: 09/21/17  7:37 AM  Result Value Ref Range   Glucose-Capillary 90 70 - 99 mg/dL    Ct Abdomen Pelvis Wo Contrast  Result Date: 09/20/2017 CLINICAL DATA:  Fatigue with nausea and vomiting. Recent TURP 2 weeks ago. History of renal cancer status post right partial nephrectomy. EXAM: CT ABDOMEN AND PELVIS WITHOUT CONTRAST TECHNIQUE: Multidetector CT imaging of the abdomen and pelvis was performed following the standard protocol without IV contrast. COMPARISON:  CT chest, abdomen, and pelvis dated May 25, 2017. FINDINGS: Lower chest: Linear scarring in the right lower lobe. Small pericardial effusion. Hepatobiliary: There are three questionable new hypodense lesions within the peripheral liver, measuring up to 2.9 cm. Unchanged cholelithiasis with new mild gallbladder distention. No gallbladder wall thickening. New common bile duct dilatation, measuring 1.4 cm in diameter, and mild central intrahepatic biliary dilatation. Pancreas: Unremarkable. No pancreatic ductal dilatation or surrounding inflammatory changes. Spleen: Normal in size without focal abnormality. Adrenals/Urinary Tract:  The right adrenal gland is unremarkable. Unchanged 1.9 cm left adrenal adenoma. Unchanged infarcted appearance of the left kidney. Postsurgical changes related to interval right partial nephrectomy without definite focal lesion, although evaluation is limited without intravenous contrast. No renal or ureteral calculi. No hydronephrosis. The bladder is decompressed. Stomach/Bowel: Stomach is within normal limits. Appendix appears normal. No evidence of bowel wall thickening, distention, or inflammatory changes. Vascular/Lymphatic: Aortic atherosclerosis. No enlarged abdominal or pelvic lymph nodes. Reproductive: Mild prostatomegaly, unchanged. Other: No free fluid or pneumoperitoneum. Unchanged fat containing supraumbilical ventral hernia and umbilical hernia. Musculoskeletal: No acute or significant osseous findings. IMPRESSION: 1. Unchanged cholelithiasis with new mild gallbladder distention and intra- and extrahepatic biliary dilatation. No definite obstructing stone or lesion. Recommend correlation with LFTs and either ERCP or MRCP for further evaluation. 2. Three questionable new hypodensities within the peripheral liver measuring up to 2.9 cm, incompletely evaluated without intravenous contrast. Recommend right upper quadrant ultrasound to confirm these findings prior to potential further evaluation with MRI. 3. Postsurgical changes related to interval right partial nephrectomy. Evaluation for residual or recurrent tumor is limited without intravenous contrast. 4. Unchanged left renal infarction. 5. Small pericardial effusion. 6.  Aortic atherosclerosis (ICD10-I70.0). Electronically Signed   By: Titus Dubin M.D.   On: 09/20/2017 16:33   US Abdomen Limited Ruq  Result Date: 09/21/2017 CLINICAL DATA:  Hypodense liver masses on CT.  Obstructive jaundice. EXAM: ULTRASOUND ABDOMEN LIMITED RIGHT UPPER QUADRANT COMPARISON:  Abdominopelvic CT yesterday, CT 05/25/2017. FINDINGS: Gallbladder: Gallbladder is  distended containing intraluminal stones and sludge. No gallbladder wall thickening, normal gallbladder wall thickness of 2 mm. No pericholecystic fluid. No sonographic Murphy sign noted by sonographer. Common bile duct: Diameter: 14 mm at the porta hepatis. No visualized choledocholithiasis. Liver: Three peripheral hypoechoic liver lesions measuring 3.4 cm, 2.8 cm, and 1.8 cm in the left and right hepatic lobe. These correspond to findings on CT. Background parenchymal echogenicity is normal. Portal vein is patent on color Doppler imaging with normal direction of blood flow towards the liver. IMPRESSION: 1. Three peripheral hypoechoic liver lesions, largest measuring 3.4 cm. These have a nonspecific sonographic appearance, MRI would provide further characterization. Given these were not seen on prior exam, metastatic disease  is considered if there is history of primary malignancy. 2. Distended gallbladder with stones and sludge, no gallbladder wall thickening. Common bile duct dilatation without visualized choledocholithiasis. Electronically Signed   By: Jeb Levering M.D.   On: 09/21/2017 07:04    ROSnegative except above Blood pressure (!) 153/74, pulse 62, temperature 98 F (36.7 C), temperature source Oral, resp. rate 18, SpO2 95 %. Physical Examvital signs stable afebrile no acute distress exam please see preassessment evaluation labs and ultrasound CT and previous MRI reviewed  Assessment/Plan: Obstructive jaundice in Weiss with 3 liver masses as well and gallstones Plan: The risks benefits methods of ERCP was discussed with the Weiss including sphincterotomy and possible stenting and will proceed this morning with further workup and plans pending those findings  [place] E 09/21/2017, 9:41 AM

## 2017-09-21 NOTE — Anesthesia Procedure Notes (Signed)
Procedure Name: Intubation Date/Time: 09/21/2017 10:38 AM Performed by: Neldon Newport, CRNA Pre-anesthesia Checklist: Timeout performed, Patient being monitored, Suction available, Emergency Drugs available and Patient identified Patient Re-evaluated:Patient Re-evaluated prior to induction Oxygen Delivery Method: Circle system utilized Preoxygenation: Pre-oxygenation with 100% oxygen Induction Type: IV induction Ventilation: Mask ventilation without difficulty Laryngoscope Size: McGraph and 4 Grade View: Grade I Tube type: Oral Tube size: 7.5 mm Number of attempts: 1 Placement Confirmation: breath sounds checked- equal and bilateral,  positive ETCO2 and ETT inserted through vocal cords under direct vision Secured at: 22 cm Tube secured with: Tape Dental Injury: Teeth and Oropharynx as per pre-operative assessment

## 2017-09-21 NOTE — Anesthesia Postprocedure Evaluation (Signed)
Anesthesia Post Note  Patient: Jerry Weiss  Procedure(s) Performed: ENDOSCOPIC RETROGRADE CHOLANGIOPANCREATOGRAPHY (ERCP) (N/A ) SPHINCTEROTOMY BILIARY DILITATION (N/A ) BILIARY STENT PLACEMENT BIOPSY     Patient location during evaluation: PACU Anesthesia Type: General Level of consciousness: awake and alert Pain management: pain level controlled Vital Signs Assessment: post-procedure vital signs reviewed and stable Respiratory status: spontaneous breathing, nonlabored ventilation, respiratory function stable and patient connected to nasal cannula oxygen Cardiovascular status: blood pressure returned to baseline and stable Postop Assessment: no apparent nausea or vomiting Anesthetic complications: no    Last Vitals:  Vitals:   09/21/17 1301 09/21/17 1359  BP: (!) 203/84 (!) 166/81  Pulse:  66  Resp: 15 16  Temp: 36.9 C 36.9 C  SpO2: 96% 97%    Last Pain:  Vitals:   09/21/17 1359  TempSrc: Oral  PainSc:                  Jerry Weiss

## 2017-09-22 ENCOUNTER — Inpatient Hospital Stay (HOSPITAL_COMMUNITY): Payer: BLUE CROSS/BLUE SHIELD

## 2017-09-22 ENCOUNTER — Encounter (HOSPITAL_COMMUNITY): Payer: Self-pay | Admitting: Radiology

## 2017-09-22 DIAGNOSIS — C787 Secondary malignant neoplasm of liver and intrahepatic bile duct: Secondary | ICD-10-CM | POA: Diagnosis not present

## 2017-09-22 DIAGNOSIS — C801 Malignant (primary) neoplasm, unspecified: Secondary | ICD-10-CM | POA: Diagnosis not present

## 2017-09-22 LAB — COMPREHENSIVE METABOLIC PANEL
ALBUMIN: 3 g/dL — AB (ref 3.5–5.0)
ALK PHOS: 266 U/L — AB (ref 38–126)
ALT: 123 U/L — AB (ref 0–44)
AST: 60 U/L — AB (ref 15–41)
Anion gap: 10 (ref 5–15)
BUN: 16 mg/dL (ref 8–23)
CALCIUM: 9.1 mg/dL (ref 8.9–10.3)
CHLORIDE: 109 mmol/L (ref 98–111)
CO2: 22 mmol/L (ref 22–32)
CREATININE: 1.34 mg/dL — AB (ref 0.61–1.24)
GFR calc non Af Amer: 55 mL/min — ABNORMAL LOW (ref >60)
GLUCOSE: 114 mg/dL — AB (ref 70–99)
Potassium: 3.8 mmol/L (ref 3.5–5.1)
SODIUM: 141 mmol/L (ref 135–145)
Total Bilirubin: 4.1 mg/dL — ABNORMAL HIGH (ref 0.3–1.2)
Total Protein: 6.3 g/dL — ABNORMAL LOW (ref 6.5–8.1)

## 2017-09-22 LAB — GLUCOSE, CAPILLARY
GLUCOSE-CAPILLARY: 102 mg/dL — AB (ref 70–99)
GLUCOSE-CAPILLARY: 203 mg/dL — AB (ref 70–99)
Glucose-Capillary: 107 mg/dL — ABNORMAL HIGH (ref 70–99)
Glucose-Capillary: 111 mg/dL — ABNORMAL HIGH (ref 70–99)
Glucose-Capillary: 145 mg/dL — ABNORMAL HIGH (ref 70–99)
Glucose-Capillary: 182 mg/dL — ABNORMAL HIGH (ref 70–99)

## 2017-09-22 LAB — CBC
HCT: 29.3 % — ABNORMAL LOW (ref 39.0–52.0)
HEMOGLOBIN: 9.8 g/dL — AB (ref 13.0–17.0)
MCH: 30.9 pg (ref 26.0–34.0)
MCHC: 33.4 g/dL (ref 30.0–36.0)
MCV: 92.4 fL (ref 78.0–100.0)
Platelets: 258 10*3/uL (ref 150–400)
RBC: 3.17 MIL/uL — AB (ref 4.22–5.81)
RDW: 16.3 % — ABNORMAL HIGH (ref 11.5–15.5)
WBC: 11.7 10*3/uL — ABNORMAL HIGH (ref 4.0–10.5)

## 2017-09-22 LAB — MAGNESIUM: MAGNESIUM: 1.9 mg/dL (ref 1.7–2.4)

## 2017-09-22 MED ORDER — MIDAZOLAM HCL 2 MG/2ML IJ SOLN
INTRAMUSCULAR | Status: AC
  Administered 2017-09-22: 15:00:00
  Filled 2017-09-22: qty 4

## 2017-09-22 MED ORDER — GELATIN ABSORBABLE 12-7 MM EX MISC
CUTANEOUS | Status: AC
  Administered 2017-09-22: 15:00:00
  Filled 2017-09-22: qty 1

## 2017-09-22 MED ORDER — LIDOCAINE HCL (PF) 1 % IJ SOLN
INTRAMUSCULAR | Status: AC
  Filled 2017-09-22: qty 30

## 2017-09-22 MED ORDER — LIDOCAINE-EPINEPHRINE 1 %-1:100000 IJ SOLN
INTRAMUSCULAR | Status: AC
  Administered 2017-09-22: 15:00:00
  Filled 2017-09-22: qty 1

## 2017-09-22 MED ORDER — FENTANYL CITRATE (PF) 100 MCG/2ML IJ SOLN
INTRAMUSCULAR | Status: AC | PRN
  Administered 2017-09-22 (×2): 50 ug via INTRAVENOUS

## 2017-09-22 MED ORDER — OXYCODONE HCL 5 MG PO TABS
5.0000 mg | ORAL_TABLET | ORAL | Status: DC | PRN
  Administered 2017-09-22 – 2017-09-25 (×9): 5 mg via ORAL
  Filled 2017-09-22 (×9): qty 1

## 2017-09-22 MED ORDER — FENTANYL CITRATE (PF) 100 MCG/2ML IJ SOLN
INTRAMUSCULAR | Status: AC
  Administered 2017-09-22: 15:00:00
  Filled 2017-09-22: qty 4

## 2017-09-22 MED ORDER — MIDAZOLAM HCL 2 MG/2ML IJ SOLN
INTRAMUSCULAR | Status: AC | PRN
  Administered 2017-09-22: 1 mg via INTRAVENOUS
  Administered 2017-09-22: 0.5 mg via INTRAVENOUS

## 2017-09-22 NOTE — Progress Notes (Signed)
PROGRESS NOTE    Jerry Weiss  CNO:[id number] DOB: 1955/08/08 DOA: 09/20/2017 PCP: [name], MD   Brief Narrative:   72 male with past medical history of diabetes mellitus type 2, essential hypertension, recent type B aortic dissection, right renal mass status post nephrectomy, urinary retention requiring TURP about 2 weeks ago came to the hospital with complains of progressive abdominal discomfort, nausea and jaundice.  Patient's family member noted that he was getting more jaundiced on September 15, 2017 but failed to improve therefore went to urgent care clinic and was told his total bilirubin was elevated.  He was asked to follow-up outpatient therefore he follow with his primary care physician who recommended him getting admitted to the hospital for further work-up.  During the hospitalization he underwent ERCP on 09/21/2017 which showed normal major papilla, smooth still short stricture which was brushed for cytology, biliary sphincterotomy was performed and one covered metal stent was placed but unfortunately migrated to mid duct and second stent inside it cannot be placed.   Assessment & Plan:   Principal Problem:   Obstructive jaundice Active Problems:   Hypodense mass of liver   DM2 (diabetes mellitus, type 2) (HCC)   HTN (hypertension)   CKD (chronic kidney disease) stage 3, GFR 30-59 ml/min (HCC)  Painless jaundice, Improving.  - Unknown exact etiology.  CT of the abdomen pelvis showed unchanged cholelithiasis with gallbladder, intra-and extrahepatic duct distention.  3 questionable hypodensities in liver, postsurgical changes from right partial nephrectomy.  -Right upper quadrant ultrasound confirms liver lesion and distended gallbladder without evidence of gallbladder wall thickening. -Patient underwent ERCP on 09/21/2017 which showed small distal short stricture which was brushed and sent for cytology, biliary sphincterotomy was performed and one stent was placed but  unfortunately migrated up to mid duct therefore a second stent cannot be placed.  GI following. -Scheduled for liver biopsy today by Dr Luan Pulling. -If no GI procedures planned tomorrow we will discharge him tomorrrow morning.  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  Essential hypertension -Continue Norvasc 10 mg, lisinopril 40 mg, labetalol 400 mg 3 times daily  Chronic kidney disease stage III -Closely monitor renal function, avoid any nephrotoxic drugs.  Gentle hydration as necessary  Obstructive sleep apnea -Does not want CPAP.  DVT prophylaxis: SCDs Code Status: Full code Family Communication: None at bedside Disposition Plan: We will likely discharge in next 24-48 hours  Consultants:   GI  Procedures:  ERCP 7/10- normal major papilla, smooth still short stricture which was brushed for cytology, biliary sphincterotomy was performed and one covered metal stent was placed but unfortunately migrated to mid duct and second stent inside it cannot be placed.   Antimicrobials:   None    Subjective: No complaints this morning.  No acute events overnight.  Review of Systems Otherwise negative except as per HPI, including: General = no fevers, chills, dizziness, malaise, fatigue HEENT/EYES = negative for pain, redness, loss of vision, double vision, blurred vision, loss of hearing, sore throat, hoarseness, dysphagia Cardiovascular= negative for chest pain, palpitation, murmurs, lower extremity swelling Respiratory/lungs= negative for shortness of breath, cough, hemoptysis, wheezing, mucus production Gastrointestinal= negative for nausea, vomiting,, abdominal pain, melena, hematemesis Genitourinary= negative for Dysuria, Hematuria, Change in Urinary Frequency MSK = Negative for arthralgia, myalgias, Back Pain, Joint swelling  Neurology= Negative for headache, seizures,  numbness, tingling  Psychiatry= Negative for anxiety, depression, suicidal and homocidal ideation  Allergy/Immunology= Medication/Food allergy as listed  Skin= Negative for Rash, lesions, ulcers, itching   Objective: Vitals:   09/21/17 1706 09/21/17 2038 09/22/17 0422 09/22/17 0902  BP: (!) 142/71 (!) 160/81 (!) 184/88 139/62  Pulse: 73 68 71 64  Resp: 18 16 16 17   Temp: 98.2 F (36.8 C) 98.5 F (36.9 C) 98.4 F (36.9 C) 98 F (36.7 C)  TempSrc: Oral Oral Oral Oral  SpO2: 96% 99% 96% 96%  Weight:  79.3 kg (174 lb 13.2 oz)    Height:        Intake/Output Summary (Last 24 hours) at 09/22/2017 0940 Last data filed at 09/22/2017 0600 Gross per 24 hour  Intake 3274.41 ml  Output 285 ml  Net 2989.41 ml   Filed Weights   09/21/17 0942 09/21/17 2038  Weight: 79.4 kg (175 lb) 79.3 kg (174 lb 13.2 oz)    Examination:  Constitutional: NAD, calm, comfortable Eyes: PERRL, lids and conjunctivae normal; + scleral itcerus ENMT: Mucous membranes are moist. Posterior pharynx clear of any exudate or lesions.Normal dentition.  Neck: normal, supple, no masses, no thyromegaly Respiratory: clear to auscultation bilaterally, no wheezing, no crackles. Normal respiratory effort. No accessory muscle use.  Cardiovascular: Regular rate and rhythm, no murmurs / rubs / gallops. No extremity edema. 2+ pedal pulses. No carotid bruits.  Abdomen: no tenderness, no masses palpated. No hepatosplenomegaly. Bowel sounds positive.  Musculoskeletal: no clubbing / cyanosis. No joint deformity upper and lower extremities. Good ROM, no contractures. Normal muscle tone.  Skin: Jaundiced skin. Improved.  Neurologic: CN 2-12 grossly intact. Sensation intact, DTR normal. Strength 5/5 in all 4.  Psychiatric: Normal judgment and insight. Alert and oriented x 3. Normal mood.     Data Reviewed:   CBC: Recent Labs  Lab 09/20/17 1532 09/21/17 0352 09/22/17 0511  WBC 10.8* 10.6* 11.7*  NEUTROABS 8.4*  --   --     HGB 10.2* 9.4* 9.8*  HCT 32.2* 29.8* 29.3*  MCV 93.9 93.4 92.4  PLT 272 247 889   Basic Metabolic Panel: Recent Labs  Lab 09/20/17 1532 09/21/17 0352 09/22/17 0511  NA 138 139 141  K 4.0 3.9 3.8  CL 105 104 109  CO2 21* 21* 22  GLUCOSE 180* 104* 114*  BUN 14 15 16   CREATININE 1.49* 1.50* 1.34*  CALCIUM 9.2 8.9 9.1  MG  --   --  1.9   GFR: Estimated Creatinine Clearance: 53.4 mL/min (A) (by C-G formula based on SCr of 1.34 mg/dL (H)). Liver Function Tests: Recent Labs  Lab 09/20/17 1532 09/21/17 0352 09/22/17 0511  AST 99* 84* 60*  ALT 155* 136* 123*  ALKPHOS 301* 277* 266*  BILITOT 6.3* 6.4* 4.1*  PROT 6.9 6.1* 6.3*  ALBUMIN 3.4* 3.1* 3.0*   Recent Labs  Lab 09/20/17 1532  LIPASE 22   No results for input(s): AMMONIA in the last 168 hours. Coagulation Profile: Recent Labs  Lab 09/20/17 1532  INR 1.11   Cardiac Enzymes: No results for input(s): CKTOTAL, CKMB, CKMBINDEX, TROPONINI in the last 168 hours. BNP (last 3 results) No results for input(s): PROBNP in the last 8760 hours. HbA1C: No results for input(s): HGBA1C in the last 72 hours. CBG: Recent Labs  Lab 09/21/17 1644 09/21/17 2037 09/22/17 0018 09/22/17 0421 09/22/17 0734  GLUCAP 299* 301* 182* 102* 107*   Lipid Profile: No results for input(s): CHOL, HDL, LDLCALC, TRIG, CHOLHDL, LDLDIRECT in the last 72 hours. Thyroid Function Tests: No results for input(s): TSH, T4TOTAL, FREET4,  T3FREE, THYROIDAB in the last 72 hours. Anemia Panel: No results for input(s): VITAMINB12, FOLATE, FERRITIN, TIBC, IRON, RETICCTPCT in the last 72 hours. Sepsis Labs: No results for input(s): PROCALCITON, LATICACIDVEN in the last 168 hours.  No results found for this or any previous visit (from the past 240 hour(s)).       Radiology Studies: Ct Abdomen Pelvis Wo Contrast  Result Date: 09/20/2017 CLINICAL DATA:  Fatigue with nausea and vomiting. Recent TURP 2 weeks ago. History of renal cancer status  post right partial nephrectomy. EXAM: CT ABDOMEN AND PELVIS WITHOUT CONTRAST TECHNIQUE: Multidetector CT imaging of the abdomen and pelvis was performed following the standard protocol without IV contrast. COMPARISON:  CT chest, abdomen, and pelvis dated May 25, 2017. FINDINGS: Lower chest: Linear scarring in the right lower lobe. Small pericardial effusion. Hepatobiliary: There are three questionable new hypodense lesions within the peripheral liver, measuring up to 2.9 cm. Unchanged cholelithiasis with new mild gallbladder distention. No gallbladder wall thickening. New common bile duct dilatation, measuring 1.4 cm in diameter, and mild central intrahepatic biliary dilatation. Pancreas: Unremarkable. No pancreatic ductal dilatation or surrounding inflammatory changes. Spleen: Normal in size without focal abnormality. Adrenals/Urinary Tract: The right adrenal gland is unremarkable. Unchanged 1.9 cm left adrenal adenoma. Unchanged infarcted appearance of the left kidney. Postsurgical changes related to interval right partial nephrectomy without definite focal lesion, although evaluation is limited without intravenous contrast. No renal or ureteral calculi. No hydronephrosis. The bladder is decompressed. Stomach/Bowel: Stomach is within normal limits. Appendix appears normal. No evidence of bowel wall thickening, distention, or inflammatory changes. Vascular/Lymphatic: Aortic atherosclerosis. No enlarged abdominal or pelvic lymph nodes. Reproductive: Mild prostatomegaly, unchanged. Other: No free fluid or pneumoperitoneum. Unchanged fat containing supraumbilical ventral hernia and umbilical hernia. Musculoskeletal: No acute or significant osseous findings. IMPRESSION: 1. Unchanged cholelithiasis with new mild gallbladder distention and intra- and extrahepatic biliary dilatation. No definite obstructing stone or lesion. Recommend correlation with LFTs and either ERCP or MRCP for further evaluation. 2. Three  questionable new hypodensities within the peripheral liver measuring up to 2.9 cm, incompletely evaluated without intravenous contrast. Recommend right upper quadrant ultrasound to confirm these findings prior to potential further evaluation with MRI. 3. Postsurgical changes related to interval right partial nephrectomy. Evaluation for residual or recurrent tumor is limited without intravenous contrast. 4. Unchanged left renal infarction. 5. Small pericardial effusion. 6.  Aortic atherosclerosis (ICD10-I70.0). Electronically Signed   By: Titus Dubin M.D.   On: 09/20/2017 16:33   Dg Ercp Biliary & Pancreatic Ducts  Result Date: 09/21/2017 CLINICAL DATA:  Common bile duct stone. Attempted biliary stent placement. EXAM: ERCP TECHNIQUE: Multiple spot images obtained with the fluoroscopic device and submitted for interpretation post-procedure. FLUOROSCOPY TIME:  18 minutes, 17 seconds COMPARISON:  CT abdomen and pelvis - 09/21/2017; MRCP - 06/24/2017 FINDINGS: Two spot intraoperative fluoroscopic images of the right upper abdominal quadrant during ERCP are provided for review Initial image demonstrates an ERCP probe overlying the right upper abdominal quadrant with selective cannulation opacification of the common bile duct which appears mild to moderately dilated. There is insufflation of a balloon within distal aspect of the CBD. Subsequent image demonstrates presumed sweeping of the CBD and presumed biliary sphincterotomy. IMPRESSION: ERCP as above. These images were submitted for radiologic interpretation only. Please see the procedural report for the amount of contrast and the fluoroscopy time utilized. Electronically Signed   By: Sandi Mariscal M.D.   On: 09/21/2017 12:59   US Abdomen Limited Ruq  Result  Date: 09/21/2017 CLINICAL DATA:  Hypodense liver masses on CT.  Obstructive jaundice. EXAM: ULTRASOUND ABDOMEN LIMITED RIGHT UPPER QUADRANT COMPARISON:  Abdominopelvic CT yesterday, CT 05/25/2017. FINDINGS:  Gallbladder: Gallbladder is distended containing intraluminal stones and sludge. No gallbladder wall thickening, normal gallbladder wall thickness of 2 mm. No pericholecystic fluid. No sonographic Murphy sign noted by sonographer. Common bile duct: Diameter: 14 mm at the porta hepatis. No visualized choledocholithiasis. Liver: Three peripheral hypoechoic liver lesions measuring 3.4 cm, 2.8 cm, and 1.8 cm in the left and right hepatic lobe. These correspond to findings on CT. Background parenchymal echogenicity is normal. Portal vein is patent on color Doppler imaging with normal direction of blood flow towards the liver. IMPRESSION: 1. Three peripheral hypoechoic liver lesions, largest measuring 3.4 cm. These have a nonspecific sonographic appearance, MRI would provide further characterization. Given these were not seen on prior exam, metastatic disease is considered if there is history of primary malignancy. 2. Distended gallbladder with stones and sludge, no gallbladder wall thickening. Common bile duct dilatation without visualized choledocholithiasis. Electronically Signed   By: Jeb Levering M.D.   On: 09/21/2017 07:04        Scheduled Meds: . amLODipine  10 mg Oral QHS  . gabapentin  600 mg Oral QHS  . hydrALAZINE  100 mg Oral Q8H  . insulin aspart  0-9 Units Subcutaneous Q4H  . labetalol  400 mg Oral TID  . lisinopril  40 mg Oral QPC supper  . pantoprazole  20 mg Oral Daily   Continuous Infusions: . sodium chloride 125 mL/hr at 09/22/17 0528     LOS: 2 days    I have spent 30 minutes face to face with the patient and on the ward discussing the patients care, assessment, plan and disposition with other care givers. >50% of the time was devoted counseling the patient about the risks and benefits of treatment and coordinating care.     Terron Merfeld Arsenio Loader, MD [facility] Pager [phone]   If 7PM-7AM, please contact night-coverage [email or web address] Password [email or web address] 09/22/2017,  9:40 AM

## 2017-09-22 NOTE — Progress Notes (Signed)
Chief Complaint: Patient was seen in consultation today for biopsy of liver lesion at the request of Dr. Alessandra Bevels  Referring Physician(s): Dr. Alessandra Bevels  Supervising Physician: Sandi Mariscal  Patient Status: [name]  History of Present Illness: Jerry Weiss is a 62 y.o. male with obstructive jaundice and hepatic lesions. He underwent ERCP yesterday and his T Bili has improved. IR is asked to perform liver lesion biopsy to assist in the workup. Chart, imaging, meds, labs reviewed. Has been NPO but is hungry Is feeling better since procedure tomorrow. No family at bedside.   Past Medical History:  Diagnosis Date  . Anemia    mild  . 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 Diabetes mellitus without complication ([place])    type2  . GERD (gastroesophageal reflux disease)   . Hypertension   . Inguinal hernia    Bilateral age 10  . Leg muscle spasm    both legs occ  . OSA (obstructive sleep apnea)    Wears CPAP at night  . Pneumonia    history of x3  . Renal cancer, right ([place])   . 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 Renal mass, right   . Status post dilation of esophageal narrowing   . Umbilical hernia    Small noted on CT 05/26/2017  . Ventral hernia    noted on CT 05/26/2017    Past Surgical History:  Procedure Laterality Date  . BILIARY STENT PLACEMENT  09/21/2017   Procedure: BILIARY STENT PLACEMENT;  Surgeon: Clarene Essex, MD;  Location: [place];  Service: Endoscopy;;  . BRONCHIAL BRUSHINGS  09/21/2017   Procedure: BILIARY BRUSHINGS;  Surgeon: Clarene Essex, MD;  Location: [place];  Service: Endoscopy;;  . COLONOSCOPY    . ERCP N/A 09/21/2017   Procedure: ENDOSCOPIC RETROGRADE CHOLANGIOPANCREATOGRAPHY (ERCP);  Surgeon: Clarene Essex, MD;  Location: [place];  Service: Endoscopy;  Laterality: N/A;  . HERNIA REPAIR Bilateral    AGE 29    . ROBOT ASSISTED LAPAROSCOPIC NEPHRECTOMY Right 07/28/2017   Procedure: XI ROBOTIC ASSISTED LAPAROSCOPIC PARTIAL NEPHRECTOMY;  Surgeon: Raynelle Bring, MD;  Location: [facility];  Service: Urology;  Laterality: Right;  . SPHINCTEROTOMY  09/21/2017   Procedure: SPHINCTEROTOMY;  Surgeon: Clarene Essex, MD;  Location: [facility];  Service: Endoscopy;;  . TONSILLECTOMY     age 42  . TRANSURETHRAL RESECTION OF PROSTATE    . TRANSURETHRAL RESECTION OF PROSTATE N/A 09/08/2017   Procedure: TRANSURETHRAL RESECTION OF THE PROSTATE (TURP);  Surgeon: Irine Seal, MD;  Location: [facility];  Service: Urology;  Laterality: N/A;  . UPPER GASTROINTESTINAL ENDOSCOPY      Allergies: Codeine and Shellfish-derived products  Medications:  Current Facility-Administered Medications:  .  0.9 %  sodium chloride infusion, , Intravenous, Continuous, Clarene Essex, MD, Last Rate: 125 mL/hr at 09/22/17 0528 .  acetaminophen (TYLENOL) tablet 650 mg, 650 mg, Oral, Q6H PRN, 650 mg at 09/20/17 2133 **OR** acetaminophen (TYLENOL) suppository 650 mg, 650 mg, Rectal, Q6H PRN, Clarene Essex, MD .  amLODipine (NORVASC) tablet 10 mg, 10 mg, Oral, QHS, Clarene Essex, MD, 10 mg at 09/21/17 2112 .  fentaNYL (SUBLIMAZE) injection 25-50 mcg, 25-50 mcg, Intravenous, Q5 min PRN, Ellender, Karyl Kinnier, MD .  gabapentin (NEURONTIN) capsule 600 mg, 600 mg, Oral, QHS, Amin, Ankit Chirag, MD, 600 mg at 09/21/17 2112 .  hydrALAZINE (APRESOLINE) tablet 100 mg, 100  mg, Oral, Q8H, Clarene Essex, MD, 100 mg at 09/22/17 0617 .  insulin aspart (novoLOG) injection 0-9 Units, 0-9 Units, Subcutaneous, Q4H, Clarene Essex, MD, 2 Units at 09/22/17 0025 .  labetalol (NORMODYNE) tablet 400 mg, 400 mg, Oral, TID, Clarene Essex, MD, 400 mg at 09/21/17 2113 .  lisinopril (PRINIVIL,ZESTRIL) tablet 40 mg, 40 mg, Oral, QPC supper, Clarene Essex, MD, 40 mg at 09/21/17 1747 .  ondansetron (ZOFRAN) tablet 4 mg, 4 mg, Oral, Q6H PRN **OR** ondansetron (ZOFRAN) injection 4 mg, 4 mg,  Intravenous, Q6H PRN, Clarene Essex, MD .  ondansetron (ZOFRAN) injection 4 mg, 4 mg, Intravenous, Once PRN, Ellender, Karyl Kinnier, MD .  pantoprazole (PROTONIX) EC tablet 20 mg, 20 mg, Oral, Daily, Clarene Essex, MD, 20 mg at 09/21/17 1604    Family History  Problem Relation Age of Onset  . Allergies Mother   . Diabetes Mother   . Stroke Mother   . Skin cancer Mother   . Allergies Sister   . Asthma Daughter        childhood  . Asthma Father        developed a lot of medical problems after a fall  . Colon cancer Neg Hx   . Stomach cancer Neg Hx   . Rectal cancer Neg Hx   . Liver cancer Neg Hx   . Esophageal cancer Neg Hx     Social History   Socioeconomic History  . Marital status: Widowed    Spouse name: Not on file  . Number of children: 4  . Years of education: Not on file  . Highest education level: Not on file  Occupational History  . Occupation: Training and development officer  Social Needs  . Financial resource strain: Not on file  . Food insecurity:    Worry: Not on file    Inability: Not on file  . Transportation needs:    Medical: Not on file    Non-medical: Not on file  Tobacco Use  . Smoking status: Never Smoker  . Smokeless tobacco: Never Used  Substance and Sexual Activity  . Alcohol use: No  . Drug use: No  . Sexual activity: Yes  Lifestyle  . Physical activity:    Days per week: Not on file    Minutes per session: Not on file  . Stress: Not on file  Relationships  . Social connections:    Talks on phone: Not on file    Gets together: Not on file    Attends religious service: Not on file    Active member of club or organization: Not on file    Attends meetings of clubs or organizations: Not on file    Relationship status: Not on file  Other Topics Concern  . Not on file  Social History Narrative  . Not on file     Review of Systems: A 12 point ROS discussed and pertinent positives are indicated in the HPI above.  All other systems are negative.  Review of  Systems  Vital Signs: BP 139/62 (BP Location: Right Arm)   Pulse 64   Temp 98 F (36.7 C) (Oral)   Resp 17   Ht 5' 7"  (1.702 m)   Wt 174 lb 13.2 oz (79.3 kg)   SpO2 96%   BMI 27.38 kg/m   Physical Exam  Constitutional: He is oriented to person, place, and time. He appears well-developed. No distress.  HENT:  Head: Normocephalic.  Mouth/Throat: Oropharynx is clear and moist.  Neck: Normal range of  motion. No JVD present. No tracheal deviation present.  Cardiovascular: Normal rate, regular rhythm and normal heart sounds.  Pulmonary/Chest: Effort normal and breath sounds normal. No respiratory distress.  Abdominal: He exhibits no distension and no mass. There is no tenderness. There is no guarding.  Neurological: He is alert and oriented to person, place, and time.  Skin: Skin is warm and dry.  Obvious jaundice     Imaging: Ct Abdomen Pelvis Wo Contrast  Result Date: 09/20/2017 CLINICAL DATA:  Fatigue with nausea and vomiting. Recent TURP 2 weeks ago. History of renal cancer status post right partial nephrectomy. EXAM: CT ABDOMEN AND PELVIS WITHOUT CONTRAST TECHNIQUE: Multidetector CT imaging of the abdomen and pelvis was performed following the standard protocol without IV contrast. COMPARISON:  CT chest, abdomen, and pelvis dated May 25, 2017. FINDINGS: Lower chest: Linear scarring in the right lower lobe. Small pericardial effusion. Hepatobiliary: There are three questionable new hypodense lesions within the peripheral liver, measuring up to 2.9 cm. Unchanged cholelithiasis with new mild gallbladder distention. No gallbladder wall thickening. New common bile duct dilatation, measuring 1.4 cm in diameter, and mild central intrahepatic biliary dilatation. Pancreas: Unremarkable. No pancreatic ductal dilatation or surrounding inflammatory changes. Spleen: Normal in size without focal abnormality. Adrenals/Urinary Tract: The right adrenal gland is unremarkable. 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 Unchanged infarcted appearance of the left kidney. Postsurgical changes related to interval right partial nephrectomy without definite focal lesion, although evaluation is limited without intravenous contrast. No renal or ureteral calculi. No hydronephrosis. The bladder is decompressed. Stomach/Bowel: Stomach is within normal limits. Appendix appears normal. No evidence of bowel wall thickening, distention, or inflammatory changes. Vascular/Lymphatic: Aortic atherosclerosis. No enlarged abdominal or pelvic lymph nodes. Reproductive: Mild prostatomegaly, unchanged. Other: No free fluid or pneumoperitoneum. Unchanged fat containing supraumbilical ventral hernia and umbilical hernia. Musculoskeletal: No acute or significant osseous findings. IMPRESSION: 1. Unchanged cholelithiasis with new mild gallbladder distention and intra- and extrahepatic biliary dilatation. No definite obstructing stone or lesion. Recommend correlation with LFTs and either ERCP or MRCP for further evaluation. 2. Three questionable new hypodensities within the peripheral liver measuring up to 2.9 cm, incompletely evaluated without intravenous contrast. Recommend right upper quadrant ultrasound to confirm these findings prior to potential further evaluation with MRI. 3. Postsurgical changes related to interval right partial nephrectomy. Evaluation for residual or recurrent tumor is limited without intravenous contrast. 4. Unchanged left renal infarction. 5. Small pericardial effusion. 6.  Aortic atherosclerosis (ICD10-I70.0). Electronically Signed   By: Titus Dubin M.D.   On: 09/20/2017 16:33   Dg Ercp Biliary & Pancreatic Ducts  Result Date: 09/21/2017 CLINICAL DATA:  Common bile duct stone. Attempted biliary stent placement. EXAM: ERCP TECHNIQUE: Multiple spot images obtained with the fluoroscopic device and submitted for interpretation post-procedure. FLUOROSCOPY TIME:  18 minutes, 17 seconds COMPARISON:  CT abdomen and  pelvis - 09/21/2017; MRCP - 06/24/2017 FINDINGS: Two spot intraoperative fluoroscopic images of the right upper abdominal quadrant during ERCP are provided for review Initial image demonstrates an ERCP probe overlying the right upper abdominal quadrant with selective cannulation opacification of the common bile duct which appears mild to moderately dilated. There is insufflation of a balloon within distal aspect of the CBD. Subsequent image demonstrates presumed sweeping of the CBD and presumed biliary sphincterotomy. IMPRESSION: ERCP as above. These images were submitted for radiologic interpretation only. Please see the procedural report for the amount of contrast and the fluoroscopy time utilized. Electronically Signed   By: Eldridge Abrahams.D.  On: 09/21/2017 12:59   US Abdomen Limited Ruq  Result Date: 09/21/2017 CLINICAL DATA:  Hypodense liver masses on CT.  Obstructive jaundice. EXAM: ULTRASOUND ABDOMEN LIMITED RIGHT UPPER QUADRANT COMPARISON:  Abdominopelvic CT yesterday, CT 05/25/2017. FINDINGS: Gallbladder: Gallbladder is distended containing intraluminal stones and sludge. No gallbladder wall thickening, normal gallbladder wall thickness of 2 mm. No pericholecystic fluid. No sonographic Murphy sign noted by sonographer. Common bile duct: Diameter: 14 mm at the porta hepatis. No visualized choledocholithiasis. Liver: Three peripheral hypoechoic liver lesions measuring 3.4 cm, 2.8 cm, and 1.8 cm in the left and right hepatic lobe. These correspond to findings on CT. Background parenchymal echogenicity is normal. Portal vein is patent on color Doppler imaging with normal direction of blood flow towards the liver. IMPRESSION: 1. Three peripheral hypoechoic liver lesions, largest measuring 3.4 cm. These have a nonspecific sonographic appearance, MRI would provide further characterization. Given these were not seen on prior exam, metastatic disease is considered if there is history of primary malignancy. 2.  Distended gallbladder with stones and sludge, no gallbladder wall thickening. Common bile duct dilatation without visualized choledocholithiasis. Electronically Signed   By: Jeb Levering M.D.   On: 09/21/2017 07:04    Labs:  CBC: Recent Labs    09/05/17 1504 09/09/17 0434 09/20/17 1532 09/21/17 0352 09/22/17 0511  WBC 9.1  --  10.8* 10.6* 11.7*  HGB 9.7* 9.5* 10.2* 9.4* 9.8*  HCT 29.5* 29.5* 32.2* 29.8* 29.3*  PLT 212  --  272 247 258    COAGS: Recent Labs    09/20/17 1532  INR 1.11    BMP: Recent Labs    09/09/17 0434 09/20/17 1532 09/21/17 0352 09/22/17 0511  NA 138 138 139 141  K 4.4 4.0 3.9 3.8  CL 113* 105 104 109  CO2 18* 21* 21* 22  GLUCOSE 164* 180* 104* 114*  BUN 27* 14 15 16   CALCIUM 8.7* 9.2 8.9 9.1  CREATININE 1.62* 1.49* 1.50* 1.34*  GFRNONAA 44* 49* 48* 55*  GFRAA 51* 56* 56* >60    LIVER FUNCTION TESTS: Recent Labs    09/20/17 1532 09/21/17 0352 09/22/17 0511  BILITOT 6.3* 6.4* 4.1*  AST 99* 84* 60*  ALT 155* 136* 123*  ALKPHOS 301* 277* 266*  PROT 6.9 6.1* 6.3*  ALBUMIN 3.4* 3.1* 3.0*    TUMOR MARKERS: No results for input(s): AFPTM, CEA, CA199, CHROMGRNA in the last 8760 hours.  Assessment and Plan: Obstructive jaundice with liver lesions. For US guided liver lesion biopsy Labs ok Risks and benefits discussed with the patient including, but not limited to bleeding, infection, damage to adjacent structures or low yield requiring additional tests.  All of the patient's questions were answered, patient is agreeable to proceed. Consent signed and in chart.    Thank you for this interesting consult.  I greatly enjoyed meeting Jerry Weiss and look forward to participating in their care.  A copy of this report was sent to the requesting provider on this date.  Electronically Signed: Ascencion Dike, PA-C 09/22/2017, 9:47 AM   I spent a total of 20 minutes in face to face in clinical consultation, greater than 50% of which  was counseling/coordinating care for liver biopsy

## 2017-09-22 NOTE — Progress Notes (Signed)
Jerry Weiss 8:48 AM  Subjective: Patient doing well from this procedure and did tolerate liquids last night but is nothing by mouth right now for possible liver biopsy  Objective: Vital signs stable afebrile no acute distress abdomen is soft nontender white count about the same liver tests slight decrease  Assessment: Possible liver metastases and CBD obstruction status post brushing and unfortunately migrated stent  Plan: Could proceed with liver biopsy today if on radiology schedule otherwise can wait on the brushing from yesterday and keep nothing by mouth after midnight in case repeat ERCP to try to remove stent or bridge stent is needed otherwise could possibly go home tomorrow and wait on cytology and could even have outpatient liver biopsy if needed next week and will check on tomorrow morning and call sooner if any question or problem  [facility] E  Pager [phone] After 5PM or if no answer call [phone]

## 2017-09-22 NOTE — Procedures (Signed)
Pre Procedure Dx: Liver lesion  Post Procedural Dx: Same  Technically successful US guided biopsy of indeterminate lesion within the right lobe of the liver.   EBL: None  No immediate complications.   Ronny Bacon, MD Pager #: [phone]

## 2017-09-22 NOTE — Sedation Documentation (Signed)
Patient is resting comfortably. 

## 2017-09-23 ENCOUNTER — Inpatient Hospital Stay (HOSPITAL_COMMUNITY): Payer: BLUE CROSS/BLUE SHIELD

## 2017-09-23 ENCOUNTER — Encounter (HOSPITAL_COMMUNITY)
Admission: EM | Disposition: A | Discharge status: Home / Self Care | Payer: Self-pay | Source: Home / Self Care | Attending: Internal Medicine

## 2017-09-23 ENCOUNTER — Inpatient Hospital Stay (HOSPITAL_COMMUNITY): Payer: BLUE CROSS/BLUE SHIELD | Admitting: Anesthesiology

## 2017-09-23 ENCOUNTER — Encounter (HOSPITAL_COMMUNITY): Payer: Self-pay | Admitting: *Deleted

## 2017-09-23 HISTORY — PX: SPYGLASS CHOLANGIOSCOPY: SHX5441

## 2017-09-23 HISTORY — PX: BILIARY STENT PLACEMENT: SHX5538

## 2017-09-23 HISTORY — PX: ERCP: SHX5425

## 2017-09-23 LAB — GLUCOSE, CAPILLARY
GLUCOSE-CAPILLARY: 128 mg/dL — AB (ref 70–99)
GLUCOSE-CAPILLARY: 241 mg/dL — AB (ref 70–99)
GLUCOSE-CAPILLARY: 89 mg/dL (ref 70–99)
Glucose-Capillary: 91 mg/dL (ref 70–99)
Glucose-Capillary: 92 mg/dL (ref 70–99)
Glucose-Capillary: 235 mg/dL — ABNORMAL HIGH (ref 70–99)

## 2017-09-23 LAB — CBC
HCT: 29.5 % — ABNORMAL LOW (ref 39.0–52.0)
Hemoglobin: 9.3 g/dL — ABNORMAL LOW (ref 13.0–17.0)
MCH: 29.8 pg (ref 26.0–34.0)
MCHC: 31.5 g/dL (ref 30.0–36.0)
MCV: 94.6 fL (ref 78.0–100.0)
PLATELETS: 271 10*3/uL (ref 150–400)
RBC: 3.12 MIL/uL — ABNORMAL LOW (ref 4.22–5.81)
RDW: 17 % — ABNORMAL HIGH (ref 11.5–15.5)
WBC: 11.8 10*3/uL — ABNORMAL HIGH (ref 4.0–10.5)

## 2017-09-23 LAB — MAGNESIUM: MAGNESIUM: 1.8 mg/dL (ref 1.7–2.4)

## 2017-09-23 LAB — COMPREHENSIVE METABOLIC PANEL
ALT: 114 U/L — ABNORMAL HIGH (ref 0–44)
AST: 72 U/L — ABNORMAL HIGH (ref 15–41)
Albumin: 2.9 g/dL — ABNORMAL LOW (ref 3.5–5.0)
Alkaline Phosphatase: 316 U/L — ABNORMAL HIGH (ref 38–126)
Anion gap: 11 (ref 5–15)
BUN: 14 mg/dL (ref 8–23)
CHLORIDE: 107 mmol/L (ref 98–111)
CO2: 24 mmol/L (ref 22–32)
Calcium: 8.7 mg/dL — ABNORMAL LOW (ref 8.9–10.3)
Creatinine, Ser: 1.33 mg/dL — ABNORMAL HIGH (ref 0.61–1.24)
GFR calc Af Amer: >60 mL/min (ref >60)
GFR, EST NON AFRICAN AMERICAN: 56 mL/min — AB (ref >60)
Glucose, Bld: 96 mg/dL (ref 70–99)
POTASSIUM: 3.6 mmol/L (ref 3.5–5.1)
SODIUM: 142 mmol/L (ref 135–145)
Total Bilirubin: 3.1 mg/dL — ABNORMAL HIGH (ref 0.3–1.2)
Total Protein: 6 g/dL — ABNORMAL LOW (ref 6.5–8.1)

## 2017-09-23 SURGERY — ERCP, WITH INTERVENTION IF INDICATED
Anesthesia: General

## 2017-09-23 MED ORDER — SODIUM CHLORIDE 0.9 % IV SOLN: INTRAVENOUS | Status: DC

## 2017-09-23 MED ORDER — SUCCINYLCHOLINE CHLORIDE 200 MG/10ML IV SOSY
PREFILLED_SYRINGE | INTRAVENOUS | Status: DC | PRN
  Administered 2017-09-23: 100 mg via INTRAVENOUS

## 2017-09-23 MED ORDER — MIDAZOLAM HCL 5 MG/5ML IJ SOLN
INTRAMUSCULAR | Status: DC | PRN
  Administered 2017-09-23: 2 mg via INTRAVENOUS

## 2017-09-23 MED ORDER — IOPAMIDOL (ISOVUE-300) INJECTION 61%
INTRAVENOUS | Status: AC
  Filled 2017-09-23: qty 50

## 2017-09-23 MED ORDER — INDOMETHACIN 50 MG RE SUPP
RECTAL | Status: AC
  Filled 2017-09-23: qty 2

## 2017-09-23 MED ORDER — LIDOCAINE 2% (20 MG/ML) 5 ML SYRINGE
INTRAMUSCULAR | Status: DC | PRN
  Administered 2017-09-23: 40 mg via INTRAVENOUS

## 2017-09-23 MED ORDER — CEFAZOLIN SODIUM-DEXTROSE 1-4 GM/50ML-% IV SOLN
1.0000 g | Freq: Once | INTRAVENOUS | Status: AC
  Administered 2017-09-23: 2 g via INTRAVENOUS

## 2017-09-23 MED ORDER — ONDANSETRON HCL 4 MG/2ML IJ SOLN
INTRAMUSCULAR | Status: DC | PRN
  Administered 2017-09-23: 4 mg via INTRAVENOUS

## 2017-09-23 MED ORDER — FENTANYL CITRATE (PF) 100 MCG/2ML IJ SOLN
INTRAMUSCULAR | Status: DC | PRN
  Administered 2017-09-23: 100 ug via INTRAVENOUS

## 2017-09-23 MED ORDER — GLUCAGON HCL RDNA (DIAGNOSTIC) 1 MG IJ SOLR
INTRAMUSCULAR | Status: AC
  Filled 2017-09-23: qty 1

## 2017-09-23 MED ORDER — CEFAZOLIN SODIUM-DEXTROSE 1-4 GM/50ML-% IV SOLN
INTRAVENOUS | Status: AC
Start: 2017-09-23 — End: ?
  Filled 2017-09-23: qty 50

## 2017-09-23 MED ORDER — EPHEDRINE SULFATE-NACL 50-0.9 MG/10ML-% IV SOSY
PREFILLED_SYRINGE | INTRAVENOUS | Status: DC | PRN
  Administered 2017-09-23 (×3): 10 mg via INTRAVENOUS

## 2017-09-23 MED ORDER — SODIUM CHLORIDE 0.9 % IV SOLN
INTRAVENOUS | Status: DC | PRN
  Administered 2017-09-23: 25 mL

## 2017-09-23 MED ORDER — DEXAMETHASONE SODIUM PHOSPHATE 10 MG/ML IJ SOLN
INTRAMUSCULAR | Status: DC | PRN
  Administered 2017-09-23: 4 mg via INTRAVENOUS

## 2017-09-23 MED ORDER — PHENYLEPHRINE 40 MCG/ML (10ML) SYRINGE FOR IV PUSH (FOR BLOOD PRESSURE SUPPORT)
PREFILLED_SYRINGE | INTRAVENOUS | Status: DC | PRN
  Administered 2017-09-23: 120 ug via INTRAVENOUS

## 2017-09-23 MED ORDER — PROPOFOL 10 MG/ML IV BOLUS
INTRAVENOUS | Status: DC | PRN
  Administered 2017-09-23: 120 mg via INTRAVENOUS

## 2017-09-23 NOTE — Anesthesia Procedure Notes (Signed)
Procedure Name: Intubation Date/Time: 09/23/2017 10:13 AM Performed by: Moshe Salisbury, CRNA Pre-anesthesia Checklist: Patient identified, Emergency Drugs available, Suction available and Patient being monitored Patient Re-evaluated:Patient Re-evaluated prior to induction Oxygen Delivery Method: Circle System Utilized Preoxygenation: Pre-oxygenation with 100% oxygen Induction Type: IV induction Ventilation: Mask ventilation without difficulty Laryngoscope Size: 4 ([place]) Grade View: Grade I Tube type: Oral Number of attempts: 1 Airway Equipment and Method: Stylet Placement Confirmation: ETT inserted through vocal cords under direct vision,  positive ETCO2 and breath sounds checked- equal and bilateral Secured at: 22 cm Tube secured with: Tape Dental Injury: Teeth and Oropharynx as per pre-operative assessment

## 2017-09-23 NOTE — Transfer of Care (Signed)
Immediate Anesthesia Transfer of Care Note  Patient: Jerry Weiss  Procedure(s) Performed: ENDOSCOPIC RETROGRADE CHOLANGIOPANCREATOGRAPHY (ERCP) (N/A ) SPYGLASS CHOLANGIOSCOPY (N/A ) BILIARY STENT PLACEMENT  Patient Location: Endoscopy Unit  Anesthesia Type:General  Level of Consciousness: awake and patient cooperative  Airway & Oxygen Therapy: Patient Spontanous Breathing and Patient connected to nasal cannula oxygen  Post-op Assessment: Report given to RN, Post -op Vital signs reviewed and stable and Patient moving all extremities  Post vital signs: Reviewed and stable  Last Vitals:  Vitals Value Taken Time  BP    Temp 36.5 C 09/23/2017 11:48 AM  Pulse 64 09/23/2017 11:48 AM  Resp 11 09/23/2017 11:48 AM  SpO2 99 % 09/23/2017 11:48 AM    Last Pain:  Vitals:   09/23/17 1148  TempSrc: Oral  PainSc:       Patients Stated Pain Goal: 0 (31/59/45 [id number])  Complications: No apparent anesthesia complications

## 2017-09-23 NOTE — Progress Notes (Signed)
Jerry Weiss 9:21 AM  Subjective: Patient doing well today and  Ate fine yesterday after his liver biopsy any did have some pain from that yesterday but doing better today and has no new complaints and is feeling better overall  Objective: vvital signs stable afebrile exam please see preassessment evaluation LFTs slight decrease except for alkaline phosphatase CBC stable our previous brushing with atypia  Assessment: -distal CBD stricture with migrated stent although duct draining seemingly okay  Plan: Okay to proceed with attempt at repositioning current stent or replacing it or bridging it with anesthesia assistance and if procedure is well he could possibly go home this evening or tomorrow and follow-up with biopsies over the phone next week  [facility] E  Pager [phone] After 5PM or if no answer call [phone]

## 2017-09-23 NOTE — Progress Notes (Signed)
PROGRESS NOTE    Jerry Weiss  VZD:[id number] DOB: 1956/03/07 DOA: 09/20/2017 PCP: [name], MD   Brief Narrative:   62 male with past medical history of diabetes mellitus type 2, essential hypertension, recent type B aortic dissection, right renal mass status post nephrectomy, urinary retention requiring TURP about 2 weeks ago came to the hospital with complains of progressive abdominal discomfort, nausea and jaundice.  Patient's family member noted that he was getting more jaundiced on September 15, 2017 but failed to improve therefore went to urgent care clinic and was told his total bilirubin was elevated.  He was asked to follow-up outpatient therefore he follow with his primary care physician who recommended him getting admitted to the hospital for further work-up.  During the hospitalization he underwent ERCP on 09/21/2017 which showed normal major papilla, smooth still short stricture which was brushed for cytology, biliary sphincterotomy was performed and one covered metal stent was placed but unfortunately migrated to mid duct and second stent inside it cannot be placed.  Patient underwent liver biopsy on 7/11 and repeat ERCP on 09/23/2017.   Assessment & Plan:   Principal Problem:   Obstructive jaundice Active Problems:   Hypodense mass of liver   DM2 (diabetes mellitus, type 2) (HCC)   HTN (hypertension)   CKD (chronic kidney disease) stage 3, GFR 30-59 ml/min (HCC)  Painless jaundice, improving, total bilirubin trending down. - Unknown exact etiology.  CT of the abdomen pelvis showed unchanged cholelithiasis with gallbladder, intra-and extrahepatic duct distention.  3 questionable hypodensities in liver, postsurgical changes from right partial nephrectomy.  -Right upper quadrant ultrasound confirms liver lesion and distended gallbladder without evidence of gallbladder wall thickening. -Patient underwent ERCP on 09/21/2017 which showed small distal short stricture which was brushed  and sent for cytology, biliary sphincterotomy was performed and one stent was placed but unfortunately migrated up to mid duct therefore a second stent cannot be placed.  GI following.  When repeat ERCP on 09/23/2017-biliary stent was present in mid duct and was patent, 1 covered metal stent was placed into the common bile duct bridging onto the other stent. - Plans for clear liquid diet for next 6 hours, then he can have soft solid later on today.  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  Essential hypertension -Continue Norvasc 10 mg, lisinopril 40 mg, labetalol 400 mg 3 times daily  Chronic kidney disease stage III -Closely monitor renal function, avoid any nephrotoxic drugs.  Gentle hydration as necessary  Obstructive sleep apnea -Does not want CPAP.  DVT prophylaxis: SCDs Code Status: Full code Family Communication: None at bedside Disposition Plan: If tolerated Oral diet, will discharge tomorrow.   Consultants:   GI  Procedures:  ERCP 7/10- normal major papilla, smooth still short stricture which was brushed for cytology, biliary sphincterotomy was performed and one covered metal stent was placed but unfortunately migrated to mid duct and second stent inside it cannot be placed.  Liver biopsy 7/11  ERCP 7/12-normal major papilla.  Mid stent was patent, covered metal stent was placed bridging the other stent.  Antimicrobials:   None    Subjective: Patient seen in his room after his procedure.  He was somewhat groggy but able to answer appropriately.  No complaints at this time.  Review of Systems Otherwise negative except as per HPI, including: General = no fevers, chills, dizziness, malaise, fatigue HEENT/EYES = negative for pain, redness,  loss of vision, double vision, blurred vision, loss of hearing, sore throat, hoarseness, dysphagia Cardiovascular= negative for  chest pain, palpitation, murmurs, lower extremity swelling Respiratory/lungs= negative for shortness of breath, cough, hemoptysis, wheezing, mucus production Gastrointestinal= negative for nausea, vomiting,, abdominal pain, melena, hematemesis Genitourinary= negative for Dysuria, Hematuria, Change in Urinary Frequency MSK = Negative for arthralgia, myalgias, Back Pain, Joint swelling  Neurology= Negative for headache, seizures, numbness, tingling  Psychiatry= Negative for anxiety, depression, suicidal and homocidal ideation Allergy/Immunology= Medication/Food allergy as listed  Skin= Negative for Rash, lesions, ulcers, itching    Objective: Vitals:   09/23/17 0838 09/23/17 1148 09/23/17 1150 09/23/17 1200  BP: (!) 168/76 (!) 187/72 (!) 187/72 (!) 183/66  Pulse: 61 64 65 64  Resp: 16 11 19 12   Temp: 98.5 F (36.9 C) 97.7 F (36.5 C)    TempSrc: Oral Oral    SpO2: 97% 99% 99% 98%  Weight:      Height:        Intake/Output Summary (Last 24 hours) at 09/23/2017 1232 Last data filed at 09/23/2017 1146 Gross per 24 hour  Intake 650 ml  Output 0 ml  Net 650 ml   Filed Weights   09/21/17 0942 09/21/17 2038  Weight: 79.4 kg (175 lb) 79.3 kg (174 lb 13.2 oz)    Examination:  Constitutional: NAD, calm, comfortable Eyes: PERRL, lids and conjunctivae normal; +scleral icterus.  ENMT: Mucous membranes are moist. Posterior pharynx clear of any exudate or lesions.Normal dentition.   Neck: normal, supple, no masses, no thyromegaly Respiratory: clear to auscultation bilaterally, no wheezing, no crackles. Normal respiratory effort. No accessory muscle use.  Cardiovascular: Regular rate and rhythm, no murmurs / rubs / gallops. No extremity edema. 2+ pedal pulses. No carotid bruits.  Abdomen: no tenderness, no masses palpated. No hepatosplenomegaly. Bowel sounds positive.  Musculoskeletal: no clubbing / cyanosis. No joint deformity upper and lower extremities. Good ROM, no contractures.  Normal muscle tone.  Skin: +Jaundiced Neurologic: CN 2-12 grossly intact. Sensation intact, DTR normal. Strength 5/5 in all 4.  Psychiatric: Normal judgment and insight. Alert and oriented x 3. Normal mood.    Data Reviewed:   CBC: Recent Labs  Lab 09/20/17 1532 09/21/17 0352 09/22/17 0511 09/23/17 0456  WBC 10.8* 10.6* 11.7* 11.8*  NEUTROABS 8.4*  --   --   --   HGB 10.2* 9.4* 9.8* 9.3*  HCT 32.2* 29.8* 29.3* 29.5*  MCV 93.9 93.4 92.4 94.6  PLT 272 247 258 952   Basic Metabolic Panel: Recent Labs  Lab 09/20/17 1532 09/21/17 0352 09/22/17 0511 09/23/17 0456  NA 138 139 141 142  K 4.0 3.9 3.8 3.6  CL 105 104 109 107  CO2 21* 21* 22 24  GLUCOSE 180* 104* 114* 96  BUN 14 15 16 14   CREATININE 1.49* 1.50* 1.34* 1.33*  CALCIUM 9.2 8.9 9.1 8.7*  MG  --   --  1.9 1.8   GFR: Estimated Creatinine Clearance: 53.8 mL/min (A) (by C-G formula based on SCr of 1.33 mg/dL (H)). Liver Function Tests: Recent Labs  Lab 09/20/17 1532 09/21/17 0352 09/22/17 0511 09/23/17 0456  AST 99* 84* 60* 72*  ALT 155* 136* 123* 114*  ALKPHOS 301* 277* 266* 316*  BILITOT 6.3* 6.4* 4.1* 3.1*  PROT 6.9 6.1* 6.3* 6.0*  ALBUMIN 3.4* 3.1* 3.0* 2.9*   Recent Labs  Lab 09/20/17 1532  LIPASE 22   No results for input(s): AMMONIA in the last 168 hours. Coagulation Profile: Recent Labs  Lab  09/20/17 1532  INR 1.11   Cardiac Enzymes: No results for input(s): CKTOTAL, CKMB, CKMBINDEX, TROPONINI in the last 168 hours. BNP (last 3 results) No results for input(s): PROBNP in the last 8760 hours. HbA1C: No results for input(s): HGBA1C in the last 72 hours. CBG: Recent Labs  Lab 09/22/17 1709 09/22/17 2012 09/23/17 0012 09/23/17 0439 09/23/17 0813  GLUCAP 145* 203* 89 91 92   Lipid Profile: No results for input(s): CHOL, HDL, LDLCALC, TRIG, CHOLHDL, LDLDIRECT in the last 72 hours. Thyroid Function Tests: No results for input(s): TSH, T4TOTAL, FREET4, T3FREE, THYROIDAB in the last 72  hours. Anemia Panel: No results for input(s): VITAMINB12, FOLATE, FERRITIN, TIBC, IRON, RETICCTPCT in the last 72 hours. Sepsis Labs: No results for input(s): PROCALCITON, LATICACIDVEN in the last 168 hours.  No results found for this or any previous visit (from the past 240 hour(s)).       Radiology Studies: US Biopsy (liver)  Result Date: 09/22/2017 INDICATION: Concern for renal cell carcinoma, now with multiple liver lesions worrisome for metastatic disease. Please perform ultrasound-guided liver lesion biopsy for tissue diagnostic purposes. EXAM: ULTRASOUND GUIDED LIVER LESION BIOPSY COMPARISON:  CT of the abdomen and pelvis - 09/20/2017 MEDICATIONS: None ANESTHESIA/SEDATION: Fentanyl 100 mcg IV; Versed 1.5 mg IV Total Moderate Sedation time:  13 Minutes. The patient's level of consciousness and vital signs were monitored continuously by radiology nursing throughout the procedure under my direct supervision. COMPLICATIONS: None immediate. PROCEDURE: Informed written consent was obtained from the patient after a discussion of the risks, benefits and alternatives to treatment. The patient understands and consents the procedure. A timeout was performed prior to the initiation of the procedure. Ultrasound scanning was performed of the right upper abdominal quadrant demonstrates multiple hypoechoic nodules scattered throughout the right lobe of the liver. An approximately 1.7 x 1.8 cm hypoechoic nodule within the right lobe of the liver was targeted for biopsy given lesion location and sonographic window. The procedure was planned. The right upper abdominal quadrant was prepped and draped in the usual sterile fashion. The overlying soft tissues were anesthetized with 1% lidocaine with epinephrine. A 17 gauge, 6.8 cm co-axial needle was advanced into a peripheral aspect of the lesion. This was followed by 4 core biopsies with an 18 gauge core device under direct ultrasound guidance. The coaxial needle  tract was embolized with a small amount of Gel-Foam slurry and superficial hemostasis was obtained with manual compression. Post procedural scanning was negative for definitive area of hemorrhage or additional complication. A dressing was placed. The patient tolerated the procedure well without immediate post procedural complication. IMPRESSION: Technically successful ultrasound guided core needle biopsy of indeterminate lesion within the right lobe of the liver. Electronically Signed   By: Sandi Mariscal M.D.   On: 09/22/2017 17:21   Dg Ercp Biliary & Pancreatic Ducts  Result Date: 09/23/2017 CLINICAL DATA:  62 year old male for ERCP EXAM: ERCP TECHNIQUE: Multiple spot images obtained with the fluoroscopic device and submitted for interpretation post-procedure. FLUOROSCOPY TIME:  Fluoroscopy Time:  9 minutes COMPARISON:  09/21/2017 FINDINGS: Limited images during ERCP. Images demonstrate endoscope projecting over the upper abdomen, with overlapping metallic stents. IMPRESSION: Limited images during ERCP demonstrates overlapping metallic stents. Please refer to the dictated operative report for full details of intraoperative findings and procedure. Electronically Signed   By: Corrie Mckusick D.O.   On: 09/23/2017 12:27   Dg Ercp Biliary & Pancreatic Ducts  Result Date: 09/21/2017 CLINICAL DATA:  Common bile duct stone. Attempted biliary  stent placement. EXAM: ERCP TECHNIQUE: Multiple spot images obtained with the fluoroscopic device and submitted for interpretation post-procedure. FLUOROSCOPY TIME:  18 minutes, 17 seconds COMPARISON:  CT abdomen and pelvis - 09/21/2017; MRCP - 06/24/2017 FINDINGS: Two spot intraoperative fluoroscopic images of the right upper abdominal quadrant during ERCP are provided for review Initial image demonstrates an ERCP probe overlying the right upper abdominal quadrant with selective cannulation opacification of the common bile duct which appears mild to moderately dilated. There is  insufflation of a balloon within distal aspect of the CBD. Subsequent image demonstrates presumed sweeping of the CBD and presumed biliary sphincterotomy. IMPRESSION: ERCP as above. These images were submitted for radiologic interpretation only. Please see the procedural report for the amount of contrast and the fluoroscopy time utilized. Electronically Signed   By: Sandi Mariscal M.D.   On: 09/21/2017 12:59        Scheduled Meds: . amLODipine  10 mg Oral QHS  . gabapentin  600 mg Oral QHS  . hydrALAZINE  100 mg Oral Q8H  . insulin aspart  0-9 Units Subcutaneous Q4H  . labetalol  400 mg Oral TID  . lisinopril  40 mg Oral QPC supper  . pantoprazole  20 mg Oral Daily   Continuous Infusions: . sodium chloride 125 mL/hr at 09/23/17 0019     LOS: 3 days    I have spent 25 minutes face to face with the patient and on the ward discussing the patients care, assessment, plan and disposition with other care givers. >50% of the time was devoted counseling the patient about the risks and benefits of treatment and coordinating care.     Ankit Arsenio Loader, MD [facility] Pager [phone]   If 7PM-7AM, please contact night-coverage [email or web address] Password [email or web address] 09/23/2017, 12:32 PM

## 2017-09-23 NOTE — Anesthesia Postprocedure Evaluation (Addendum)
Anesthesia Post Note  Patient: Jerry Weiss  Procedure(s) Performed: ENDOSCOPIC RETROGRADE CHOLANGIOPANCREATOGRAPHY (ERCP) (N/A ) SPYGLASS CHOLANGIOSCOPY (N/A ) BILIARY STENT PLACEMENT     Patient location during evaluation: Endoscopy Anesthesia Type: General Level of consciousness: awake and alert, oriented and patient cooperative Pain management: pain level controlled Vital Signs Assessment: post-procedure vital signs reviewed and stable Respiratory status: spontaneous breathing, nonlabored ventilation and respiratory function stable Cardiovascular status: blood pressure returned to baseline and stable Postop Assessment: no apparent nausea or vomiting Anesthetic complications: no    Last Vitals:  Vitals:   09/23/17 1150 09/23/17 1200  BP: (!) 187/72 (!) 183/66  Pulse: 65 64  Resp: 19 12  Temp:    SpO2: 99% 98%    Last Pain:  Vitals:   09/23/17 1200  TempSrc:   PainSc: 0-No pain                 Shalisha Clausing,E. Treshaun Carrico

## 2017-09-23 NOTE — Progress Notes (Signed)
Patient last 3 BP's 180's.  T/O discontinue IV fluids and monitor bp.  If still elevated go ahead and give evening dose amlodipine.

## 2017-09-23 NOTE — Anesthesia Preprocedure Evaluation (Addendum)
Anesthesia Evaluation  Patient identified by MRN, date of birth, ID band Patient awake    Reviewed: Allergy & Precautions, NPO status , Patient's Chart, lab work & pertinent test results  History of Anesthesia Complications Negative for: history of anesthetic complications  Airway Mallampati: II  TM Distance: >3 FB Neck ROM: Full    Dental  (+) Dental Advisory Given   Pulmonary sleep apnea and Continuous Positive Airway Pressure Ventilation ,    breath sounds clear to auscultation       Cardiovascular hypertension, Pt. on medications (-) angina+ Peripheral Vascular Disease (Type B aortic dissection: The dissection flap extends from the thoracic aorta into the)   Rhythm:Regular Rate:Normal     Neuro/Psych negative neurological ROS     GI/Hepatic GERD  Controlled,Elevated LFTs   Endo/Other  diabetes, Oral Hypoglycemic Agents  Renal/GU L renal infarct     Musculoskeletal   Abdominal   Peds  Hematology  (+) Blood dyscrasia (Hb 9.3), ,   Anesthesia Other Findings   Reproductive/Obstetrics                            Anesthesia Physical Anesthesia Plan  ASA: III  Anesthesia Plan: General   Post-op Pain Management:    Induction: Intravenous  PONV Risk Score and Plan: 2 and Ondansetron and Dexamethasone  Airway Management Planned: Oral ETT  Additional Equipment:   Intra-op Plan:   Post-operative Plan: Extubation in OR  Informed Consent: I have reviewed the patients History and Physical, chart, labs and discussed the procedure including the risks, benefits and alternatives for the proposed anesthesia with the patient or authorized representative who has indicated his/her understanding and acceptance.   Dental advisory given  Plan Discussed with: CRNA and Surgeon  Anesthesia Plan Comments: (Plan routine monitors, GETA)        Anesthesia Quick Evaluation

## 2017-09-23 NOTE — Op Note (Signed)
Jerry Weiss Patient Name: Jerry Weiss Procedure Date : 09/23/2017 MRN: [id number] Attending MD: Clarene Essex , MD Date of Birth: 14-Jul-1955 CSN: [id number] Age: 62 Admit Type: Inpatient Procedure:                ERCP Indications:              Abnormal abdominal x-ray,bile duct obstruction                            migrated stent Providers:                Clarene Essex, MD, Burtis Junes, RN, Cherylynn Ridges,                            Technician, Claybon Jabs CRNA, CRNA Referring MD:              Medicines:                General Anesthesia Complications:            No immediate complications. Estimated Blood Loss:     Estimated blood loss: none. Procedure:                Pre-Anesthesia Assessment:                           - Prior to the procedure, a History and Physical                            was performed, and patient medications and                            allergies were reviewed. The patient's tolerance of                            previous anesthesia was also reviewed. The risks                            and benefits of the procedure and the sedation                            options and risks were discussed with the patient.                            All questions were answered, and informed consent                            was obtained. Prior Anticoagulants: The patient has                            taken no previous anticoagulant or antiplatelet                            agents. ASA Grade Assessment: II - A patient with                            mild  systemic disease. After reviewing the risks                            and benefits, the patient was deemed in                            satisfactory condition to undergo the procedure.                           After obtaining informed consent, the scope was                            passed under direct vision. Throughout the                            procedure, the patient's blood pressure, pulse, and                            oxygen saturations were monitored continuously. The                            [id number] scope was introduced through the                            mouth, and used to inject contrast into and used to                            cannulate the bile duct. The ERCP was somewhat                            difficult due to challenging cannulation because of                            abnormal anatomy. Successful completion of the                            procedure was aided by performing the maneuvers                            documented (below) in this report. The patient                            tolerated the procedure well. Scope In: Scope Out: Findings:      The major papilla was normal and the previously done sphincterotomy was       widely patent. and we initially tried to cannulate with the       sphincterotome through the stent but the wire seemed to go around the       stent or through it and not inside and we didn't inject dye a few times       and were concerned were not completely in the bile duct were there was a       leak so we elected to explored endoscopically using the SpyGlass direct       visualization system the  CBD which was readily done. The SpyScope was       advanced to the bifurcation. Visibility with the scope was good. The       middle third of the main bile duct contained one covered metal stent.       The stent was visibly patent.we did advance Spyglass through it and were       able to see the bifurcation and we advanced our wire first down one limb       of the intrahepatics which went to the left side and we withdrew the       wire and advanced into the other limb which went into the right side and       seem to be a straighter panel unfortunately as we withdrew spyglass the       wire continued to withdraw and we could not maintain position with the       wire so we repeated the above cannulation was spyglass and placed a  long       wire into the upper duct and this time with withdrawing spyglass the       elevator was easily able to hold the wire and we elected to place One 10       Fr by 6 cm covered metal stent was placed 5.5 cm into the common bile       duct. Bile flowed through the stent. The stent was in good position and       it overlapped the migrated stent nicely. the introducer and wire were       removed and the patient had adequate biliary drainage and there was no       obvious immediate complication and we elected to bridge this stent       rather than try to remove the stent with spyglass and the spy bite       forceps because of the questionable microperforations seen on dye       injection and also not mentioned above there was no pancreatic       injections or wires advancement throughout the procedure and as we       withdrew spyglass a distal inflamed stricture was confirmed which was       fairly short Impression:               - The major papilla appeared normal. no PD                            injection or wire advancement and questionable                            microperforation as above probably from                            manipulations wall trying to retrieve migrated                            stent on Wednesday                           - Presence of biliary stent in mid duct. This was  found to be patent.                           - One covered metal stent was placed into the                            common bile duct.bridging other stent as above Recommendation:           - Clear liquid diet for 6 hours. soft solids later                            today and if doing great could either go home after                            dinner or tomorrow and we will follow-up biopsy                            results on the phone or in the office next week                           - Continue present medications.                           - Await  path results.                           - Return to GI clinic after studies are complete.                           - Telephone GI clinic for pathology results in 4                            days.                           - Telephone GI clinic if symptomatic PRN. Procedure Code(s):        --- Professional ---                           [id number], Endoscopic retrograde                            cholangiopancreatography (ERCP); with placement of                            endoscopic stent into biliary or pancreatic duct,                            including pre- and post-dilation and guide wire                            passage, when performed, including sphincterotomy,                            when performed, each stent  [id number], Endoscopic cannulation of papilla with                            direct visualization of pancreatic/common bile                            duct(s) (List separately in addition to code(s) for                            primary procedure) Diagnosis Code(s):        --- Professional ---                           R93.5, Abnormal findings on diagnostic imaging of                            other abdominal regions, including retroperitoneum CPT copyright 2017 [employer]. All rights reserved. The codes documented in this report are preliminary and upon coder review may  be revised to meet current compliance requirements. Clarene Essex, MD 09/23/2017 12:15:53 PM This report has been signed electronically. Number of Addenda: 0

## 2017-09-24 LAB — COMPREHENSIVE METABOLIC PANEL
ALBUMIN: 3.2 g/dL — AB (ref 3.5–5.0)
ALK PHOS: 313 U/L — AB (ref 38–126)
ALT: 100 U/L — AB (ref 0–44)
AST: 57 U/L — AB (ref 15–41)
Anion gap: 11 (ref 5–15)
BUN: 13 mg/dL (ref 8–23)
CHLORIDE: 102 mmol/L (ref 98–111)
CO2: 25 mmol/L (ref 22–32)
CREATININE: 1.21 mg/dL (ref 0.61–1.24)
Calcium: 9.1 mg/dL (ref 8.9–10.3)
GFR calc non Af Amer: >60 mL/min (ref >60)
GLUCOSE: 117 mg/dL — AB (ref 70–99)
Potassium: 3.4 mmol/L — ABNORMAL LOW (ref 3.5–5.1)
SODIUM: 138 mmol/L (ref 135–145)
Total Bilirubin: 2.8 mg/dL — ABNORMAL HIGH (ref 0.3–1.2)
Total Protein: 6.6 g/dL (ref 6.5–8.1)

## 2017-09-24 LAB — CBC WITH DIFFERENTIAL/PLATELET
Abs Immature Granulocytes: 0.1 10*3/uL (ref 0.0–0.1)
BASOS PCT: 0 %
Basophils Absolute: 0 10*3/uL (ref 0.0–0.1)
EOS PCT: 0 %
Eosinophils Absolute: 0.1 10*3/uL (ref 0.0–0.7)
HEMATOCRIT: 31.4 % — AB (ref 39.0–52.0)
Hemoglobin: 10.1 g/dL — ABNORMAL LOW (ref 13.0–17.0)
Immature Granulocytes: 1 %
LYMPHS ABS: 1.8 10*3/uL (ref 0.7–4.0)
Lymphocytes Relative: 13 %
MCH: 29.9 pg (ref 26.0–34.0)
MCHC: 32.2 g/dL (ref 30.0–36.0)
MCV: 92.9 fL (ref 78.0–100.0)
MONO ABS: 1 10*3/uL (ref 0.1–1.0)
MONOS PCT: 7 %
Neutro Abs: 11.3 10*3/uL — ABNORMAL HIGH (ref 1.7–7.7)
Neutrophils Relative %: 79 %
Platelets: 305 10*3/uL (ref 150–400)
RBC: 3.38 MIL/uL — ABNORMAL LOW (ref 4.22–5.81)
RDW: 16.3 % — ABNORMAL HIGH (ref 11.5–15.5)
WBC: 14.3 10*3/uL — ABNORMAL HIGH (ref 4.0–10.5)

## 2017-09-24 LAB — GLUCOSE, CAPILLARY
GLUCOSE-CAPILLARY: 142 mg/dL — AB (ref 70–99)
GLUCOSE-CAPILLARY: 148 mg/dL — AB (ref 70–99)
Glucose-Capillary: 109 mg/dL — ABNORMAL HIGH (ref 70–99)
Glucose-Capillary: 113 mg/dL — ABNORMAL HIGH (ref 70–99)
Glucose-Capillary: 113 mg/dL — ABNORMAL HIGH (ref 70–99)
Glucose-Capillary: 191 mg/dL — ABNORMAL HIGH (ref 70–99)

## 2017-09-24 LAB — MAGNESIUM: Magnesium: 1.8 mg/dL (ref 1.7–2.4)

## 2017-09-24 MED ORDER — INSULIN ASPART 100 UNIT/ML ~~LOC~~ SOLN
0.0000 [IU] | Freq: Three times a day (TID) | SUBCUTANEOUS | Status: DC
  Administered 2017-09-25 (×2): 2 [IU] via SUBCUTANEOUS

## 2017-09-24 MED ORDER — SENNA 8.6 MG PO TABS
1.0000 | ORAL_TABLET | Freq: Every day | ORAL | Status: DC
  Administered 2017-09-24 – 2017-09-25 (×2): 8.6 mg via ORAL
  Filled 2017-09-24 (×2): qty 1

## 2017-09-24 MED ORDER — POLYETHYLENE GLYCOL 3350 17 G PO PACK
17.0000 g | PACK | Freq: Two times a day (BID) | ORAL | Status: DC
  Administered 2017-09-24 – 2017-09-25 (×3): 17 g via ORAL
  Filled 2017-09-24 (×4): qty 1

## 2017-09-24 MED ORDER — POTASSIUM CHLORIDE CRYS ER 20 MEQ PO TBCR
40.0000 meq | EXTENDED_RELEASE_TABLET | Freq: Once | ORAL | Status: AC
Start: 2017-09-24 — End: 2017-09-24
  Administered 2017-09-24: 40 meq via ORAL
  Filled 2017-09-24: qty 2

## 2017-09-24 NOTE — Progress Notes (Signed)
Jerry Weiss 10:12 AM  Subjective: Patient without any obvious post ERCP problems does have some back pain that he thinks is due to lying in bed and lying on his side from the procedure and is not worse with eating and is tolerating his diet and has no new complaint  Objective: Vital signs stable afebrile no acute distress abdomen is soft nontender we discussed the atypia on his brushing liver tests slight decrease white count slight increase  Assessment: CBD obstruction status post stenting  Plan: Follow-up liver biopsy on the phone and he'll call me sooner when necessary but okay with me to go home with close outpatient follow-up and his wife saw Dr. [name] who if needed we will set up consult ASAP  [facility] E  Pager [phone] After 5PM or if no answer call [phone]

## 2017-09-24 NOTE — Progress Notes (Signed)
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  During his work-up a right renal mass was noted.  Further investigation revealed a renal cell carcinoma which was resected and a partial nephrectomy in mid May.  Course was complicated by urinary retention.  Underwent TURP approximately 2 weeks ago.  Presented with nausea, abdominal discomfort and jaundice.  Admitted for further evaluation of obstructive jaundice.  Seen by GI and underwent ERCP, biliary sphincterotomy, stent placement in the common bile duct which migrated to the mid duct and could not be removed.  He underwent liver biopsy and repeat ERCP with placement of a second stent.  Assessment/Plan Obstructive jaundice, painless.  CT abdomen pelvis showed cholelithiasis, intra-and extrahepatic duct distention, 3 questionable hypodensities in the liver.  Right upper quadrant ultrasound confirmed liver lesion and distended gallbladder.  Improving status post ERCP x2 --Cleared by GI for discharge.  Follow-up biopsies in the outpatient.  Hypodense mass in the liver --Status post liver biopsy.  Follow-up with GI as an outpatient.  Diabetes mellitus type 2 with peripheral neuropathy --Blood sugars stable.  Continue sliding scale insulin.  Continue gabapentin  Essential hypertension --Stable.  Continue Norvasc, lisinopril, labetalol  Obstructive sleep apnea --declines CPAP in hospital   Feels poorly, poor appetite, some abdominal pain.  Will monitor clinically overnight.  Probable discharge 7/14 if improved.  DVT prophylaxis: SCDs Code Status: Full Family Communication: none Disposition Plan: home    Murray Hodgkins, MD  [facility] Direct contact: [phone] --Via amion app OR  --[email or web address]; password [email or web address]  7PM-7AM  contact night coverage as above 09/24/2017, 4:30 PM  LOS: 4 days   Consultants:  GI  Procedures:  ERCP Impression:               - The major papilla appeared normal. there was a                            smooth distal short stricture seen which was                            brushed for cytology                           - A biliary sphincterotomy was performed.                           - The biliary tree was swept and sludge was found.                           - One covered metal stent was placed into the                            common bile duct. unfortunately it migrated into                            the mid duct and could not be removed as above and  we could not place a second metal stent inside it                            and we stopped the procedure at that point  Technically successful US guided biopsy of indeterminate lesion within the right lobe of the liver.   ERCP Impression:               - The major papilla appeared normal. no PD                            injection or wire advancement and questionable                            microperforation as above probably from                            manipulations wall trying to retrieve migrated                            stent on Wednesday                           - Presence of biliary stent in mid duct. This was                            found to be patent.                           - One covered metal stent was placed into the                            common bile duct.bridging other stent as above    Antimicrobials:    Interval history/Subjective: Seen by GI today and cleared for discharge.  Afebrile, vital signs stable  Reports some abdominal pain, nausea, poor appetite, did not eat lunch.  Does not feel well.  Objective: Vitals:  Vitals:   09/24/17 0440 09/24/17 1000  BP: (!) 153/79 130/66  Pulse: 71 70  Resp: 18 18  Temp: 98 F (36.7 C) 98.3 F (36.8 C)    SpO2: 93% 97%    Exam:  Constitutional:  . Appears calm but mildly uncomfortable. Eyes:  . pupils appear normal ENMT:  . grossly normal hearing  Respiratory:  . CTA bilaterally, no w/r/r  . Respiratory effort normal Cardiovascular:  . RRR, no m/r/g . No LE extremity edema   Abdomen:  . Soft, nontender, nondistended Psychiatric:  . Mental status o Mood, affect appropriate  I have personally reviewed the following:   Labs:  Blood sugars stable.  Basic metabolic panel unremarkable.  Creatinine 1.21.  Magnesium within normal limits.  Alkaline phosphatase without significant change, 313.  AST and ALT trending down.  Total bilirubin trending down.  WBC 14.3, hemoglobin stable 10.1, platelets within normal limits.  Scheduled Meds: . amLODipine  10 mg Oral QHS  . gabapentin  600 mg Oral QHS  . hydrALAZINE  100 mg Oral Q8H  . insulin aspart  0-9 Units Subcutaneous Q4H  . labetalol  400 mg Oral TID  . lisinopril  40 mg  Oral QPC supper  . pantoprazole  20 mg Oral Daily  . polyethylene glycol  17 g Oral BID  . senna  1 tablet Oral QHS   Continuous Infusions:  Principal Problem:   Obstructive jaundice Active Problems:   OSA (obstructive sleep apnea)   Hypodense mass of liver   DM2 (diabetes mellitus, type 2) (H[facility])   HTN (hypertension)   LOS: 4 days

## 2017-09-25 ENCOUNTER — Encounter (HOSPITAL_COMMUNITY): Payer: Self-pay | Admitting: Gastroenterology

## 2017-09-25 DIAGNOSIS — R1084 Generalized abdominal pain: Secondary | ICD-10-CM

## 2017-09-25 LAB — COMPREHENSIVE METABOLIC PANEL
ALBUMIN: 3 g/dL — AB (ref 3.5–5.0)
ALT: 90 U/L — AB (ref 0–44)
AST: 60 U/L — ABNORMAL HIGH (ref 15–41)
Alkaline Phosphatase: 332 U/L — ABNORMAL HIGH (ref 38–126)
Anion gap: 9 (ref 5–15)
BUN: 20 mg/dL (ref 8–23)
CHLORIDE: 104 mmol/L (ref 98–111)
CO2: 26 mmol/L (ref 22–32)
CREATININE: 1.41 mg/dL — AB (ref 0.61–1.24)
Calcium: 9.1 mg/dL (ref 8.9–10.3)
GFR calc Af Amer: >60 mL/min (ref >60)
GFR calc non Af Amer: 52 mL/min — ABNORMAL LOW (ref >60)
GLUCOSE: 109 mg/dL — AB (ref 70–99)
POTASSIUM: 4.4 mmol/L (ref 3.5–5.1)
Sodium: 139 mmol/L (ref 135–145)
Total Bilirubin: 2.8 mg/dL — ABNORMAL HIGH (ref 0.3–1.2)
Total Protein: 6.2 g/dL — ABNORMAL LOW (ref 6.5–8.1)

## 2017-09-25 LAB — CBC
HCT: 31.3 % — ABNORMAL LOW (ref 39.0–52.0)
HEMOGLOBIN: 10.1 g/dL — AB (ref 13.0–17.0)
MCH: 30.6 pg (ref 26.0–34.0)
MCHC: 32.3 g/dL (ref 30.0–36.0)
MCV: 94.8 fL (ref 78.0–100.0)
PLATELETS: 281 10*3/uL (ref 150–400)
RBC: 3.3 MIL/uL — ABNORMAL LOW (ref 4.22–5.81)
RDW: 16.5 % — ABNORMAL HIGH (ref 11.5–15.5)
WBC: 9.9 10*3/uL (ref 4.0–10.5)

## 2017-09-25 LAB — MAGNESIUM: MAGNESIUM: 1.8 mg/dL (ref 1.7–2.4)

## 2017-09-25 LAB — GLUCOSE, CAPILLARY
GLUCOSE-CAPILLARY: 173 mg/dL — AB (ref 70–99)
Glucose-Capillary: 106 mg/dL — ABNORMAL HIGH (ref 70–99)
Glucose-Capillary: 187 mg/dL — ABNORMAL HIGH (ref 70–99)
Glucose-Capillary: 185 mg/dL — ABNORMAL HIGH (ref 70–99)

## 2017-09-25 MED ORDER — POLYETHYLENE GLYCOL 3350 17 G PO PACK
17.0000 g | PACK | Freq: Three times a day (TID) | ORAL | Status: DC | PRN
  Administered 2017-09-25: 17 g via ORAL
  Filled 2017-09-25: qty 1

## 2017-09-25 NOTE — Progress Notes (Signed)
Jerry Weiss 9:01 AM  Subjective: Patient is having some lower abdominal discomfort and has not moved his bowels since Tuesday but is eating fine without nausea or vomiting and is back pain is better he has not had any fever and he has no urinary complaints  Objective: Vital signs stable afebrile no acute distress abdomen is soft nontender occasional bowel sounds white count normal liver tests about the same  Assessment: Lower abdominal pain possibly just constipation  Plan: Will increase MiraLAX today and if better after a bowel movement and hopefully can go home if not might need repeat CT scan just to be sure in a day or 2 otherwise will wait on biopsies to decide how to proceed  [facility]  Pager [phone] After 5PM or if no answer call [phone]

## 2017-09-25 NOTE — Progress Notes (Addendum)
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  During his work-up a right renal mass was noted.  Further investigation revealed a renal cell carcinoma which was resected and a partial nephrectomy in mid May.  Course was complicated by urinary retention.  Underwent TURP approximately 2 weeks ago.  Presented with nausea, abdominal discomfort and jaundice.  Admitted for further evaluation of obstructive jaundice.  Seen by GI and underwent ERCP, biliary sphincterotomy, stent placement in the common bile duct which migrated to the mid duct and could not be removed.  He underwent liver biopsy and repeat ERCP with placement of a second stent.  Assessment/Plan Obstructive jaundice, painless.  CT abdomen pelvis showed cholelithiasis, intra-and extrahepatic duct distention, 3 questionable hypodensities in the liver.  Right upper quadrant ultrasound confirmed liver lesion and distended gallbladder.  Improving status post ERCP x2 --Seen again by GI today, plan for increased laxative.  Patient is constipated.  It has a bowel movement and feels better, can go home later today, otherwise may need further imaging.   --Blood work remains stable.  Hypodense mass in the liver --Follow-up liver biopsy with GI  Diabetes mellitus type 2 with peripheral neuropathy --Blood sugars remain stable.  Continue sliding scale insulin.  Essential hypertension --Remains stable.  Continue amlodipine, labetalol, lisinopril.  Obstructive sleep apnea --declines CPAP in hospital   Continues to have abdominal discomfort, worse after BM. Will continue to monitor.  DVT prophylaxis: SCDs Code Status: Full Family Communication: none Disposition Plan: home    Murray Hodgkins, MD  [facility] Direct contact:  [phone] --Via amion app OR  --[email or web address]; password [email or web address]  7PM-7AM contact night coverage as above 09/25/2017, 11:45 AM  LOS: 5 days   Consultants:  GI  Procedures:  ERCP Impression:               - The major papilla appeared normal. there was a                            smooth distal short stricture seen which was                            brushed for cytology                           - A biliary sphincterotomy was performed.                           - The biliary tree was swept and sludge was found.                           - One covered metal stent was placed into the                            common bile duct. unfortunately it migrated into                            the mid duct and could not be removed as above and  we could not place a second metal stent inside it                            and we stopped the procedure at that point  Technically successful US guided biopsy of indeterminate lesion within the right lobe of the liver.   ERCP Impression:               - The major papilla appeared normal. no PD                            injection or wire advancement and questionable                            microperforation as above probably from                            manipulations wall trying to retrieve migrated                            stent on Wednesday                           - Presence of biliary stent in mid duct. This was                            found to be patent.                           - One covered metal stent was placed into the                            common bile duct.bridging other stent as above    Antimicrobials:    Interval history/Subjective: Feels better today than yesterday but still has some abdominal discomfort.  Was able to eat dinner last night and breakfast this morning.  Objective: Vitals:  Vitals:   09/25/17 0639 09/25/17 0816  BP: 140/75 138/69  Pulse: 69 70  Resp:  18  Temp:   98.4 F (36.9 C)  SpO2:  95%    Exam: Constitutional:   . Appears calm and comfortable Cardiovascular:  . RRR, no m/r/g  Respiratory.  Clear to auscultation bilaterally.  No wheezes, rales or rhonchi.  Normal respiratory effort. Psychiatric:  . Mental status o Mood, affect appropriate   I have personally reviewed the following:   Labs:  Blood sugars stable.  Renal function without significant change.  Potassium within normal limits.  LFTs without significant change.  Leukocytosis has resolved.  Hemoglobin stable 10.1.  Scheduled Meds: . amLODipine  10 mg Oral QHS  . gabapentin  600 mg Oral QHS  . hydrALAZINE  100 mg Oral Q8H  . insulin aspart  0-9 Units Subcutaneous TID WC  . labetalol  400 mg Oral TID  . lisinopril  40 mg Oral QPC supper  . pantoprazole  20 mg Oral Daily  . senna  1 tablet Oral QHS   Continuous Infusions:  Principal Problem:   Obstructive jaundice Active Problems:   OSA (obstructive sleep apnea)   Hypodense mass of liver   DM2 (diabetes mellitus,  type 2) ([place])   HTN (hypertension)   LOS: 5 days

## 2017-09-26 LAB — CBC
HCT: 29.9 % — ABNORMAL LOW (ref 39.0–52.0)
Hemoglobin: 9.4 g/dL — ABNORMAL LOW (ref 13.0–17.0)
MCH: 29.8 pg (ref 26.0–34.0)
MCHC: 31.4 g/dL (ref 30.0–36.0)
MCV: 94.9 fL (ref 78.0–100.0)
PLATELETS: 271 10*3/uL (ref 150–400)
RBC: 3.15 MIL/uL — AB (ref 4.22–5.81)
RDW: 16.2 % — AB (ref 11.5–15.5)
WBC: 9.4 10*3/uL (ref 4.0–10.5)

## 2017-09-26 LAB — COMPREHENSIVE METABOLIC PANEL
ALBUMIN: 2.8 g/dL — AB (ref 3.5–5.0)
ALT: 71 U/L — ABNORMAL HIGH (ref 0–44)
ANION GAP: 8 (ref 5–15)
AST: 41 U/L (ref 15–41)
Alkaline Phosphatase: 265 U/L — ABNORMAL HIGH (ref 38–126)
BILIRUBIN TOTAL: 2 mg/dL — AB (ref 0.3–1.2)
BUN: 20 mg/dL (ref 8–23)
CHLORIDE: 104 mmol/L (ref 98–111)
CO2: 26 mmol/L (ref 22–32)
Calcium: 8.6 mg/dL — ABNORMAL LOW (ref 8.9–10.3)
Creatinine, Ser: 1.27 mg/dL — ABNORMAL HIGH (ref 0.61–1.24)
GFR calc Af Amer: >60 mL/min (ref >60)
GFR, EST NON AFRICAN AMERICAN: 59 mL/min — AB (ref >60)
Glucose, Bld: 117 mg/dL — ABNORMAL HIGH (ref 70–99)
Potassium: 3.6 mmol/L (ref 3.5–5.1)
Sodium: 138 mmol/L (ref 135–145)
TOTAL PROTEIN: 5.8 g/dL — AB (ref 6.5–8.1)

## 2017-09-26 LAB — GLUCOSE, CAPILLARY: GLUCOSE-CAPILLARY: 118 mg/dL — AB (ref 70–99)

## 2017-09-26 LAB — MAGNESIUM: MAGNESIUM: 1.5 mg/dL — AB (ref 1.7–2.4)

## 2017-09-26 MED ORDER — MAGNESIUM SULFATE 2 GM/50ML IV SOLN
2.0000 g | Freq: Once | INTRAVENOUS | Status: DC
  Filled 2017-09-26: qty 50

## 2017-09-26 MED ORDER — POLYETHYLENE GLYCOL 3350 17 G PO PACK: 17.0000 g | PACK | Freq: Every day | ORAL | Status: DC | PRN

## 2017-09-26 MED ORDER — MAGNESIUM OXIDE 400 (241.3 MG) MG PO TABS
800.0000 mg | ORAL_TABLET | Freq: Once | ORAL | Status: AC
  Administered 2017-09-26: 800 mg via ORAL
  Filled 2017-09-26: qty 2

## 2017-09-26 NOTE — Discharge Summary (Signed)
Physician Discharge Summary  Jerry Weiss LZJ:[id number] DOB: [birth year] DOA: 09/20/2017  PCP: [name], MD  Admit date: 09/20/2017 Discharge date: 09/26/2017  Recommendations for Outpatient Follow-up:   Obstructive jaundice, painless.    Hypodense mass in the liver --Follow-up liver biopsy with GI   Follow-up Information    Clarene Essex, MD. Schedule an appointment as soon as possible for a visit in 2 week(s).   Specialty:  Gastroenterology Contact information: [address] [place] [address] [phone]            Discharge Diagnoses:  1. Obstructive jaundice, painless 2. Hypodense mass in the liver 3. Diabetes mellitus type 2 with peripheral neuropathy 4. Essential hypertension 5. 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  During his work-up a right renal mass was noted.  Further investigation revealed a renal cell carcinoma which was resected and a partial nephrectomy in mid May.  Course was complicated by urinary retention.  Underwent TURP approximately 2 weeks ago.  Presented with nausea, abdominal discomfort and jaundice. Admitted for further evaluation of obstructive jaundice.   Hospital Course:  Seen by GI and underwent ERCP, biliary sphincterotomy, stent placement in the common bile duct which migrated to the mid duct and could not be removed.  He underwent liver biopsy and repeat ERCP with placement of a second stent.  His condition gradually improved and he is not tolerating a diet.  He was cleared by GI for discharge when diet improved and pain decreased.  He will follow-up with GI as an  outpatient to follow-up biopsy results and for further recommendations.  GI will coordinate outpatient follow-up with oncology if indicated.  Obstructive jaundice, painless.  CT abdomen pelvis showed cholelithiasis, intra-and extrahepatic duct distention, 3 questionable hypodensities in the liver.  Right upper quadrant ultrasound confirmed liver lesion and distended gallbladder.  Improving status post ERCP x2 --LFTs continue to improve.  Symptomatically improved as well.  Bowels moving.  Pathology pending from ERCP procedure.  As an outpatient  Hypodense mass in the liver --Follow-up liver biopsy with GI  Diabetes mellitus type 2 with peripheral neuropathy --Blood sugars stable.    Essential hypertension --Stable.  Continue amlodipine, labetalol, lisinopril.  Obstructive sleep apnea --declines CPAP in hospital  Consultants:  GI  Procedures:  ERCP Impression: - The major papilla appeared normal. there was a  smooth distal short stricture seen which was  brushed for cytology - A biliary sphincterotomy was performed. - The biliary tree was swept and sludge was found. - One covered metal stent was placed into the  common bile duct. unfortunately it migrated into  the mid duct and could not be removed as above and  we could not place a second metal stent inside it  and we stopped the procedure at that point  Technically successful US guided biopsy ofindeterminate lesion within the right lobe of the liver.  ERCP Impression: - The major papilla appeared normal. no PD  injection or wire advancement and questionable  microperforation as above probably from    manipulations wall trying to retrieve migrated  stent on Wednesday - Presence of biliary stent in mid duct.  This was  found to be patent. - One covered metal stent was placed into the  common bile duct.bridging other stent as above  Today's assessment: S: Feels better today.  Just had another bowel movement.  Tolerating diet.  Abdominal pain minimal.  Ready to go home. O: Vitals:  Vitals:   09/26/17 0551 09/26/17 0942  BP: (!) 153/74 (!) 146/69  Pulse: 68 68  Resp: 16 18  Temp: 98.7 F (37.1 C) 98.7 F (37.1 C)  SpO2: 96% 95%    Constitutional:  . Appears calm and comfortable Respiratory:  . CTA bilaterally, no w/r/r.  . Respiratory effort normal.  Cardiovascular:  . RRR, no m/r/g Abdomen:  Soft, ntnd Psychiatric:  . Mental status o Mood, affect appropriate  CBG stable.  CMP reviewed, LFTs and general trending down.  Magnesium slightly low 1.5, will replete.  Hemoglobin stable 9.4.  Normal WBC, platelets.  Discharge Instructions  Discharge Instructions    Diet - low sodium heart healthy   Complete by:  As directed    Diet Carb Modified   Complete by:  As directed    Discharge instructions   Complete by:  As directed    Call your physician or seek immediate medical attention for pain, vomiting, poor appetite, yellow skin or eyes or worsening of condition.   Increase activity slowly   Complete by:  As directed      Allergies as of 09/26/2017      Reactions   Ciprofloxacin    Codeine Other (See Comments)   Hallucinations    Shellfish-derived Products Swelling   "Eyes swell shut"      Medication List    TAKE these medications   amLODipine 10 MG tablet Commonly known as:  NORVASC Take 10 mg by mouth daily.   gabapentin 300 MG capsule Commonly known as:  NEURONTIN Take 300 mg by mouth at bedtime.    hydrALAZINE 100 MG tablet Commonly known as:  APRESOLINE Take 100 mg by mouth 3 (three) times daily.   labetalol 200 MG tablet Commonly known as:  NORMODYNE Take 400 mg by mouth 3 (three) times daily.   lisinopril 20 MG tablet Commonly known as:  PRINIVIL,ZESTRIL Take 40 mg by mouth daily.   neomycin-polymyxin-dexameth 0.1 % Oint Commonly known as:  MAXITROL Place 1 application into both eyes daily as needed (eye irritation).   ondansetron 4 MG tablet Commonly known as:  ZOFRAN Take 4 mg by mouth 2 (two) times daily as needed for nausea/vomiting.   pantoprazole 20 MG tablet Commonly known as:  PROTONIX Take 20 mg by mouth daily.   pioglitazone 15 MG tablet Commonly known as:  ACTOS Take 15 mg by mouth daily.   polyethylene glycol packet Commonly known as:  MIRALAX / GLYCOLAX Take 17 g by mouth daily as needed for mild constipation or moderate constipation.   sitaGLIPtin 100 MG tablet Commonly known as:  JANUVIA Take 100 mg by mouth daily.      Allergies  Allergen Reactions  . Ciprofloxacin   . Codeine Other (See Comments)    Hallucinations   . Shellfish-Derived Products Swelling    "Eyes swell shut"    The results of significant diagnostics from this hospitalization (including imaging, microbiology, ancillary and laboratory) are listed below for reference.    Significant Diagnostic Studies: Ct Abdomen Pelvis Wo Contrast  Result Date: 09/20/2017 CLINICAL DATA:  Fatigue with nausea and vomiting. Recent TURP 2 weeks ago. History of renal cancer status post right partial nephrectomy.  EXAM: CT ABDOMEN AND PELVIS WITHOUT CONTRAST TECHNIQUE: Multidetector CT imaging of the abdomen and pelvis was performed following the standard protocol without IV contrast. COMPARISON:  CT chest, abdomen, and pelvis dated May 25, 2017. FINDINGS: Lower chest: Linear scarring in the right lower lobe. Small pericardial effusion. Hepatobiliary: There are three questionable new hypodense  lesions within the peripheral liver, measuring up to 2.9 cm. Unchanged cholelithiasis with new mild gallbladder distention. No gallbladder wall thickening. New common bile duct dilatation, measuring 1.4 cm in diameter, and mild central intrahepatic biliary dilatation. Pancreas: Unremarkable. No pancreatic ductal dilatation or surrounding inflammatory changes. Spleen: Normal in size without focal abnormality. Adrenals/Urinary Tract: The right adrenal gland is unremarkable. Unchanged 1.9 cm left adrenal adenoma. Unchanged infarcted appearance of the left kidney. Postsurgical changes related to interval right partial nephrectomy without definite focal lesion, although evaluation is limited without intravenous contrast. No renal or ureteral calculi. No hydronephrosis. The bladder is decompressed. Stomach/Bowel: Stomach is within normal limits. Appendix appears normal. No evidence of bowel wall thickening, distention, or inflammatory changes. Vascular/Lymphatic: Aortic atherosclerosis. No enlarged abdominal or pelvic lymph nodes. Reproductive: Mild prostatomegaly, unchanged. Other: No free fluid or pneumoperitoneum. Unchanged fat containing supraumbilical ventral hernia and umbilical hernia. Musculoskeletal: No acute or significant osseous findings. IMPRESSION: 1. Unchanged cholelithiasis with new mild gallbladder distention and intra- and extrahepatic biliary dilatation. No definite obstructing stone or lesion. Recommend correlation with LFTs and either ERCP or MRCP for further evaluation. 2. Three questionable new hypodensities within the peripheral liver measuring up to 2.9 cm, incompletely evaluated without intravenous contrast. Recommend right upper quadrant ultrasound to confirm these findings prior to potential further evaluation with MRI. 3. Postsurgical changes related to interval right partial nephrectomy. Evaluation for residual or recurrent tumor is limited without intravenous contrast. 4. Unchanged left  renal infarction. 5. Small pericardial effusion. 6.  Aortic atherosclerosis (ICD10-I70.0). Electronically Signed   By: Titus Dubin M.D.   On: 09/20/2017 16:33   US Biopsy (liver)  Result Date: 09/22/2017 INDICATION: Concern for renal cell carcinoma, now with multiple liver lesions worrisome for metastatic disease. Please perform ultrasound-guided liver lesion biopsy for tissue diagnostic purposes. EXAM: ULTRASOUND GUIDED LIVER LESION BIOPSY COMPARISON:  CT of the abdomen and pelvis - 09/20/2017 MEDICATIONS: None ANESTHESIA/SEDATION: Fentanyl 100 mcg IV; Versed 1.5 mg IV Total Moderate Sedation time:  13 Minutes. The patient's level of consciousness and vital signs were monitored continuously by radiology nursing throughout the procedure under my direct supervision. COMPLICATIONS: None immediate. PROCEDURE: Informed written consent was obtained from the patient after a discussion of the risks, benefits and alternatives to treatment. The patient understands and consents the procedure. A timeout was performed prior to the initiation of the procedure. Ultrasound scanning was performed of the right upper abdominal quadrant demonstrates multiple hypoechoic nodules scattered throughout the right lobe of the liver. An approximately 1.7 x 1.8 cm hypoechoic nodule within the right lobe of the liver was targeted for biopsy given lesion location and sonographic window. The procedure was planned. The right upper abdominal quadrant was prepped and draped in the usual sterile fashion. The overlying soft tissues were anesthetized with 1% lidocaine with epinephrine. A 17 gauge, 6.8 cm co-axial needle was advanced into a peripheral aspect of the lesion. This was followed by 4 core biopsies with an 18 gauge core device under direct ultrasound guidance. The coaxial needle tract was embolized with a small amount of Gel-Foam slurry and superficial hemostasis was obtained with manual compression. Post procedural scanning was  negative  for definitive area of hemorrhage or additional complication. A dressing was placed. The patient tolerated the procedure well without immediate post procedural complication. IMPRESSION: Technically successful ultrasound guided core needle biopsy of indeterminate lesion within the right lobe of the liver. Electronically Signed   By: Sandi Mariscal M.D.   On: 09/22/2017 17:21   Dg Ercp Biliary & Pancreatic Ducts  Result Date: 09/23/2017 CLINICAL DATA:  62 year old male for ERCP EXAM: ERCP TECHNIQUE: Multiple spot images obtained with the fluoroscopic device and submitted for interpretation post-procedure. FLUOROSCOPY TIME:  Fluoroscopy Time:  9 minutes COMPARISON:  09/21/2017 FINDINGS: Limited images during ERCP. Images demonstrate endoscope projecting over the upper abdomen, with overlapping metallic stents. IMPRESSION: Limited images during ERCP demonstrates overlapping metallic stents. Please refer to the dictated operative report for full details of intraoperative findings and procedure. Electronically Signed   By: Corrie Mckusick D.O.   On: 09/23/2017 12:27   Dg Ercp Biliary & Pancreatic Ducts  Result Date: 09/21/2017 CLINICAL DATA:  Common bile duct stone. Attempted biliary stent placement. EXAM: ERCP TECHNIQUE: Multiple spot images obtained with the fluoroscopic device and submitted for interpretation post-procedure. FLUOROSCOPY TIME:  18 minutes, 17 seconds COMPARISON:  CT abdomen and pelvis - 09/21/2017; MRCP - 06/24/2017 FINDINGS: Two spot intraoperative fluoroscopic images of the right upper abdominal quadrant during ERCP are provided for review Initial image demonstrates an ERCP probe overlying the right upper abdominal quadrant with selective cannulation opacification of the common bile duct which appears mild to moderately dilated. There is insufflation of a balloon within distal aspect of the CBD. Subsequent image demonstrates presumed sweeping of the CBD and presumed biliary sphincterotomy.  IMPRESSION: ERCP as above. These images were submitted for radiologic interpretation only. Please see the procedural report for the amount of contrast and the fluoroscopy time utilized. Electronically Signed   By: Sandi Mariscal M.D.   On: 09/21/2017 12:59   US Abdomen Limited Ruq  Result Date: 09/21/2017 CLINICAL DATA:  Hypodense liver masses on CT.  Obstructive jaundice. EXAM: ULTRASOUND ABDOMEN LIMITED RIGHT UPPER QUADRANT COMPARISON:  Abdominopelvic CT yesterday, CT 05/25/2017. FINDINGS: Gallbladder: Gallbladder is distended containing intraluminal stones and sludge. No gallbladder wall thickening, normal gallbladder wall thickness of 2 mm. No pericholecystic fluid. No sonographic Murphy sign noted by sonographer. Common bile duct: Diameter: 14 mm at the porta hepatis. No visualized choledocholithiasis. Liver: Three peripheral hypoechoic liver lesions measuring 3.4 cm, 2.8 cm, and 1.8 cm in the left and right hepatic lobe. These correspond to findings on CT. Background parenchymal echogenicity is normal. Portal vein is patent on color Doppler imaging with normal direction of blood flow towards the liver. IMPRESSION: 1. Three peripheral hypoechoic liver lesions, largest measuring 3.4 cm. These have a nonspecific sonographic appearance, MRI would provide further characterization. Given these were not seen on prior exam, metastatic disease is considered if there is history of primary malignancy. 2. Distended gallbladder with stones and sludge, no gallbladder wall thickening. Common bile duct dilatation without visualized choledocholithiasis. Electronically Signed   By: Jeb Levering M.D.   On: 09/21/2017 07:04   Labs: Basic Metabolic Panel: Recent Labs  Lab 09/22/17 0511 09/23/17 0456 09/24/17 0528 09/25/17 0441 09/26/17 0328  NA 141 142 138 139 138  K 3.8 3.6 3.4* 4.4 3.6  CL 109 107 102 104 104  CO2 22 24 25 26 26   GLUCOSE 114* 96 117* 109* 117*  BUN 16 14 13 20 20   CREATININE 1.34* 1.33*  1.21 1.41* 1.27*  CALCIUM 9.1 8.7* 9.1  9.1 8.6*  MG 1.9 1.8 1.8 1.8 1.5*   Liver Function Tests: Recent Labs  Lab 09/22/17 0511 09/23/17 0456 09/24/17 0528 09/25/17 0441 09/26/17 0328  AST 60* 72* 57* 60* 41  ALT 123* 114* 100* 90* 71*  ALKPHOS 266* 316* 313* 332* 265*  BILITOT 4.1* 3.1* 2.8* 2.8* 2.0*  PROT 6.3* 6.0* 6.6 6.2* 5.8*  ALBUMIN 3.0* 2.9* 3.2* 3.0* 2.8*   Recent Labs  Lab 09/20/17 1532  LIPASE 22   CBC: Recent Labs  Lab 09/20/17 1532  09/22/17 0511 09/23/17 0456 09/24/17 0528 09/25/17 0441 09/26/17 0328  WBC 10.8*   < > 11.7* 11.8* 14.3* 9.9 9.4  NEUTROABS 8.4*  --   --   --  11.3*  --   --   HGB 10.2*   < > 9.8* 9.3* 10.1* 10.1* 9.4*  HCT 32.2*   < > 29.3* 29.5* 31.4* 31.3* 29.9*  MCV 93.9   < > 92.4 94.6 92.9 94.8 94.9  PLT 272   < > 258 271 305 281 271   < > = values in this interval not displayed.    CBG: Recent Labs  Lab 09/25/17 0749 09/25/17 1136 09/25/17 1643 09/25/17 2116 09/26/17 0800  GLUCAP 106* 187* 185* 173* 118*    Principal Problem:   Obstructive jaundice Active Problems:   OSA (obstructive sleep apnea)   Hypodense mass of liver   DM2 (diabetes mellitus, type 2) (HCC)   HTN (hypertension)   Abdominal pain, generalized   Time coordinating discharge: 24 minutes  Signed:  Murray Hodgkins, MD [facility] 09/26/2017, 11:05 AM

## 2017-09-26 NOTE — Progress Notes (Signed)
Jerry Weiss to be D/C'd Home per MD order.  Discussed prescriptions and follow up appointments with the patient. Prescriptions given to patient, medication list explained in detail. Pt verbalized understanding.  Allergies as of 09/26/2017      Reactions   Ciprofloxacin    Codeine Other (See Comments)   Hallucinations    Shellfish-derived Products Swelling   "Eyes swell shut"      Medication List    TAKE these medications   amLODipine 10 MG tablet Commonly known as:  NORVASC Take 10 mg by mouth daily.   gabapentin 300 MG capsule Commonly known as:  NEURONTIN Take 300 mg by mouth at bedtime.   hydrALAZINE 100 MG tablet Commonly known as:  APRESOLINE Take 100 mg by mouth 3 (three) times daily.   labetalol 200 MG tablet Commonly known as:  NORMODYNE Take 400 mg by mouth 3 (three) times daily.   lisinopril 20 MG tablet Commonly known as:  PRINIVIL,ZESTRIL Take 40 mg by mouth daily.   neomycin-polymyxin-dexameth 0.1 % Oint Commonly known as:  MAXITROL Place 1 application into both eyes daily as needed (eye irritation).   ondansetron 4 MG tablet Commonly known as:  ZOFRAN Take 4 mg by mouth 2 (two) times daily as needed for nausea/vomiting.   pantoprazole 20 MG tablet Commonly known as:  PROTONIX Take 20 mg by mouth daily.   pioglitazone 15 MG tablet Commonly known as:  ACTOS Take 15 mg by mouth daily.   polyethylene glycol packet Commonly known as:  MIRALAX / GLYCOLAX Take 17 g by mouth daily as needed for mild constipation or moderate constipation.   sitaGLIPtin 100 MG tablet Commonly known as:  JANUVIA Take 100 mg by mouth daily.       Vitals:   09/26/17 0551 09/26/17 0942  BP: (!) 153/74 (!) 146/69  Pulse: 68 68  Resp: 16 18  Temp: 98.7 F (37.1 C) 98.7 F (37.1 C)  SpO2: 96% 95%    Skin clean, dry and intact without evidence of skin break down, no evidence of skin tears noted. IV catheter discontinued intact. Site without signs and symptoms of  complications. Dressing and pressure applied. Pt denies pain at this time. No complaints noted.  An After Visit Summary was printed and given to the patient. Patient escorted via [place], and D/C home via private auto.  Dixie Dials RN, BSN

## 2017-09-30 ENCOUNTER — Emergency Department (HOSPITAL_COMMUNITY): Payer: BLUE CROSS/BLUE SHIELD

## 2017-09-30 ENCOUNTER — Other Ambulatory Visit: Payer: Self-pay

## 2017-09-30 ENCOUNTER — Encounter (HOSPITAL_COMMUNITY): Payer: Self-pay | Admitting: *Deleted

## 2017-09-30 ENCOUNTER — Inpatient Hospital Stay (HOSPITAL_COMMUNITY)
Admission: EM | Admit: 2017-09-30 | Discharge: 2017-10-14 | DRG: 417 | Disposition: A | Discharge status: Home / Self Care | Payer: BLUE CROSS/BLUE SHIELD | Attending: Family Medicine | Admitting: Family Medicine

## 2017-09-30 DIAGNOSIS — C641 Malignant neoplasm of right kidney, except renal pelvis: Secondary | ICD-10-CM | POA: Diagnosis not present

## 2017-09-30 DIAGNOSIS — K810 Acute cholecystitis: Secondary | ICD-10-CM | POA: Diagnosis not present

## 2017-09-30 DIAGNOSIS — K8010 Calculus of gallbladder with chronic cholecystitis without obstruction: Secondary | ICD-10-CM | POA: Diagnosis not present

## 2017-09-30 DIAGNOSIS — K219 Gastro-esophageal reflux disease without esophagitis: Secondary | ICD-10-CM | POA: Diagnosis present

## 2017-09-30 DIAGNOSIS — K8012 Calculus of gallbladder with acute and chronic cholecystitis without obstruction: Secondary | ICD-10-CM | POA: Diagnosis not present

## 2017-09-30 DIAGNOSIS — N189 Chronic kidney disease, unspecified: Secondary | ICD-10-CM | POA: Diagnosis not present

## 2017-09-30 DIAGNOSIS — C787 Secondary malignant neoplasm of liver and intrahepatic bile duct: Secondary | ICD-10-CM | POA: Diagnosis present

## 2017-09-30 DIAGNOSIS — I10 Essential (primary) hypertension: Secondary | ICD-10-CM | POA: Diagnosis present

## 2017-09-30 DIAGNOSIS — I700 Atherosclerosis of aorta: Secondary | ICD-10-CM | POA: Diagnosis not present

## 2017-09-30 DIAGNOSIS — K439 Ventral hernia without obstruction or gangrene: Secondary | ICD-10-CM | POA: Diagnosis present

## 2017-09-30 DIAGNOSIS — Z6827 Body mass index (BMI) 27.0-27.9, adult: Secondary | ICD-10-CM

## 2017-09-30 DIAGNOSIS — Z823 Family history of stroke: Secondary | ICD-10-CM | POA: Diagnosis not present

## 2017-09-30 DIAGNOSIS — Z825 Family history of asthma and other chronic lower respiratory diseases: Secondary | ICD-10-CM

## 2017-09-30 DIAGNOSIS — K8000 Calculus of gallbladder with acute cholecystitis without obstruction: Secondary | ICD-10-CM | POA: Diagnosis present

## 2017-09-30 DIAGNOSIS — Z808 Family history of malignant neoplasm of other organs or systems: Secondary | ICD-10-CM

## 2017-09-30 DIAGNOSIS — E1122 Type 2 diabetes mellitus with diabetic chronic kidney disease: Secondary | ICD-10-CM | POA: Diagnosis present

## 2017-09-30 DIAGNOSIS — K660 Peritoneal adhesions (postprocedural) (postinfection): Secondary | ICD-10-CM | POA: Diagnosis present

## 2017-09-30 DIAGNOSIS — G4733 Obstructive sleep apnea (adult) (pediatric): Secondary | ICD-10-CM | POA: Diagnosis not present

## 2017-09-30 DIAGNOSIS — Z85528 Personal history of other malignant neoplasm of kidney: Secondary | ICD-10-CM

## 2017-09-30 DIAGNOSIS — R590 Localized enlarged lymph nodes: Secondary | ICD-10-CM | POA: Diagnosis not present

## 2017-09-30 DIAGNOSIS — D72829 Elevated white blood cell count, unspecified: Secondary | ICD-10-CM | POA: Diagnosis not present

## 2017-09-30 DIAGNOSIS — R627 Adult failure to thrive: Secondary | ICD-10-CM | POA: Diagnosis not present

## 2017-09-30 DIAGNOSIS — C259 Malignant neoplasm of pancreas, unspecified: Secondary | ICD-10-CM | POA: Diagnosis present

## 2017-09-30 DIAGNOSIS — K8309 Other cholangitis: Secondary | ICD-10-CM | POA: Diagnosis not present

## 2017-09-30 DIAGNOSIS — I7100 Dissection of unspecified site of aorta: Secondary | ICD-10-CM | POA: Diagnosis present

## 2017-09-30 DIAGNOSIS — D649 Anemia, unspecified: Secondary | ICD-10-CM | POA: Diagnosis not present

## 2017-09-30 DIAGNOSIS — R1011 Right upper quadrant pain: Secondary | ICD-10-CM | POA: Diagnosis not present

## 2017-09-30 DIAGNOSIS — E876 Hypokalemia: Secondary | ICD-10-CM | POA: Diagnosis not present

## 2017-09-30 DIAGNOSIS — Z9079 Acquired absence of other genital organ(s): Secondary | ICD-10-CM

## 2017-09-30 DIAGNOSIS — R160 Hepatomegaly, not elsewhere classified: Secondary | ICD-10-CM | POA: Diagnosis present

## 2017-09-30 DIAGNOSIS — N179 Acute kidney failure, unspecified: Secondary | ICD-10-CM | POA: Diagnosis not present

## 2017-09-30 DIAGNOSIS — I129 Hypertensive chronic kidney disease with stage 1 through stage 4 chronic kidney disease, or unspecified chronic kidney disease: Secondary | ICD-10-CM | POA: Diagnosis not present

## 2017-09-30 DIAGNOSIS — K5900 Constipation, unspecified: Secondary | ICD-10-CM | POA: Diagnosis not present

## 2017-09-30 DIAGNOSIS — K420 Umbilical hernia with obstruction, without gangrene: Secondary | ICD-10-CM | POA: Diagnosis not present

## 2017-09-30 DIAGNOSIS — E119 Type 2 diabetes mellitus without complications: Secondary | ICD-10-CM

## 2017-09-30 DIAGNOSIS — Z833 Family history of diabetes mellitus: Secondary | ICD-10-CM

## 2017-09-30 DIAGNOSIS — R945 Abnormal results of liver function studies: Secondary | ICD-10-CM | POA: Diagnosis not present

## 2017-09-30 DIAGNOSIS — Z905 Acquired absence of kidney: Secondary | ICD-10-CM | POA: Diagnosis not present

## 2017-09-30 DIAGNOSIS — K8067 Calculus of gallbladder and bile duct with acute and chronic cholecystitis with obstruction: Secondary | ICD-10-CM | POA: Diagnosis not present

## 2017-09-30 DIAGNOSIS — C772 Secondary and unspecified malignant neoplasm of intra-abdominal lymph nodes: Secondary | ICD-10-CM | POA: Diagnosis not present

## 2017-09-30 DIAGNOSIS — K819 Cholecystitis, unspecified: Secondary | ICD-10-CM

## 2017-09-30 DIAGNOSIS — C801 Malignant (primary) neoplasm, unspecified: Secondary | ICD-10-CM | POA: Diagnosis not present

## 2017-09-30 DIAGNOSIS — K760 Fatty (change of) liver, not elsewhere classified: Secondary | ICD-10-CM | POA: Diagnosis not present

## 2017-09-30 DIAGNOSIS — R933 Abnormal findings on diagnostic imaging of other parts of digestive tract: Secondary | ICD-10-CM | POA: Diagnosis not present

## 2017-09-30 DIAGNOSIS — I712 Thoracic aortic aneurysm, without rupture: Secondary | ICD-10-CM | POA: Diagnosis not present

## 2017-09-30 DIAGNOSIS — Z881 Allergy status to other antibiotic agents status: Secondary | ICD-10-CM

## 2017-09-30 DIAGNOSIS — Z885 Allergy status to narcotic agent status: Secondary | ICD-10-CM

## 2017-09-30 DIAGNOSIS — K8020 Calculus of gallbladder without cholecystitis without obstruction: Secondary | ICD-10-CM | POA: Diagnosis not present

## 2017-09-30 DIAGNOSIS — Z91013 Allergy to seafood: Secondary | ICD-10-CM

## 2017-09-30 LAB — CBC WITH DIFFERENTIAL/PLATELET
Abs Immature Granulocytes: 0.1 10*3/uL (ref 0.0–0.1)
Basophils Absolute: 0 10*3/uL (ref 0.0–0.1)
Basophils Relative: 0 %
EOS ABS: 0.1 10*3/uL (ref 0.0–0.7)
EOS PCT: 1 %
HEMATOCRIT: 35.7 % — AB (ref 39.0–52.0)
HEMOGLOBIN: 11.3 g/dL — AB (ref 13.0–17.0)
Immature Granulocytes: 1 %
LYMPHS ABS: 1.2 10*3/uL (ref 0.7–4.0)
LYMPHS PCT: 6 %
MCH: 29.8 pg (ref 26.0–34.0)
MCHC: 31.7 g/dL (ref 30.0–36.0)
MCV: 94.2 fL (ref 78.0–100.0)
MONO ABS: 1.1 10*3/uL — AB (ref 0.1–1.0)
MONOS PCT: 6 %
Neutro Abs: 16.6 10*3/uL — ABNORMAL HIGH (ref 1.7–7.7)
Neutrophils Relative %: 86 %
Platelets: 313 10*3/uL (ref 150–400)
RBC: 3.79 MIL/uL — AB (ref 4.22–5.81)
RDW: 15 % (ref 11.5–15.5)
WBC: 19.2 10*3/uL — AB (ref 4.0–10.5)

## 2017-09-30 LAB — URINALYSIS, ROUTINE W REFLEX MICROSCOPIC
BILIRUBIN URINE: NEGATIVE
Bacteria, UA: NONE SEEN
GLUCOSE, UA: NEGATIVE mg/dL
KETONES UR: NEGATIVE mg/dL
NITRITE: NEGATIVE
PH: 5 (ref 5.0–8.0)
PROTEIN: 30 mg/dL — AB
Specific Gravity, Urine: 1.016 (ref 1.005–1.030)

## 2017-09-30 LAB — COMPREHENSIVE METABOLIC PANEL
ALBUMIN: 3.4 g/dL — AB (ref 3.5–5.0)
ALK PHOS: 335 U/L — AB (ref 38–126)
ALT: 51 U/L — AB (ref 0–44)
AST: 37 U/L (ref 15–41)
Anion gap: 11 (ref 5–15)
BUN: 12 mg/dL (ref 8–23)
CALCIUM: 9.5 mg/dL (ref 8.9–10.3)
CO2: 24 mmol/L (ref 22–32)
CREATININE: 1.24 mg/dL (ref 0.61–1.24)
Chloride: 105 mmol/L (ref 98–111)
GFR calc non Af Amer: >60 mL/min (ref >60)
GLUCOSE: 132 mg/dL — AB (ref 70–99)
Potassium: 4.3 mmol/L (ref 3.5–5.1)
SODIUM: 140 mmol/L (ref 135–145)
Total Bilirubin: 1.8 mg/dL — ABNORMAL HIGH (ref 0.3–1.2)
Total Protein: 7 g/dL (ref 6.5–8.1)

## 2017-09-30 LAB — LIPASE, BLOOD: LIPASE: 21 U/L (ref 11–51)

## 2017-09-30 LAB — I-STAT TROPONIN, ED: Troponin i, poc: 0 ng/mL (ref 0.00–0.08)

## 2017-09-30 MED ORDER — PIPERACILLIN-TAZOBACTAM 3.375 G IVPB
3.3750 g | Freq: Three times a day (TID) | INTRAVENOUS | Status: DC
  Administered 2017-10-01 – 2017-10-03 (×7): 3.375 g via INTRAVENOUS
  Filled 2017-09-30 (×9): qty 50

## 2017-09-30 MED ORDER — PIPERACILLIN-TAZOBACTAM 3.375 G IVPB 30 MIN
3.3750 g | Freq: Once | INTRAVENOUS | Status: AC
  Administered 2017-09-30: 3.375 g via INTRAVENOUS
  Filled 2017-09-30: qty 50

## 2017-09-30 MED ORDER — ONDANSETRON HCL 4 MG PO TABS
4.0000 mg | ORAL_TABLET | Freq: Four times a day (QID) | ORAL | Status: DC | PRN
  Administered 2017-10-05 – 2017-10-14 (×5): 4 mg via ORAL
  Filled 2017-09-30 (×6): qty 1

## 2017-09-30 MED ORDER — INSULIN ASPART 100 UNIT/ML ~~LOC~~ SOLN
0.0000 [IU] | SUBCUTANEOUS | Status: DC
  Administered 2017-10-01: 2 [IU] via SUBCUTANEOUS
  Administered 2017-10-01 (×2): 1 [IU] via SUBCUTANEOUS
  Administered 2017-10-02 (×2): 2 [IU] via SUBCUTANEOUS
  Administered 2017-10-03: 1 [IU] via SUBCUTANEOUS
  Administered 2017-10-03 – 2017-10-04 (×4): 2 [IU] via SUBCUTANEOUS
  Administered 2017-10-04: 1 [IU] via SUBCUTANEOUS

## 2017-09-30 MED ORDER — ONDANSETRON HCL 4 MG/2ML IJ SOLN
4.0000 mg | Freq: Once | INTRAMUSCULAR | Status: AC
  Administered 2017-09-30: 4 mg via INTRAVENOUS
  Filled 2017-09-30: qty 2

## 2017-09-30 MED ORDER — MORPHINE SULFATE (PF) 4 MG/ML IV SOLN
4.0000 mg | Freq: Once | INTRAVENOUS | Status: AC
  Administered 2017-09-30: 4 mg via INTRAVENOUS
  Filled 2017-09-30: qty 1

## 2017-09-30 MED ORDER — SODIUM CHLORIDE 0.9 % IV SOLN
INTRAVENOUS | Status: AC
  Administered 2017-10-01: 01:00:00 via INTRAVENOUS

## 2017-09-30 MED ORDER — FAMOTIDINE IN NACL 20-0.9 MG/50ML-% IV SOLN
20.0000 mg | Freq: Two times a day (BID) | INTRAVENOUS | Status: DC
  Administered 2017-10-01 – 2017-10-04 (×8): 20 mg via INTRAVENOUS
  Filled 2017-09-30 (×8): qty 50

## 2017-09-30 MED ORDER — LABETALOL HCL 5 MG/ML IV SOLN
10.0000 mg | Freq: Once | INTRAVENOUS | Status: AC
  Administered 2017-09-30: 10 mg via INTRAVENOUS
  Filled 2017-09-30: qty 4

## 2017-09-30 MED ORDER — IOPAMIDOL (ISOVUE-370) INJECTION 76%
INTRAVENOUS | Status: AC
  Filled 2017-09-30: qty 100

## 2017-09-30 MED ORDER — SODIUM CHLORIDE 0.9 % IV BOLUS
1000.0000 mL | Freq: Once | INTRAVENOUS | Status: AC
  Administered 2017-09-30: 1000 mL via INTRAVENOUS

## 2017-09-30 MED ORDER — ONDANSETRON HCL 4 MG/2ML IJ SOLN
4.0000 mg | Freq: Four times a day (QID) | INTRAMUSCULAR | Status: DC | PRN
  Administered 2017-10-01 – 2017-10-13 (×21): 4 mg via INTRAVENOUS
  Filled 2017-09-30 (×21): qty 2

## 2017-09-30 MED ORDER — IOPAMIDOL (ISOVUE-370) INJECTION 76%
100.0000 mL | Freq: Once | INTRAVENOUS | Status: AC | PRN
  Administered 2017-09-30: 100 mL via INTRAVENOUS

## 2017-09-30 MED ORDER — ACETAMINOPHEN 650 MG RE SUPP: 650.0000 mg | Freq: Four times a day (QID) | RECTAL | Status: DC | PRN

## 2017-09-30 MED ORDER — MORPHINE SULFATE (PF) 4 MG/ML IV SOLN
2.0000 mg | INTRAVENOUS | Status: DC | PRN
  Administered 2017-10-01 (×2): 4 mg via INTRAVENOUS
  Filled 2017-09-30 (×2): qty 1

## 2017-09-30 MED ORDER — HYDRALAZINE HCL 20 MG/ML IJ SOLN
10.0000 mg | Freq: Once | INTRAMUSCULAR | Status: AC
  Administered 2017-09-30: 10 mg via INTRAVENOUS
  Filled 2017-09-30: qty 1

## 2017-09-30 MED ORDER — ACETAMINOPHEN 325 MG PO TABS
650.0000 mg | ORAL_TABLET | Freq: Four times a day (QID) | ORAL | Status: DC | PRN
  Administered 2017-10-04 – 2017-10-06 (×6): 650 mg via ORAL
  Filled 2017-09-30 (×6): qty 2

## 2017-09-30 MED ORDER — HYDRALAZINE HCL 20 MG/ML IJ SOLN: 10.0000 mg | INTRAMUSCULAR | Status: DC | PRN

## 2017-09-30 NOTE — Progress Notes (Signed)
Pharmacy Antibiotic Note  Kimsey Demaree is a 62 y.o. male admitted on 09/30/2017 with intra-abdominal infection.  Pharmacy has been consulted for Zosyn dosing. WBC up, was WNL 4 days ago. CrCl ~55-60.   Plan: Zosyn 3.375G IV q8h to be infused over 4 hours Trend WBC, temp, renal function  F/U infectious work-up  Height: 5' 7"  (170.2 cm) Weight: 174 lb (78.9 kg) IBW/kg (Calculated) : 66.1  Temp (24hrs), Avg:99 F (37.2 C), Min:99 F (37.2 C), Max:99 F (37.2 C)  Recent Labs  Lab 09/24/17 0528 09/25/17 0441 09/26/17 0328 09/30/17 1715 09/30/17 1949  WBC 14.3* 9.9 9.4  --  19.2*  CREATININE 1.21 1.41* 1.27* 1.24  --     Estimated Creatinine Clearance: 57.7 mL/min (by C-G formula based on SCr of 1.24 mg/dL).    Allergies  Allergen Reactions  . Ciprofloxacin Other (See Comments)    Unknown  . Codeine Other (See Comments)    Hallucinations   . Shellfish-Derived Products Swelling    "Eyes swell shut"     Narda Bonds 09/30/2017 11:33 PM

## 2017-09-30 NOTE — ED Notes (Signed)
Per lab, CBC canceled. New CBC order placed.

## 2017-09-30 NOTE — ED Provider Notes (Signed)
MSE was initiated and I personally evaluated the patient and placed orders (if any) at  5:14 PM on September 30, 2017.  The patient appears stable so that the remainder of the MSE may be completed by another provider.  Patient placed in Quick Look pathway, seen and evaluated   Chief Complaint: abdominal pain  HPI:   Patient presents to ED for abdominal pain that began today.  States that it starts in his sternum and radiates down to his suprapubic area.  Located more so on his right upper quadrant.  Several episodes of nonbloody, nonbilious emesis and nausea that began today.  Reports some loose bowel movements mixed in with normal bowel movements.  Patient recently discharged from hospital on 7/15 after completing liver biopsy for obstructive jaundice.  ROS: Abdominal pain  Physical Exam:   Gen: No distress  Neuro: Awake and Alert  Skin: Warm    Focused Exam: Tenderness to palpation of the right upper quadrant.  Patient appears dehydrated.  NAD.  ESI 2, will obtain troponin, EKG, CXR for chest pain since patient reports pain begins in sternum.   Initiation of care has begun. The patient has been counseled on the process, plan, and necessity for staying for the completion/evaluation, and the remainder of the medical screening examination    Delia Heady, PA-C 09/30/17 1716    Tegeler, Gwenyth Allegra, MD 10/01/17 [phone]

## 2017-09-30 NOTE — ED Provider Notes (Addendum)
[facility] EMERGENCY DEPARTMENT Provider Note   CSN: [id number] Arrival date & time: 09/30/17  1638     History   Chief Complaint Chief Complaint  Patient presents with  . Abdominal Pain    HPI Jerry Weiss is a 62 y.o. male previous type B dissection, diabetes, recent admission for biliary obstruction status post stent, here presenting with right upper quadrant pain, abdominal pain, chills.  Patient states that he just had a biliary stent placed was discharged from the hospital about 5 days ago.  Patient states that he had acute onset of right upper quadrant pain that started today.  Some chills and subjective fever as well.  Had several episodes of nonbilious and nonbloody vomiting as well. Patient was noted to be hypertensive in triage. He states that he had history of type B dissection and wasn't able to tolerate his blood pressure medicines today. Has some chest pressure as well. Denies numbness or weakness to his arms or legs.   The history is provided by the patient.    Past Medical History:  Diagnosis Date  . Anemia    mild  . 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 Diabetes mellitus without complication ([place])    type2  . GERD (gastroesophageal reflux disease)   . Hypertension   . Inguinal hernia    Bilateral age 89  . Leg muscle spasm    both legs occ  . OSA (obstructive sleep apnea)    Wears CPAP at night  . Pneumonia    history of x3  . Renal cancer, right ([place])   . 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 Renal mass, right   . Status post dilation of esophageal narrowing   . Umbilical hernia    Small noted on CT 05/26/2017  . Ventral hernia    noted on CT 05/26/2017    Patient Active Problem List   Diagnosis Date Noted  . Acute calculous cholecystitis 09/30/2017  . Acute cholangitis 09/30/2017  . Abdominal pain, generalized  09/25/2017  . Obstructive jaundice 09/20/2017  . Hypodense mass of liver 09/20/2017  . DM2 (diabetes mellitus, type 2) ([place]) 09/20/2017  . HTN (hypertension) 09/20/2017  . Urinary retention due to benign prostatic hyperplasia 09/08/2017  . Renal neoplasm 07/28/2017  . Aortic dissection ([place]) 03/06/2017  . OSA (obstructive sleep apnea) 07/15/2010    Past Surgical History:  Procedure Laterality Date  . BILIARY STENT PLACEMENT  09/21/2017   Procedure: BILIARY STENT PLACEMENT;  Surgeon: Clarene Essex, MD;  Location: [facility] ENDOSCOPY;  Service: Endoscopy;;  . BILIARY STENT PLACEMENT  09/23/2017   Procedure: BILIARY STENT PLACEMENT;  Surgeon: Clarene Essex, MD;  Location: [place];  Service: Endoscopy;;  . BRONCHIAL BRUSHINGS  09/21/2017   Procedure: BILIARY BRUSHINGS;  Surgeon: Clarene Essex, MD;  Location: [place];  Service: Endoscopy;;  . COLONOSCOPY    . ERCP N/A 09/21/2017   Procedure: ENDOSCOPIC RETROGRADE CHOLANGIOPANCREATOGRAPHY (ERCP);  Surgeon: Clarene Essex, MD;  Location: [place];  Service: Endoscopy;  Laterality: N/A;  . ERCP N/A 09/23/2017   Procedure: ENDOSCOPIC RETROGRADE CHOLANGIOPANCREATOGRAPHY (ERCP);  Surgeon: Clarene Essex, MD;  Location: [place];  Service: Endoscopy;  Laterality: N/A;  . HERNIA REPAIR Bilateral    AGE 50  . ROBOT ASSISTED LAPAROSCOPIC NEPHRECTOMY Right 07/28/2017   Procedure: XI ROBOTIC ASSISTED LAPAROSCOPIC PARTIAL NEPHRECTOMY;  Surgeon: Raynelle Bring, MD;  Location:  [facility];  Service: Urology;  Laterality: Right;  . SPHINCTEROTOMY  09/21/2017   Procedure: SPHINCTEROTOMY;  Surgeon: Clarene Essex, MD;  Location: [facility] ENDOSCOPY;  Service: Endoscopy;;  . Bess Kinds CHOLANGIOSCOPY N/A 09/23/2017   Procedure: GXQJJHER CHOLANGIOSCOPY;  Surgeon: Clarene Essex, MD;  Location: [place];  Service: Endoscopy;  Laterality: N/A;  . TONSILLECTOMY     age 3  . TRANSURETHRAL RESECTION OF PROSTATE    . TRANSURETHRAL RESECTION OF PROSTATE N/A 09/08/2017   Procedure:  TRANSURETHRAL RESECTION OF THE PROSTATE (TURP);  Surgeon: Irine Seal, MD;  Location: [facility];  Service: Urology;  Laterality: N/A;  . UPPER GASTROINTESTINAL ENDOSCOPY          Home Medications    Prior to Admission medications   Medication Sig Start Date End Date Taking? Authorizing Provider  amLODipine (NORVASC) 10 MG tablet Take 10 mg by mouth daily.   Yes [provider]  gabapentin (NEURONTIN) 300 MG capsule Take 300 mg by mouth at bedtime.   Yes [provider]  hydrALAZINE (APRESOLINE) 100 MG tablet Take 100 mg by mouth 3 (three) times daily.   Yes [provider]  labetalol (NORMODYNE) 200 MG tablet Take 400 mg by mouth 3 (three) times daily.    Yes [provider]  lisinopril (PRINIVIL,ZESTRIL) 20 MG tablet Take 40 mg by mouth daily.    Yes [provider]  neomycin-polymyxin-dexameth (MAXITROL) 0.1 % OINT Place 1 application into both eyes daily as needed (eye irritation).   Yes [provider]  ondansetron (ZOFRAN) 4 MG tablet Take 4 mg by mouth 2 (two) times daily as needed for nausea/vomiting. 09/16/17  Yes [provider]  pantoprazole (PROTONIX) 20 MG tablet Take 20 mg by mouth daily.  07/11/16  Yes [provider]  pioglitazone (ACTOS) 15 MG tablet Take 15 mg by mouth daily.   Yes [provider]  polyethylene glycol (MIRALAX / GLYCOLAX) packet Take 17 g by mouth daily as needed for mild constipation or moderate constipation. 09/26/17  Yes Samuella Cota, MD  sitaGLIPtin (JANUVIA) 100 MG tablet Take 100 mg by mouth daily.   Yes [provider]    Family History Family History  Problem Relation Age of Onset  . Allergies Mother   . Diabetes Mother   . Stroke Mother   . Skin cancer Mother   . Allergies Sister   . Asthma Daughter        childhood  . Asthma Father        developed a lot of medical problems after a fall  . Colon cancer Neg Hx   . Stomach cancer Neg Hx   . Rectal  cancer Neg Hx   . Liver cancer Neg Hx   . Esophageal cancer Neg Hx     Social History Social History   Tobacco Use  . Smoking status: Never Smoker  . Smokeless tobacco: Never Used  Substance Use Topics  . Alcohol use: No  . Drug use: No     Allergies   Ciprofloxacin; Codeine; and Shellfish-derived products   Review of Systems Review of Systems  Gastrointestinal: Positive for abdominal pain.  All other systems reviewed and are negative.    Physical Exam Updated Vital Signs BP (!) 157/78   Pulse 85   Temp 99 F (37.2 C) (Oral)   Resp 20   Ht 5' 7"  (1.702 m)   Wt 78.9 kg (174 lb)   SpO2 93%   BMI 27.25 kg/m   Physical  Exam  Constitutional: He is oriented to person, place, and time.  Uncomfortable, chronically ill   HENT:  Head: Normocephalic.  MM dry   Eyes: Pupils are equal, round, and reactive to light. EOM are normal.  Cardiovascular: Normal rate, regular rhythm and normal heart sounds.  Pulmonary/Chest: Effort normal and breath sounds normal.  Abdominal: Bowel sounds are normal. There is tenderness in the right upper quadrant.  + RUQ and epigastric tenderness, + rebound and guarding   Neurological: He is alert and oriented to person, place, and time.  Skin: Skin is warm. Capillary refill takes less than 2 seconds.  Psychiatric: He has a normal mood and affect. His behavior is normal.  Nursing note and vitals reviewed.    ED Treatments / Results  Labs (all labs ordered are listed, but only abnormal results are displayed) Labs Reviewed  COMPREHENSIVE METABOLIC PANEL - Abnormal; Notable for the following components:      Result Value   Glucose, Bld 132 (*)    Albumin 3.4 (*)    ALT 51 (*)    Alkaline Phosphatase 335 (*)    Total Bilirubin 1.8 (*)    All other components within normal limits  URINALYSIS, ROUTINE W REFLEX MICROSCOPIC - Abnormal; Notable for the following components:   Hgb urine dipstick SMALL (*)    Protein, ur 30 (*)     Leukocytes, UA SMALL (*)    All other components within normal limits  CBC WITH DIFFERENTIAL/PLATELET - Abnormal; Notable for the following components:   WBC 19.2 (*)    RBC 3.79 (*)    Hemoglobin 11.3 (*)    HCT 35.7 (*)    Neutro Abs 16.6 (*)    Monocytes Absolute 1.1 (*)    All other components within normal limits  CULTURE, BLOOD (ROUTINE X 2)  CULTURE, BLOOD (ROUTINE X 2)  LIPASE, BLOOD  CBC WITH DIFFERENTIAL/PLATELET  I-STAT TROPONIN, ED    EKG EKG Interpretation  Date/Time:  Friday September 30 2017 19:37:30 EDT Ventricular Rate:  74 PR Interval:    QRS Duration: 101 QT Interval:  385 QTC Calculation: 428 R Axis:   -17 Text Interpretation:  Sinus rhythm Borderline left axis deviation Abnormal R-wave progression, late transition No significant change since last tracing Confirmed by Wandra Arthurs [phone]) on 09/30/2017 8:14:43 PM   Radiology Dg Chest 2 View  Result Date: 09/30/2017 CLINICAL DATA:  Epigastric pain.  Known thoracic aneurysm. EXAM: CHEST - 2 VIEW COMPARISON:  CT chest 05/25/2017. FINDINGS: Normal heart size. Clear lung fields. No acute bony abnormality. No effusion or pneumothorax. Thoracic aortic enlargement, consistent with known dissection. IMPRESSION: No active cardiopulmonary disease. No apparent interval aortic enlargement or LEFT effusion. Electronically Signed   By: Staci Righter M.D.   On: 09/30/2017 17:59   Ct Angio Chest/abd/pel For Dissection W And/or Wo Contrast  Result Date: 09/30/2017 CLINICAL DATA:  62 year old male with back pain and abdominal pain. Nausea vomiting. Concern for aortic dissection. Known type B dissection. Recent ultrasound-guided biopsy of right lobe of the liver. EXAM: CT ANGIOGRAPHY CHEST, ABDOMEN AND PELVIS TECHNIQUE: Multidetector CT imaging through the chest, abdomen and pelvis was performed using the standard protocol during bolus administration of intravenous contrast. Multiplanar reconstructed images and MIPs were obtained and  reviewed to evaluate the vascular anatomy. CONTRAST:  178m ISOVUE-370 IOPAMIDOL (ISOVUE-370) INJECTION 76% COMPARISON:  CT of the abdomen pelvis dated 09/20/2017 and 05/25/2017 FINDINGS: CTA CHEST FINDINGS Cardiovascular: There is no cardiomegaly. Trace pericardial effusion similar to prior  CT. Dissection flap origin ating in the aortic arch distal to the origin of the left subclavian artery consistent with known type B dissection and similar to prior CT. No aneurysmal dilatation. No change in the caliber of the aorta since the prior CT. No periaortic stranding or extravasation of contrast. The origins of the great vessels of the aortic arch are patent. There is no CT evidence of pulmonary embolism. Mediastinum/Nodes: No hilar or mediastinal adenopathy. The esophagus is grossly unremarkable. No mediastinal fluid collection or hematoma. Mild heterogeneity of the left lobe of the thyroid gland with probable small nodules. Ultrasound may provide better evaluation. Lungs/Pleura: There is a trace left pleural effusion. Minimal bibasilar dependent atelectasis as well as linear atelectasis/scarring at the right lung base. No focal consolidation, or pneumothorax. The central airways are patent. Musculoskeletal: No chest wall abnormality. No acute or significant osseous findings. Degenerative changes of the spine. Review of the MIP images confirms the above findings. CTA ABDOMEN AND PELVIS FINDINGS VASCULAR Aorta: Dissection flap extends along the entire length of the abdominal aorta. No aneurysmal dilatation. No change in the caliber of the aorta since the prior CT. No periaortic stranding or extravasation of contrast. Celiac: There is extension of the dissection flap into the celiac artery similar to prior CT. No change in the caliber of the celiac artery. The celiac artery and its branches remain patent. There is a replaced left hepatic artery from the left gastric artery. SMA: The SMA is patent and arises from the true  lumen. There may be extension of the dissection flap to the origin of the SMA. Renals: The right renal artery is patent and arises from the true lumen. The left renal artery arises from the false lumen. The left renal artery is occluded similar to prior CT. IMA: The IMA is patent. The dissection flap extends to the origin of the IMA similar to prior CT. Inflow: Dissection extends to the proximal left common iliac artery similar to prior CT and terminates there. The iliac arteries are patent bilaterally. Veins: No obvious venous abnormality within the limitations of this arterial phase study. Review of the MIP images confirms the above findings. NON-VASCULAR There is no intra-abdominal free air.  Trace subhepatic free fluid. Hepatobiliary: Multiple hepatic hypodense lesions suspicious for metastatic disease. These lesions measure up to 2.7 cm in segment IV B. there has been interval placement of a biliary stent with associated pneumobilia. Evaluation for stent patency is limited on the CT. Some soft tissue density noted within the mid to distal lumen of the stent. The gallbladder is distended and contains multiple calcified stones. High attenuating content within the gallbladder likely vicarious excretion of contrast from recent procedure. There is mild diffuse gallbladder wall thickening and stranding which may be reactive. Clinical correlation is recommended. Pancreas: Ill-defined soft tissue density in the uncinate process of the pancreas is not well characterized. There is atrophy of the body and tail of the pancreas. No ductal dilatation. Spleen: Unremarkable. Adrenals/Urinary Tract: There is a 14 mm left adrenal nodule, indeterminate. The right adrenal gland is unremarkable. Infarcted and atrophic left kidney. Postsurgical changes related to partial right nephrectomy. There is no hydronephrosis on the right. The visualized ureters and urinary bladder appear unremarkable. Stomach/Bowel: Moderate stool throughout  the colon. No bowel dilatation or evidence of obstruction. Lymphatic: Multiple peripancreatic, portacaval enlarged lymph nodes. Multiple mildly rounded retroperitoneal and para-aortic lymph nodes. Reproductive: Mildly enlarged prostate gland measures approximately 5 cm in transverse diameter. Other: Anterior lower abdominal fat  containing hernia similar prior CT. No inflammatory changes or fluid collection. Musculoskeletal: No acute or significant osseous findings. Review of the MIP images confirms the above findings. IMPRESSION: 1. Similar appearance of known type B dissection as the prior CT of 05/25/2017. No periaortic stranding, extraluminal contrast, or change in the aortic aortic caliber. Extension of the dissection flap into the celiac axis, origins of the SMA and IMA and occlusion of the left renal artery as seen previously. 2. Interval placement of a biliary stent. Apparent soft tissue density in the mid to distal lumen of the stent noted however, there is pneumobilia suggestive of stent patency. 3. Distended gallbladder with multiple stones and vicariously excreted contrast from prior imaging studies. There is mild thickened appearance of the gallbladder wall which may be reactive. Acute cholecystitis is not excluded. Clinical correlation is recommended. 4. Multiple hepatic metastatic disease. Recent biopsy of liver lesion. No large hematoma. 5. Ill-defined soft tissue in the uncinate process of the pancreas with atrophy of the distal gland. 6. Infarcted and atrophic left kidney and postsurgical changes of partial right nephrectomy. 7. Retroperitoneal, and periportal adenopathy. Electronically Signed   By: Anner Crete M.D.   On: 09/30/2017 21:37   US Abdomen Limited Ruq  Result Date: 09/30/2017 CLINICAL DATA:  Right upper quadrant abdominal pain. Status post right nephrectomy for renal cancer on 07/28/2017. Nausea, vomiting and obstructive jaundice. EXAM: ULTRASOUND ABDOMEN LIMITED RIGHT UPPER  QUADRANT COMPARISON:  Abdomen and pelvis CT dated 09/20/2017 and abdomen MR dated 06/24/2017. FINDINGS: Gallbladder: Diffuse gallbladder wall thickening with a maximum thickness of 4.5 mm. Sludge in the gallbladder. There is also a 1.2 cm shadowing stone in the gallbladder. There is a 2nd similar sized stone in the gallbladder on the recent CT. Minimal pericholecystic fluid. There was a positive sonographic Murphy sign. Common bile duct: Diameter: 3.8 mm Liver: Multiple masses of varying size and echogenicity. The largest measures 3.6 cm in maximum diameter. Normal liver echotexture. There is also evidence of pneumobilia in the left lobe of the liver. Portal vein is patent on color Doppler imaging with normal direction of blood flow towards the liver. IMPRESSION: 1. Cholelithiasis, sludge in the gallbladder, diffuse gallbladder wall thickening, minimal pericholecystic fluid and positive sonographic Murphy sign. This combination of findings is compatible with a diagnosis of acute cholecystitis. 2. Left lobe liver pneumobilia. 3. Multiple liver metastases. Electronically Signed   By: Claudie Revering M.D.   On: 09/30/2017 19:20    Procedures Procedures (including critical care time)  CRITICAL CARE Performed by: Wandra Arthurs   Total critical care time: 30 minutes  Critical care time was exclusive of separately billable procedures and treating other patients.  Critical care was necessary to treat or prevent imminent or life-threatening deterioration.  Critical care was time spent personally by me on the following activities: development of treatment plan with patient and/or surrogate as well as nursing, discussions with consultants, evaluation of patient's response to treatment, examination of patient, obtaining history from patient or surrogate, ordering and performing treatments and interventions, ordering and review of laboratory studies, ordering and review of radiographic studies, pulse oximetry and  re-evaluation of patient's condition.   Medications Ordered in ED Medications  iopamidol (ISOVUE-370) 76 % injection (has no administration in time range)  sodium chloride 0.9 % bolus 1,000 mL (0 mLs Intravenous Stopped 09/30/17 2213)  ondansetron (ZOFRAN) injection 4 mg (4 mg Intravenous Given 09/30/17 2009)  morphine 4 MG/ML injection 4 mg (4 mg Intravenous Given 09/30/17 2009)  hydrALAZINE (APRESOLINE) injection 10 mg (10 mg Intravenous Given 09/30/17 2031)  labetalol (NORMODYNE,TRANDATE) injection 10 mg (10 mg Intravenous Given 09/30/17 2029)  iopamidol (ISOVUE-370) 76 % injection 100 mL (100 mLs Intravenous Contrast Given 09/30/17 2045)  morphine 4 MG/ML injection 4 mg (4 mg Intravenous Given 09/30/17 2110)  piperacillin-tazobactam (ZOSYN) IVPB 3.375 g (0 g Intravenous Stopped 09/30/17 2240)     Initial Impression / Assessment and Plan / ED Course  I have reviewed the triage vital signs and the nursing notes.  Pertinent labs & imaging results that were available during my care of the patient were reviewed by me and considered in my medical decision making (see chart for details).    Jerry Weiss is a 62 y.o. male here with abdominal pain, vomiting. Low grade temp 99 in the ED. Hypertensive to 180s. Patient appears very uncomfortable and has hx of dissection. Concerned for recurrent dissection vs biliary colic. Will get labs, CTA dissection, RUQ [place].   11:00 PM RUQ showed acute cholecystitis. I called Dr. Grandville Silos from surgery. He states that since patient had biliary stent recently and pneumobilia on CT and [place] currently, consider ascending cholangitis. WBC 19, LFTs similar to prior. He recommend GI eval and if GI still needs surgery to consult after seeing the patient, they can consult in AM. Will readmit to medicine service. Ordered zosyn. I talked to Dr. Paulita Fujita from GI who will see patient tomorrow. Hospitalist to admit.    Final Clinical Impressions(s) / ED Diagnoses   Final diagnoses:    RUQ pain  Cholecystitis  Cholangitis    ED Discharge Orders    None       Drenda Freeze, MD 09/30/17 2239    Drenda Freeze, MD 09/30/17 2300

## 2017-09-30 NOTE — H&P (Signed)
History and Physical    Jerry Weiss YQM:[id number] DOB: 1955/05/16 DOA: 09/30/2017  PCP: [name], MD   Patient coming from: Home   Chief Complaint: Abdominal pain, back pain, N/V   HPI: Jerry Weiss is a 62 y.o. male with medical history significant for hypertension, type 2 diabetes mellitus, type B aortic dissection, RCC status-post partial nephrectomy, and admission earlier this month for obstructive jaundice treated with CBD stent, now presenting to the ED with abdominal pain, nausea, and non-bloody vomiting.  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  He was admitted to the hospital 10 days ago with obstructive jaundice, had biliary stent placed, had a liver mass biopsied by IR with pathology not yet back, and was discharged home for outpatient follow-up.  He had been doing fairly well back at home until developing back pain yesterday and then severe abdominal pain today with nausea and nonbloody vomiting. No fever or diarrhea reported.   ED Course: Upon arrival to the ED, patient is found to be afebrile, saturating well on room air, and with vitals otherwise normal.  EKG features a sinus rhythm, chest x-ray is negative for acute cardiopulmonary disease, and CTA dissection study is reassuring with similar appearance of his known type B dissection, but also notable for ill-defined pancreatic uncinate and retroperitoneal and periportal adenopathy.  Right upper quadrant ultrasound reveals cholelithiasis with sludge in the gallbladder, diffuse wall thickening, pericholecystic fluid, and positive Murphy sign concerning for acute cholecystitis.  Multiple liver mets are also noted on the ultrasound.  Patient was treated with hydralazine, labetalol, morphine, 1 L of normal saline, and Zosyn in the ED.  Gastroenterology and surgery were consulted by the ED physician and  a medical admission was recommended.  Patient remains hemodynamically stable and will be admitted for ongoing evaluation and management of acute calculus cholecystitis.   Review of Systems:  All other systems reviewed and apart from HPI, are negative.  Past Medical History:  Diagnosis Date  . Anemia    mild  . 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 Diabetes mellitus without complication ([place])    type2  . GERD (gastroesophageal reflux disease)   . Hypertension   . Inguinal hernia    Bilateral age 71  . Leg muscle spasm    both legs occ  . OSA (obstructive sleep apnea)    Wears CPAP at night  . Pneumonia    history of x3  . Renal cancer, right ([place])   . 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 Renal mass, right   . Status post dilation of esophageal narrowing   . Umbilical hernia    Small noted on CT 05/26/2017  . Ventral hernia    noted on CT 05/26/2017    Past Surgical History:  Procedure Laterality Date  . BILIARY STENT PLACEMENT  09/21/2017   Procedure: BILIARY STENT PLACEMENT;  Surgeon: Clarene Essex, MD;  Location: [facility] ENDOSCOPY;  Service: Endoscopy;;  . BILIARY STENT PLACEMENT  09/23/2017   Procedure: BILIARY STENT PLACEMENT;  Surgeon: Clarene Essex, MD;  Location: [place];  Service: Endoscopy;;  . BRONCHIAL BRUSHINGS  09/21/2017   Procedure: BILIARY BRUSHINGS;  Surgeon: Clarene Essex, MD;  Location: [place];  Service: Endoscopy;;  .  COLONOSCOPY    . ERCP N/A 09/21/2017   Procedure: ENDOSCOPIC RETROGRADE CHOLANGIOPANCREATOGRAPHY (ERCP);  Surgeon: Clarene Essex, MD;  Location: [place];  Service: Endoscopy;  Laterality: N/A;  . ERCP N/A 09/23/2017   Procedure: ENDOSCOPIC RETROGRADE CHOLANGIOPANCREATOGRAPHY (ERCP);  Surgeon: Clarene Essex, MD;  Location: [place];  Service: Endoscopy;  Laterality: N/A;  . HERNIA REPAIR Bilateral    AGE 73  . ROBOT ASSISTED  LAPAROSCOPIC NEPHRECTOMY Right 07/28/2017   Procedure: XI ROBOTIC ASSISTED LAPAROSCOPIC PARTIAL NEPHRECTOMY;  Surgeon: Raynelle Bring, MD;  Location: [facility];  Service: Urology;  Laterality: Right;  . SPHINCTEROTOMY  09/21/2017   Procedure: SPHINCTEROTOMY;  Surgeon: Clarene Essex, MD;  Location: [facility] ENDOSCOPY;  Service: Endoscopy;;  . Bess Kinds CHOLANGIOSCOPY N/A 09/23/2017   Procedure: YOVZCHYI CHOLANGIOSCOPY;  Surgeon: Clarene Essex, MD;  Location: [place];  Service: Endoscopy;  Laterality: N/A;  . TONSILLECTOMY     age 38  . TRANSURETHRAL RESECTION OF PROSTATE    . TRANSURETHRAL RESECTION OF PROSTATE N/A 09/08/2017   Procedure: TRANSURETHRAL RESECTION OF THE PROSTATE (TURP);  Surgeon: Irine Seal, MD;  Location: [facility];  Service: Urology;  Laterality: N/A;  . UPPER GASTROINTESTINAL ENDOSCOPY       reports that he has never smoked. He has never used smokeless tobacco. He reports that he does not drink alcohol or use drugs.  Allergies  Allergen Reactions  . Ciprofloxacin Other (See Comments)    Unknown  . Codeine Other (See Comments)    Hallucinations   . Shellfish-Derived Products Swelling    "Eyes swell shut"    Family History  Problem Relation Age of Onset  . Allergies Mother   . Diabetes Mother   . Stroke Mother   . Skin cancer Mother   . Allergies Sister   . Asthma Daughter        childhood  . Asthma Father        developed a lot of medical problems after a fall  . Colon cancer Neg Hx   . Stomach cancer Neg Hx   . Rectal cancer Neg Hx   . Liver cancer Neg Hx   . Esophageal cancer Neg Hx      Prior to Admission medications   Medication Sig Start Date End Date Taking? Authorizing Provider  amLODipine (NORVASC) 10 MG tablet Take 10 mg by mouth daily.   Yes [provider]  gabapentin (NEURONTIN) 300 MG capsule Take 300 mg by mouth at bedtime.   Yes [provider]  hydrALAZINE (APRESOLINE) 100 MG tablet Take 100 mg by mouth 3 (three) times daily.   Yes  [provider]  labetalol (NORMODYNE) 200 MG tablet Take 400 mg by mouth 3 (three) times daily.    Yes [provider]  lisinopril (PRINIVIL,ZESTRIL) 20 MG tablet Take 40 mg by mouth daily.    Yes [provider]  neomycin-polymyxin-dexameth (MAXITROL) 0.1 % OINT Place 1 application into both eyes daily as needed (eye irritation).   Yes [provider]  ondansetron (ZOFRAN) 4 MG tablet Take 4 mg by mouth 2 (two) times daily as needed for nausea/vomiting. 09/16/17  Yes [provider]  pantoprazole (PROTONIX) 20 MG tablet Take 20 mg by mouth daily.  07/11/16  Yes [provider]  pioglitazone (ACTOS) 15 MG tablet Take 15 mg by mouth daily.   Yes [provider]  polyethylene glycol (MIRALAX / GLYCOLAX) packet Take 17 g by mouth daily as needed for mild constipation or moderate constipation. 09/26/17  Yes Sarajane Jews,  Melene Plan, MD  sitaGLIPtin (JANUVIA) 100 MG tablet Take 100 mg by mouth daily.   Yes [provider]    Physical Exam: Vitals:   09/30/17 2031 09/30/17 2100 09/30/17 2200 09/30/17 2230  BP: (!) 197/90 (!) 164/76 (!) 141/70 (!) 157/78  Pulse:  93 91 85  Resp:  20    Temp:      TempSrc:      SpO2:  97% 90% 93%  Weight:      Height:          Constitutional: NAD, calm  Eyes: PERTLA, lids and conjunctivae normal ENMT: Mucous membranes are moist. Posterior pharynx clear of any exudate or lesions.   Neck: normal, supple, no masses, no thyromegaly Respiratory: clear to auscultation bilaterally, no wheezing, no crackles. Normal respiratory effort.  Cardiovascular: S1 & S2 heard, regular rate and rhythm. No extremity edema.  Abdomen: No distension, soft, tender in RUQ, no rebound pain or guarding. Bowel sounds active.  Musculoskeletal: no clubbing / cyanosis. No joint deformity upper and lower extremities.   Skin: no significant rashes, lesions, ulcers. Warm, dry, well-perfused. Neurologic: CN 2-12 grossly intact.  Sensation intact. Strength 5/5 in all 4 limbs.  Psychiatric: Alert and oriented x 3. Pleasant and cooperative.     Labs on Admission: I have personally reviewed following labs and imaging studies  CBC: Recent Labs  Lab 09/24/17 0528 09/25/17 0441 09/26/17 0328 09/30/17 1949  WBC 14.3* 9.9 9.4 19.2*  NEUTROABS 11.3*  --   --  16.6*  HGB 10.1* 10.1* 9.4* 11.3*  HCT 31.4* 31.3* 29.9* 35.7*  MCV 92.9 94.8 94.9 94.2  PLT 305 281 271 010   Basic Metabolic Panel: Recent Labs  Lab 09/24/17 0528 09/25/17 0441 09/26/17 0328 09/30/17 1715  NA 138 139 138 140  K 3.4* 4.4 3.6 4.3  CL 102 104 104 105  CO2 25 26 26 24   GLUCOSE 117* 109* 117* 132*  BUN 13 20 20 12   CREATININE 1.21 1.41* 1.27* 1.24  CALCIUM 9.1 9.1 8.6* 9.5  MG 1.8 1.8 1.5*  --    GFR: Estimated Creatinine Clearance: 57.7 mL/min (by C-G formula based on SCr of 1.24 mg/dL). Liver Function Tests: Recent Labs  Lab 09/24/17 0528 09/25/17 0441 09/26/17 0328 09/30/17 1715  AST 57* 60* 41 37  ALT 100* 90* 71* 51*  ALKPHOS 313* 332* 265* 335*  BILITOT 2.8* 2.8* 2.0* 1.8*  PROT 6.6 6.2* 5.8* 7.0  ALBUMIN 3.2* 3.0* 2.8* 3.4*   Recent Labs  Lab 09/30/17 1715  LIPASE 21   No results for input(s): AMMONIA in the last 168 hours. Coagulation Profile: No results for input(s): INR, PROTIME in the last 168 hours. Cardiac Enzymes: No results for input(s): CKTOTAL, CKMB, CKMBINDEX, TROPONINI in the last 168 hours. BNP (last 3 results) No results for input(s): PROBNP in the last 8760 hours. HbA1C: No results for input(s): HGBA1C in the last 72 hours. CBG: Recent Labs  Lab 09/25/17 0749 09/25/17 1136 09/25/17 1643 09/25/17 2116 09/26/17 0800  GLUCAP 106* 187* 185* 173* 118*   Lipid Profile: No results for input(s): CHOL, HDL, LDLCALC, TRIG, CHOLHDL, LDLDIRECT in the last 72 hours. Thyroid Function Tests: No results for input(s): TSH, T4TOTAL, FREET4, T3FREE, THYROIDAB in the last 72 hours. Anemia Panel: No  results for input(s): VITAMINB12, FOLATE, FERRITIN, TIBC, IRON, RETICCTPCT in the last 72 hours. Urine analysis:    Component Value Date/Time   COLORURINE YELLOW 09/30/2017 2106   APPEARANCEUR CLEAR 09/30/2017 2106  LABSPEC 1.016 09/30/2017 2106   PHURINE 5.0 09/30/2017 2106   GLUCOSEU NEGATIVE 09/30/2017 2106   HGBUR SMALL (A) 09/30/2017 2106   [place] NEGATIVE 09/30/2017 2106   [place] NEGATIVE 09/30/2017 2106   PROTEINUR 30 (A) 09/30/2017 2106   NITRITE NEGATIVE 09/30/2017 2106   LEUKOCYTESUR SMALL (A) 09/30/2017 2106   Sepsis Labs: @LABRCNTIP (procalcitonin:4,lacticidven:4) )No results found for this or any previous visit (from the past 240 hour(s)).   Radiological Exams on Admission: Dg Chest 2 View  Result Date: 09/30/2017 CLINICAL DATA:  Epigastric pain.  Known thoracic aneurysm. EXAM: CHEST - 2 VIEW COMPARISON:  CT chest 05/25/2017. FINDINGS: Normal heart size. Clear lung fields. No acute bony abnormality. No effusion or pneumothorax. Thoracic aortic enlargement, consistent with known dissection. IMPRESSION: No active cardiopulmonary disease. No apparent interval aortic enlargement or LEFT effusion. Electronically Signed   By: Staci Righter M.D.   On: 09/30/2017 17:59   Ct Angio Chest/abd/pel For Dissection W And/or Wo Contrast  Result Date: 09/30/2017 CLINICAL DATA:  61 year old male with back pain and abdominal pain. Nausea vomiting. Concern for aortic dissection. Known type B dissection. Recent ultrasound-guided biopsy of right lobe of the liver. EXAM: CT ANGIOGRAPHY CHEST, ABDOMEN AND PELVIS TECHNIQUE: Multidetector CT imaging through the chest, abdomen and pelvis was performed using the standard protocol during bolus administration of intravenous contrast. Multiplanar reconstructed images and MIPs were obtained and reviewed to evaluate the vascular anatomy. CONTRAST:  148m ISOVUE-370 IOPAMIDOL (ISOVUE-370) INJECTION 76% COMPARISON:  CT of the abdomen pelvis dated  09/20/2017 and 05/25/2017 FINDINGS: CTA CHEST FINDINGS Cardiovascular: There is no cardiomegaly. Trace pericardial effusion similar to prior CT. Dissection flap origin ating in the aortic arch distal to the origin of the left subclavian artery consistent with known type B dissection and similar to prior CT. No aneurysmal dilatation. No change in the caliber of the aorta since the prior CT. No periaortic stranding or extravasation of contrast. The origins of the great vessels of the aortic arch are patent. There is no CT evidence of pulmonary embolism. Mediastinum/Nodes: No hilar or mediastinal adenopathy. The esophagus is grossly unremarkable. No mediastinal fluid collection or hematoma. Mild heterogeneity of the left lobe of the thyroid gland with probable small nodules. Ultrasound may provide better evaluation. Lungs/Pleura: There is a trace left pleural effusion. Minimal bibasilar dependent atelectasis as well as linear atelectasis/scarring at the right lung base. No focal consolidation, or pneumothorax. The central airways are patent. Musculoskeletal: No chest wall abnormality. No acute or significant osseous findings. Degenerative changes of the spine. Review of the MIP images confirms the above findings. CTA ABDOMEN AND PELVIS FINDINGS VASCULAR Aorta: Dissection flap extends along the entire length of the abdominal aorta. No aneurysmal dilatation. No change in the caliber of the aorta since the prior CT. No periaortic stranding or extravasation of contrast. Celiac: There is extension of the dissection flap into the celiac artery similar to prior CT. No change in the caliber of the celiac artery. The celiac artery and its branches remain patent. There is a replaced left hepatic artery from the left gastric artery. SMA: The SMA is patent and arises from the true lumen. There may be extension of the dissection flap to the origin of the SMA. Renals: The right renal artery is patent and arises from the true lumen.  The left renal artery arises from the false lumen. The left renal artery is occluded similar to prior CT. IMA: The IMA is patent. The dissection flap extends to the origin of the  IMA similar to prior CT. Inflow: Dissection extends to the proximal left common iliac artery similar to prior CT and terminates there. The iliac arteries are patent bilaterally. Veins: No obvious venous abnormality within the limitations of this arterial phase study. Review of the MIP images confirms the above findings. NON-VASCULAR There is no intra-abdominal free air.  Trace subhepatic free fluid. Hepatobiliary: Multiple hepatic hypodense lesions suspicious for metastatic disease. These lesions measure up to 2.7 cm in segment IV B. there has been interval placement of a biliary stent with associated pneumobilia. Evaluation for stent patency is limited on the CT. Some soft tissue density noted within the mid to distal lumen of the stent. The gallbladder is distended and contains multiple calcified stones. High attenuating content within the gallbladder likely vicarious excretion of contrast from recent procedure. There is mild diffuse gallbladder wall thickening and stranding which may be reactive. Clinical correlation is recommended. Pancreas: Ill-defined soft tissue density in the uncinate process of the pancreas is not well characterized. There is atrophy of the body and tail of the pancreas. No ductal dilatation. Spleen: Unremarkable. Adrenals/Urinary Tract: There is a 14 mm left adrenal nodule, indeterminate. The right adrenal gland is unremarkable. Infarcted and atrophic left kidney. Postsurgical changes related to partial right nephrectomy. There is no hydronephrosis on the right. The visualized ureters and urinary bladder appear unremarkable. Stomach/Bowel: Moderate stool throughout the colon. No bowel dilatation or evidence of obstruction. Lymphatic: Multiple peripancreatic, portacaval enlarged lymph nodes. Multiple mildly rounded  retroperitoneal and para-aortic lymph nodes. Reproductive: Mildly enlarged prostate gland measures approximately 5 cm in transverse diameter. Other: Anterior lower abdominal fat containing hernia similar prior CT. No inflammatory changes or fluid collection. Musculoskeletal: No acute or significant osseous findings. Review of the MIP images confirms the above findings. IMPRESSION: 1. Similar appearance of known type B dissection as the prior CT of 05/25/2017. No periaortic stranding, extraluminal contrast, or change in the aortic aortic caliber. Extension of the dissection flap into the celiac axis, origins of the SMA and IMA and occlusion of the left renal artery as seen previously. 2. Interval placement of a biliary stent. Apparent soft tissue density in the mid to distal lumen of the stent noted however, there is pneumobilia suggestive of stent patency. 3. Distended gallbladder with multiple stones and vicariously excreted contrast from prior imaging studies. There is mild thickened appearance of the gallbladder wall which may be reactive. Acute cholecystitis is not excluded. Clinical correlation is recommended. 4. Multiple hepatic metastatic disease. Recent biopsy of liver lesion. No large hematoma. 5. Ill-defined soft tissue in the uncinate process of the pancreas with atrophy of the distal gland. 6. Infarcted and atrophic left kidney and postsurgical changes of partial right nephrectomy. 7. Retroperitoneal, and periportal adenopathy. Electronically Signed   By: Anner Crete M.D.   On: 09/30/2017 21:37   US Abdomen Limited Ruq  Result Date: 09/30/2017 CLINICAL DATA:  Right upper quadrant abdominal pain. Status post right nephrectomy for renal cancer on 07/28/2017. Nausea, vomiting and obstructive jaundice. EXAM: ULTRASOUND ABDOMEN LIMITED RIGHT UPPER QUADRANT COMPARISON:  Abdomen and pelvis CT dated 09/20/2017 and abdomen MR dated 06/24/2017. FINDINGS: Gallbladder: Diffuse gallbladder wall thickening  with a maximum thickness of 4.5 mm. Sludge in the gallbladder. There is also a 1.2 cm shadowing stone in the gallbladder. There is a 2nd similar sized stone in the gallbladder on the recent CT. Minimal pericholecystic fluid. There was a positive sonographic Murphy sign. Common bile duct: Diameter: 3.8 mm Liver: Multiple masses of  varying size and echogenicity. The largest measures 3.6 cm in maximum diameter. Normal liver echotexture. There is also evidence of pneumobilia in the left lobe of the liver. Portal vein is patent on color Doppler imaging with normal direction of blood flow towards the liver. IMPRESSION: 1. Cholelithiasis, sludge in the gallbladder, diffuse gallbladder wall thickening, minimal pericholecystic fluid and positive sonographic Murphy sign. This combination of findings is compatible with a diagnosis of acute cholecystitis. 2. Left lobe liver pneumobilia. 3. Multiple liver metastases. Electronically Signed   By: Claudie Revering M.D.   On: 09/30/2017 19:20    EKG: Independently reviewed. Sinus rhythm.   Assessment/Plan   1. Acute cholecystitis  - Presents with abdominal pain, N/V  - Found to have new leukocytosis and US findings suggestive of acute cholecystitis  - Surgery and GI consulted by ED physician and much appreciated  - Started on Zosyn, will continue  - Continue bowel-rest, prn analgesia, antiemetics, Zosyn    2. Biliary obstruction s/p stent    - CBD stent placed earlier this month  - Transaminases and bilirubin continue downtrend   3. Liver mass  - Status-post biopsy by IR on 7/11, following with GI    4. Hypertension  - BP at goal  - Use hydralazine IVP's prn for now while NPO    5. Type II DM  - A1c was 6.8% in June  - Managed at home with Actos and Januvia, held on admission  - Follow CBG's and use a low-intensity SSI with Novolog while in hospital    DVT prophylaxis: SCD's  Code Status: Full  Family Communication: Discussed with patient  Consults  called: GI, surgery Admission status: Inpatient     Vianne Bulls, MD [facility] Pager [phone]  If 7PM-7AM, please contact night-coverage [email or web address] Password [facility]  09/30/2017, 11:16 PM

## 2017-09-30 NOTE — ED Triage Notes (Signed)
The pt is /o back pain since yesterday abd pain today with nausea and vomiting

## 2017-09-30 NOTE — ED Notes (Signed)
Patient reminded that we need urine specimen

## 2017-10-01 DIAGNOSIS — C641 Malignant neoplasm of right kidney, except renal pelvis: Secondary | ICD-10-CM

## 2017-10-01 DIAGNOSIS — G4733 Obstructive sleep apnea (adult) (pediatric): Secondary | ICD-10-CM

## 2017-10-01 DIAGNOSIS — R160 Hepatomegaly, not elsewhere classified: Secondary | ICD-10-CM

## 2017-10-01 LAB — COMPREHENSIVE METABOLIC PANEL
ALT: 39 U/L (ref 0–44)
AST: 28 U/L (ref 15–41)
Albumin: 2.8 g/dL — ABNORMAL LOW (ref 3.5–5.0)
Alkaline Phosphatase: 265 U/L — ABNORMAL HIGH (ref 38–126)
Anion gap: 9 (ref 5–15)
BILIRUBIN TOTAL: 1.8 mg/dL — AB (ref 0.3–1.2)
BUN: 11 mg/dL (ref 8–23)
CO2: 24 mmol/L (ref 22–32)
CREATININE: 1.05 mg/dL (ref 0.61–1.24)
Calcium: 8.6 mg/dL — ABNORMAL LOW (ref 8.9–10.3)
Chloride: 104 mmol/L (ref 98–111)
GFR calc Af Amer: >60 mL/min (ref >60)
Glucose, Bld: 127 mg/dL — ABNORMAL HIGH (ref 70–99)
Potassium: 3.4 mmol/L — ABNORMAL LOW (ref 3.5–5.1)
Sodium: 137 mmol/L (ref 135–145)
TOTAL PROTEIN: 6 g/dL — AB (ref 6.5–8.1)

## 2017-10-01 LAB — CBC WITH DIFFERENTIAL/PLATELET
Abs Immature Granulocytes: 0.1 10*3/uL (ref 0.0–0.1)
Basophils Absolute: 0.1 10*3/uL (ref 0.0–0.1)
Basophils Relative: 0 %
EOS ABS: 0.1 10*3/uL (ref 0.0–0.7)
EOS PCT: 0 %
HEMATOCRIT: 32.8 % — AB (ref 39.0–52.0)
Hemoglobin: 10.2 g/dL — ABNORMAL LOW (ref 13.0–17.0)
Immature Granulocytes: 1 %
LYMPHS ABS: 1.4 10*3/uL (ref 0.7–4.0)
Lymphocytes Relative: 6 %
MCH: 29.6 pg (ref 26.0–34.0)
MCHC: 31.1 g/dL (ref 30.0–36.0)
MCV: 95.1 fL (ref 78.0–100.0)
MONO ABS: 1.4 10*3/uL — AB (ref 0.1–1.0)
MONOS PCT: 6 %
Neutro Abs: 18.7 10*3/uL — ABNORMAL HIGH (ref 1.7–7.7)
Neutrophils Relative %: 87 %
Platelets: 297 10*3/uL (ref 150–400)
RBC: 3.45 MIL/uL — ABNORMAL LOW (ref 4.22–5.81)
RDW: 15.4 % (ref 11.5–15.5)
WBC: 21.7 10*3/uL — ABNORMAL HIGH (ref 4.0–10.5)

## 2017-10-01 LAB — GLUCOSE, CAPILLARY
GLUCOSE-CAPILLARY: 167 mg/dL — AB (ref 70–99)
Glucose-Capillary: 109 mg/dL — ABNORMAL HIGH (ref 70–99)
Glucose-Capillary: 123 mg/dL — ABNORMAL HIGH (ref 70–99)
Glucose-Capillary: 130 mg/dL — ABNORMAL HIGH (ref 70–99)
Glucose-Capillary: 132 mg/dL — ABNORMAL HIGH (ref 70–99)

## 2017-10-01 LAB — LIPASE, BLOOD: LIPASE: 20 U/L (ref 11–51)

## 2017-10-01 MED ORDER — LISINOPRIL 40 MG PO TABS
40.0000 mg | ORAL_TABLET | Freq: Every day | ORAL | Status: DC
  Administered 2017-10-01 – 2017-10-02 (×2): 40 mg via ORAL
  Filled 2017-10-01 (×2): qty 1

## 2017-10-01 MED ORDER — OXYCODONE HCL 5 MG PO TABS
5.0000 mg | ORAL_TABLET | ORAL | Status: DC | PRN
  Administered 2017-10-01 – 2017-10-10 (×38): 5 mg via ORAL
  Filled 2017-10-01 (×38): qty 1

## 2017-10-01 MED ORDER — LABETALOL HCL 5 MG/ML IV SOLN
5.0000 mg | Freq: Four times a day (QID) | INTRAVENOUS | Status: DC | PRN
  Filled 2017-10-01: qty 4

## 2017-10-01 MED ORDER — PROMETHAZINE HCL 25 MG/ML IJ SOLN
12.5000 mg | Freq: Once | INTRAMUSCULAR | Status: AC
  Administered 2017-10-01: 12.5 mg via INTRAVENOUS
  Filled 2017-10-01: qty 1

## 2017-10-01 MED ORDER — AMLODIPINE BESYLATE 5 MG PO TABS
10.0000 mg | ORAL_TABLET | Freq: Every day | ORAL | Status: DC
Start: 2017-10-01 — End: 2017-10-03
  Administered 2017-10-01 – 2017-10-02 (×2): 10 mg via ORAL
  Filled 2017-10-01: qty 4
  Filled 2017-10-01: qty 2

## 2017-10-01 MED ORDER — MORPHINE SULFATE (PF) 2 MG/ML IV SOLN
2.0000 mg | Freq: Once | INTRAVENOUS | Status: AC
  Administered 2017-10-01: 2 mg via INTRAVENOUS
  Filled 2017-10-01: qty 1

## 2017-10-01 MED ORDER — HYDROMORPHONE HCL 1 MG/ML IJ SOLN: 1.0000 mg | INTRAMUSCULAR | Status: DC | PRN

## 2017-10-01 MED ORDER — LABETALOL HCL 100 MG PO TABS
400.0000 mg | ORAL_TABLET | Freq: Three times a day (TID) | ORAL | Status: DC
  Administered 2017-10-01 – 2017-10-03 (×6): 400 mg via ORAL
  Filled 2017-10-01 (×6): qty 4

## 2017-10-01 MED ORDER — HYDRALAZINE HCL 50 MG PO TABS
100.0000 mg | ORAL_TABLET | Freq: Three times a day (TID) | ORAL | Status: DC
  Administered 2017-10-01 – 2017-10-14 (×36): 100 mg via ORAL
  Filled 2017-10-01 (×39): qty 2

## 2017-10-01 NOTE — Consult Note (Signed)
Reason for Consult:Cholecystitis Referring Physician: T. Opyd  Jerry Weiss is an 62 y.o. male.  HPI: Jerry Weiss has had a complex medical history recently.  Initially, a type B aortic dissection was diagnosed and is being treated medically.  During that evaluation, he was noted to have a right renal mass.  Subsequently, he underwent laparoscopic-assisted right partial nephrectomy by Dr. Alinda Money on 07/28/2017.  Subsequently he underwent TURP by Dr. Diona Fanti on 09/08/2017.  He was recently admitted to the hospitalist service with painless jaundice.  Dr. Watt Climes performed ERCP with stent placement on 09/21/2017 and then repeat ERCP for stent migration 09/23/2017.  Patient initially did well and was discharged home but he returns to the emergency room tonight with 24-hour history of right upper quadrant pain, nausea, and vomiting.  He has known gallstones.  I was asked to see him for cholecystitis.  Past Medical History:  Diagnosis Date  . Anemia    mild  . 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 GERD (gastroesophageal reflux disease)   . Hypertension   . Inguinal hernia    Bilateral age 62  . Leg muscle spasm    both legs occ  . OSA (obstructive sleep apnea)    Wears CPAP at night  . Pneumonia    history of x3  . Renal cancer, right ([place])   . 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 Renal mass, right   . Status post dilation of esophageal narrowing   . Umbilical hernia    Small noted on CT 05/26/2017  . Ventral hernia    noted on CT 05/26/2017    Past Surgical History:  Procedure Laterality Date  . BILIARY STENT PLACEMENT  09/21/2017   Procedure: BILIARY STENT PLACEMENT;  Surgeon: Clarene Essex, MD;  Location: [facility] ENDOSCOPY;  Service: Endoscopy;;  . BILIARY STENT PLACEMENT  09/23/2017   Procedure: BILIARY STENT PLACEMENT;  Surgeon:  Clarene Essex, MD;  Location: [place];  Service: Endoscopy;;  . BRONCHIAL BRUSHINGS  09/21/2017   Procedure: BILIARY BRUSHINGS;  Surgeon: Clarene Essex, MD;  Location: [place];  Service: Endoscopy;;  . COLONOSCOPY    . ERCP N/A 09/21/2017   Procedure: ENDOSCOPIC RETROGRADE CHOLANGIOPANCREATOGRAPHY (ERCP);  Surgeon: Clarene Essex, MD;  Location: [place];  Service: Endoscopy;  Laterality: N/A;  . ERCP N/A 09/23/2017   Procedure: ENDOSCOPIC RETROGRADE CHOLANGIOPANCREATOGRAPHY (ERCP);  Surgeon: Clarene Essex, MD;  Location: [place];  Service: Endoscopy;  Laterality: N/A;  . HERNIA REPAIR Bilateral    AGE 51  . ROBOT ASSISTED LAPAROSCOPIC NEPHRECTOMY Right 07/28/2017   Procedure: XI ROBOTIC ASSISTED LAPAROSCOPIC PARTIAL NEPHRECTOMY;  Surgeon: Raynelle Bring, MD;  Location: [facility];  Service: Urology;  Laterality: Right;  . SPHINCTEROTOMY  09/21/2017   Procedure: SPHINCTEROTOMY;  Surgeon: Clarene Essex, MD;  Location: [facility] ENDOSCOPY;  Service: Endoscopy;;  . Bess Kinds CHOLANGIOSCOPY N/A 09/23/2017   Procedure: XTGGYIRS CHOLANGIOSCOPY;  Surgeon: Clarene Essex, MD;  Location: [place];  Service: Endoscopy;  Laterality: N/A;  . TONSILLECTOMY     age 60  . TRANSURETHRAL RESECTION OF PROSTATE    . TRANSURETHRAL RESECTION OF PROSTATE N/A 09/08/2017   Procedure: TRANSURETHRAL RESECTION OF THE PROSTATE (TURP);  Surgeon: Irine Seal, MD;  Location: [facility];  Service: Urology;  Laterality: N/A;  . UPPER GASTROINTESTINAL ENDOSCOPY      Family History  Problem Relation Age of Onset  . Allergies Mother   . Diabetes Mother   . Stroke Mother   . Skin cancer Mother   . Allergies Sister   . Asthma Daughter        childhood  . Asthma Father        developed a lot of medical problems after a fall  . Colon cancer Neg Hx   . Stomach cancer Neg Hx   . Rectal cancer Neg Hx   . Liver cancer Neg Hx   . Esophageal cancer Neg Hx     Social History:  reports that he has never smoked. He has never used  smokeless tobacco. He reports that he does not drink alcohol or use drugs.  Allergies:  Allergies  Allergen Reactions  . Ciprofloxacin Other (See Comments)    Unknown  . Codeine Other (See Comments)    Hallucinations   . Shellfish-Derived Products Swelling    "Eyes swell shut"    Medications: I have reviewed the patient's current medications.  Results for orders placed or performed during the hospital encounter of 09/30/17 (from the past 48 hour(s))  Lipase, blood     Status: None   Collection Time: 09/30/17  5:15 PM  Result Value Ref Range   Lipase 21 11 - 51 U/L    Comment: Performed at [facility], [place] [address] [place], [place] [address]  Comprehensive metabolic panel     Status: Abnormal   Collection Time: 09/30/17  5:15 PM  Result Value Ref Range   Sodium 140 135 - 145 mmol/L   Potassium 4.3 3.5 - 5.1 mmol/L   Chloride 105 98 - 111 mmol/L    Comment: Please note change in reference range.   CO2 24 22 - 32 mmol/L   Glucose, Bld 132 (H) 70 - 99 mg/dL    Comment: Please note change in reference range.   BUN 12 8 - 23 mg/dL    Comment: Please note change in reference range.   Creatinine, Ser 1.24 0.61 - 1.24 mg/dL   Calcium 9.5 8.9 - 10.3 mg/dL   Total Protein 7.0 6.5 - 8.1 g/dL   Albumin 3.4 (L) 3.5 - 5.0 g/dL   AST 37 15 - 41 U/L   ALT 51 (H) 0 - 44 U/L    Comment: Please note change in reference range.   Alkaline Phosphatase 335 (H) 38 - 126 U/L   Total Bilirubin 1.8 (H) 0.3 - 1.2 mg/dL   GFR calc non Af Amer >60 >60 mL/min   GFR calc Af Amer >60 >60 mL/min    Comment: (NOTE) The eGFR has been calculated using the CKD EPI equation. This calculation has not been validated in all clinical situations. eGFR's persistently <60 mL/min signify possible Chronic Kidney Disease.    Anion gap 11 5 - 15    Comment: Performed at [facility] [address], [place], [place] [address]  I-stat troponin, ED     Status: None   Collection Time: 09/30/17  5:19  PM  Result Value Ref Range   Troponin i, poc 0.00 0.00 - 0.08 ng/mL   Comment 3            Comment: Due to the release kinetics of cTnI, a negative result within the first hours of the onset of symptoms does not rule out myocardial infarction with certainty. If myocardial infarction is still suspected, repeat the test at appropriate intervals.   CBC with  Differential     Status: Abnormal   Collection Time: 09/30/17  7:49 PM  Result Value Ref Range   WBC 19.2 (H) 4.0 - 10.5 K/uL   RBC 3.79 (L) 4.22 - 5.81 MIL/uL   Hemoglobin 11.3 (L) 13.0 - 17.0 g/dL   HCT 35.7 (L) 39.0 - 52.0 %   MCV 94.2 78.0 - 100.0 fL   MCH 29.8 26.0 - 34.0 pg   MCHC 31.7 30.0 - 36.0 g/dL   RDW 15.0 11.5 - 15.5 %   Platelets 313 150 - 400 K/uL   Neutrophils Relative % 86 %   Neutro Abs 16.6 (H) 1.7 - 7.7 K/uL   Lymphocytes Relative 6 %   Lymphs Abs 1.2 0.7 - 4.0 K/uL   Monocytes Relative 6 %   Monocytes Absolute 1.1 (H) 0.1 - 1.0 K/uL   Eosinophils Relative 1 %   Eosinophils Absolute 0.1 0.0 - 0.7 K/uL   Basophils Relative 0 %   Basophils Absolute 0.0 0.0 - 0.1 K/uL   Immature Granulocytes 1 %   Abs Immature Granulocytes 0.1 0.0 - 0.1 K/uL    Comment: Performed at [facility], [place] [address], [place], [place] [address]  Urinalysis, Routine w reflex microscopic     Status: Abnormal   Collection Time: 09/30/17  9:06 PM  Result Value Ref Range   Color, Urine YELLOW YELLOW   APPearance CLEAR CLEAR   Specific Gravity, Urine 1.016 1.005 - 1.030   pH 5.0 5.0 - 8.0   Glucose, UA NEGATIVE NEGATIVE mg/dL   Hgb urine dipstick SMALL (A) NEGATIVE   Bilirubin Urine NEGATIVE NEGATIVE   Ketones, ur NEGATIVE NEGATIVE mg/dL   Protein, ur 30 (A) NEGATIVE mg/dL   Nitrite NEGATIVE NEGATIVE   Leukocytes, UA SMALL (A) NEGATIVE   RBC / HPF 21-50 0 - 5 RBC/hpf   WBC, UA 21-50 0 - 5 WBC/hpf   Bacteria, UA NONE SEEN NONE SEEN   Mucus PRESENT     Comment: Performed at [facility] [address],  [place], [place] [address]  Glucose, capillary     Status: Abnormal   Collection Time: 10/01/17 12:16 AM  Result Value Ref Range   Glucose-Capillary 130 (H) 70 - 99 mg/dL    Dg Chest 2 View  Result Date: 09/30/2017 CLINICAL DATA:  Epigastric pain.  Known thoracic aneurysm. EXAM: CHEST - 2 VIEW COMPARISON:  CT chest 05/25/2017. FINDINGS: Normal heart size. Clear lung fields. No acute bony abnormality. No effusion or pneumothorax. Thoracic aortic enlargement, consistent with known dissection. IMPRESSION: No active cardiopulmonary disease. No apparent interval aortic enlargement or LEFT effusion. Electronically Signed   By: Staci Righter M.D.   On: 09/30/2017 17:59   Ct Angio Chest/abd/pel For Dissection W And/or Wo Contrast  Result Date: 09/30/2017 CLINICAL DATA:  62 year old male with back pain and abdominal pain. Nausea vomiting. Concern for aortic dissection. Known type B dissection. Recent ultrasound-guided biopsy of right lobe of the liver. EXAM: CT ANGIOGRAPHY CHEST, ABDOMEN AND PELVIS TECHNIQUE: Multidetector CT imaging through the chest, abdomen and pelvis was performed using the standard protocol during bolus administration of intravenous contrast. Multiplanar reconstructed images and MIPs were obtained and reviewed to evaluate the vascular anatomy. CONTRAST:  122m ISOVUE-370 IOPAMIDOL (ISOVUE-370) INJECTION 76% COMPARISON:  CT of the abdomen pelvis dated 09/20/2017 and 05/25/2017 FINDINGS: CTA CHEST FINDINGS Cardiovascular: There is no cardiomegaly. Trace pericardial effusion similar to prior CT. Dissection flap origin ating in the aortic arch distal to the  origin of the left subclavian artery consistent with known type B dissection and similar to prior CT. No aneurysmal dilatation. No change in the caliber of the aorta since the prior CT. No periaortic stranding or extravasation of contrast. The origins of the great vessels of the aortic arch are patent. There is no CT evidence of pulmonary  embolism. Mediastinum/Nodes: No hilar or mediastinal adenopathy. The esophagus is grossly unremarkable. No mediastinal fluid collection or hematoma. Mild heterogeneity of the left lobe of the thyroid gland with probable small nodules. Ultrasound may provide better evaluation. Lungs/Pleura: There is a trace left pleural effusion. Minimal bibasilar dependent atelectasis as well as linear atelectasis/scarring at the right lung base. No focal consolidation, or pneumothorax. The central airways are patent. Musculoskeletal: No chest wall abnormality. No acute or significant osseous findings. Degenerative changes of the spine. Review of the MIP images confirms the above findings. CTA ABDOMEN AND PELVIS FINDINGS VASCULAR Aorta: Dissection flap extends along the entire length of the abdominal aorta. No aneurysmal dilatation. No change in the caliber of the aorta since the prior CT. No periaortic stranding or extravasation of contrast. Celiac: There is extension of the dissection flap into the celiac artery similar to prior CT. No change in the caliber of the celiac artery. The celiac artery and its branches remain patent. There is a replaced left hepatic artery from the left gastric artery. SMA: The SMA is patent and arises from the true lumen. There may be extension of the dissection flap to the origin of the SMA. Renals: The right renal artery is patent and arises from the true lumen. The left renal artery arises from the false lumen. The left renal artery is occluded similar to prior CT. IMA: The IMA is patent. The dissection flap extends to the origin of the IMA similar to prior CT. Inflow: Dissection extends to the proximal left common iliac artery similar to prior CT and terminates there. The iliac arteries are patent bilaterally. Veins: No obvious venous abnormality within the limitations of this arterial phase study. Review of the MIP images confirms the above findings. NON-VASCULAR There is no intra-abdominal free  air.  Trace subhepatic free fluid. Hepatobiliary: Multiple hepatic hypodense lesions suspicious for metastatic disease. These lesions measure up to 2.7 cm in segment IV B. there has been interval placement of a biliary stent with associated pneumobilia. Evaluation for stent patency is limited on the CT. Some soft tissue density noted within the mid to distal lumen of the stent. The gallbladder is distended and contains multiple calcified stones. High attenuating content within the gallbladder likely vicarious excretion of contrast from recent procedure. There is mild diffuse gallbladder wall thickening and stranding which may be reactive. Clinical correlation is recommended. Pancreas: Ill-defined soft tissue density in the uncinate process of the pancreas is not well characterized. There is atrophy of the body and tail of the pancreas. No ductal dilatation. Spleen: Unremarkable. Adrenals/Urinary Tract: There is a 14 mm left adrenal nodule, indeterminate. The right adrenal gland is unremarkable. Infarcted and atrophic left kidney. Postsurgical changes related to partial right nephrectomy. There is no hydronephrosis on the right. The visualized ureters and urinary bladder appear unremarkable. Stomach/Bowel: Moderate stool throughout the colon. No bowel dilatation or evidence of obstruction. Lymphatic: Multiple peripancreatic, portacaval enlarged lymph nodes. Multiple mildly rounded retroperitoneal and para-aortic lymph nodes. Reproductive: Mildly enlarged prostate gland measures approximately 5 cm in transverse diameter. Other: Anterior lower abdominal fat containing hernia similar prior CT. No inflammatory changes or fluid collection. Musculoskeletal:  No acute or significant osseous findings. Review of the MIP images confirms the above findings. IMPRESSION: 1. Similar appearance of known type B dissection as the prior CT of 05/25/2017. No periaortic stranding, extraluminal contrast, or change in the aortic aortic  caliber. Extension of the dissection flap into the celiac axis, origins of the SMA and IMA and occlusion of the left renal artery as seen previously. 2. Interval placement of a biliary stent. Apparent soft tissue density in the mid to distal lumen of the stent noted however, there is pneumobilia suggestive of stent patency. 3. Distended gallbladder with multiple stones and vicariously excreted contrast from prior imaging studies. There is mild thickened appearance of the gallbladder wall which may be reactive. Acute cholecystitis is not excluded. Clinical correlation is recommended. 4. Multiple hepatic metastatic disease. Recent biopsy of liver lesion. No large hematoma. 5. Ill-defined soft tissue in the uncinate process of the pancreas with atrophy of the distal gland. 6. Infarcted and atrophic left kidney and postsurgical changes of partial right nephrectomy. 7. Retroperitoneal, and periportal adenopathy. Electronically Signed   By: Anner Crete M.D.   On: 09/30/2017 21:37   US Abdomen Limited Ruq  Result Date: 09/30/2017 CLINICAL DATA:  Right upper quadrant abdominal pain. Status post right nephrectomy for renal cancer on 07/28/2017. Nausea, vomiting and obstructive jaundice. EXAM: ULTRASOUND ABDOMEN LIMITED RIGHT UPPER QUADRANT COMPARISON:  Abdomen and pelvis CT dated 09/20/2017 and abdomen MR dated 06/24/2017. FINDINGS: Gallbladder: Diffuse gallbladder wall thickening with a maximum thickness of 4.5 mm. Sludge in the gallbladder. There is also a 1.2 cm shadowing stone in the gallbladder. There is a 2nd similar sized stone in the gallbladder on the recent CT. Minimal pericholecystic fluid. There was a positive sonographic Murphy sign. Common bile duct: Diameter: 3.8 mm Liver: Multiple masses of varying size and echogenicity. The largest measures 3.6 cm in maximum diameter. Normal liver echotexture. There is also evidence of pneumobilia in the left lobe of the liver. Portal vein is patent on color Doppler  imaging with normal direction of blood flow towards the liver. IMPRESSION: 1. Cholelithiasis, sludge in the gallbladder, diffuse gallbladder wall thickening, minimal pericholecystic fluid and positive sonographic Murphy sign. This combination of findings is compatible with a diagnosis of acute cholecystitis. 2. Left lobe liver pneumobilia. 3. Multiple liver metastases. Electronically Signed   By: Claudie Revering M.D.   On: 09/30/2017 19:20    Review of Systems  Constitutional: Positive for malaise/fatigue. Negative for chills.  HENT: Negative for hearing loss.   Eyes: Negative for blurred vision and double vision.  Respiratory: Negative for cough.   Cardiovascular: Negative for chest pain.  Gastrointestinal: Positive for abdominal pain, nausea and vomiting.  Genitourinary: Negative.   Musculoskeletal: Negative.   Neurological: Negative.   Endo/Heme/Allergies: Negative.   Psychiatric/Behavioral: Negative.    Blood pressure (!) 171/74, pulse 86, temperature 99.4 F (37.4 C), temperature source Oral, resp. rate 20, height 5' 7"  (1.702 m), weight 76.8 kg (169 lb 5 oz), SpO2 97 %. Physical Exam  Constitutional: He is oriented to person, place, and time. He appears well-developed and well-nourished. No distress.  HENT:  Head: Normocephalic.  Right Ear: External ear normal.  Left Ear: External ear normal.  Nose: Nose normal.  Mouth/Throat: Oropharynx is clear and moist.  Eyes: Pupils are equal, round, and reactive to light. EOM are normal. Scleral icterus is present.  Neck: Neck supple. No thyromegaly present.  Cardiovascular: Normal rate, regular rhythm and normal heart sounds.  Respiratory: Effort normal and  breath sounds normal. No respiratory distress. He has no wheezes.  GI: Soft. He exhibits no distension. There is tenderness. There is no rebound and no guarding.  Right upper quadrant tenderness without guarding, no generalized tenderness, no peritonitis  Musculoskeletal: Normal range of  motion. He exhibits no edema.  Neurological: He is alert and oriented to person, place, and time.  Skin: Skin is warm and dry.  Psychiatric: He has a normal mood and affect.    Assessment/Plan: Cholecystitis with cholelithiasis status post ERCP with stent placement for painless jaundice -his biliary stent is likely causing some outflow obstruction from the cystic duct leading to cholecystitis.  In light of his leukocytosis and mild hyperbilirubinemia, an element of cholangitis is likely present.  I agree with Dr. Criss Rosales admission, bowel rest, IV fluids, and IV Zosyn for treatment.  We will discuss with the gastroenterology team this morning for further plans.  He will likely require cholecystectomy in the coming days, though this will be comp located by his recent laparoscopic assisted right partial nephrectomy.  We will continue to follow and consider clear liquids later today if surgery or another procedure is not planned.  Zenovia Jarred 10/01/2017, 5:10 AM

## 2017-10-01 NOTE — Progress Notes (Signed)
PROGRESS NOTE    Jerry Weiss  WEX:[id number] DOB: Sep 01, 1955 DOA: 09/30/2017 PCP: [name], MD      Brief Narrative:  [name] is a 62 y.o. 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  Found also to have new onset DM and incidental RIGHT renal mass at that time.  Dissection treated medically w/ hydral, labetalol, lisionopril, amlodipine has been stable, followed by Dr. Donzetta Matters here. Underwent robotic partial Right nephrectomy on 07/28/17.   Then earlier this month, admitted with painless jaundice, found to have biliary obstruction, liver masses -- ERCP done, stent placed, symptoms resolved. **NOTE** CT guided liver biopsy is documented 7/11, but no surgical pathology specimen is listed in Epic.  Patient was home for about 5 days since this episode until developed back and abdominal pain day of admission with sever vomiting.  CT showed known liver masses and peripancreatic adenopathy.  US showed signs of cholecystitis.    Assessment & Plan:  Acute cholecystitis  -Continue IV antibiotics, with his recent instrumentations, will utilize Zosyn -NPO, IVF -Continue oxycodone for pain -Change to hydromorphone 1 mg q4hrs PRN for breakthrough -Consult general surgery, appreciate cares  Biliary obstruction s/p stent    CBD stent placed earlier this month  -Consult Gastroenterology, appreciate cares  Liver mass  Status-post biopsy by IR on 7/11, see above note.     Hypertension  Severe range hypertension. -Restart amlodipine, lisinopril, hydralazine, labetalol -PRN labetalol for worse hypertension    Type II DM  A1c was 6.8% in June  -Hold ACtos, [place] -SSI corrections     Anemia Mild, normocytic          DVT prophylaxis: SCD Code Status: FULL Family  Communication: None present MDM and disposition Plan: The below labs and imaging reports were reviewed and summarized above.  Outside records were reviewed and summarized above.  IV opiates were adjusted as above.  The patient was admitted with worsening abdominal pain, suspected cholecystitis.  Antibiotics and follow.  Gen Surg to eval for cholecystectomy.   Consultants:   Gen Surg  GI  Procedures:   None  Antimicrobials:   Zosyn 7/19 >>    Subjective: Pain is improved but still present.  No more vomiting.  No confusion.  Able to tolerate clears.  No fever, chest pain.  Objective: Vitals:   09/30/17 2330 10/01/17 0008 10/01/17 0018 10/01/17 0436  BP: (!) 161/76 (!) 149/80  (!) 171/74  Pulse: 83 88  86  Resp:  16  20  Temp:  99.4 F (37.4 C)    TempSrc:  Oral    SpO2: 96% 98%  97%  Weight:   76.8 kg (169 lb 5 oz)   Height:        Intake/Output Summary (Last 24 hours) at 10/01/2017 1147 Last data filed at 10/01/2017 9381 Gross per 24 hour  Intake 1197.37 ml  Output 575 ml  Net 622.37 ml   Filed Weights   09/30/17 1708 10/01/17 0018  Weight: 78.9 kg (174 lb) 76.8 kg (169 lb 5 oz)    Examination: General appearance: thin adult male, alert and in no acute distress.  Lying in bed, appears tired. HEENT: Anicteric, conjunctiva pink, lids and lashes normal. No nasal deformity, discharge, epistaxis.  Lips moist.   Skin: Warm and dry.  mild jaundice.  No  suspicious rashes or lesions. Cardiac: RRR, nl S1-S2, no murmurs appreciated.  Capillary refill is brisk.  JVP not visible.  No LE edema.  Radia  pulses 2+ and symmetric. Respiratory: Normal respiratory rate and rhythm.  CTAB without rales or wheezes. Abdomen: Abdomen soft.  Severe right upper quadrant TTP. No ascites, distension, hepatosplenomegaly.   MSK: No deformities or effusions. Neuro: Awake and alert.  EOMI, moves all extremities. Speech fluent.    Psych: Sensorium intact and responding to questions, attention  normal. Affect normal.  Judgment and insight appear normal.    Data Reviewed: I have personally reviewed following labs and imaging studies:  CBC: Recent Labs  Lab 09/25/17 0441 09/26/17 0328 09/30/17 1949 10/01/17 0535  WBC 9.9 9.4 19.2* 21.7*  NEUTROABS  --   --  16.6* 18.7*  HGB 10.1* 9.4* 11.3* 10.2*  HCT 31.3* 29.9* 35.7* 32.8*  MCV 94.8 94.9 94.2 95.1  PLT 281 271 313 109   Basic Metabolic Panel: Recent Labs  Lab 09/25/17 0441 09/26/17 0328 09/30/17 1715 10/01/17 0535  NA 139 138 140 137  K 4.4 3.6 4.3 3.4*  CL 104 104 105 104  CO2 26 26 24 24   GLUCOSE 109* 117* 132* 127*  BUN 20 20 12 11   CREATININE 1.41* 1.27* 1.24 1.05  CALCIUM 9.1 8.6* 9.5 8.6*  MG 1.8 1.5*  --   --    GFR: Estimated Creatinine Clearance: 68.2 mL/min (by C-G formula based on SCr of 1.05 mg/dL). Liver Function Tests: Recent Labs  Lab 09/25/17 0441 09/26/17 0328 09/30/17 1715 10/01/17 0535  AST 60* 41 37 28  ALT 90* 71* 51* 39  ALKPHOS 332* 265* 335* 265*  BILITOT 2.8* 2.0* 1.8* 1.8*  PROT 6.2* 5.8* 7.0 6.0*  ALBUMIN 3.0* 2.8* 3.4* 2.8*   Recent Labs  Lab 09/30/17 1715 10/01/17 0535  LIPASE 21 20   No results for input(s): AMMONIA in the last 168 hours. Coagulation Profile: No results for input(s): INR, PROTIME in the last 168 hours. Cardiac Enzymes: No results for input(s): CKTOTAL, CKMB, CKMBINDEX, TROPONINI in the last 168 hours. BNP (last 3 results) No results for input(s): PROBNP in the last 8760 hours. HbA1C: No results for input(s): HGBA1C in the last 72 hours. CBG: Recent Labs  Lab 09/25/17 2116 09/26/17 0800 10/01/17 0016 10/01/17 0433 10/01/17 0826  GLUCAP 173* 118* 130* 132* 109*   Lipid Profile: No results for input(s): CHOL, HDL, LDLCALC, TRIG, CHOLHDL, LDLDIRECT in the last 72 hours. Thyroid Function Tests: No results for input(s): TSH, T4TOTAL, FREET4, T3FREE, THYROIDAB in the last 72 hours. Anemia Panel: No results for input(s): VITAMINB12,  FOLATE, FERRITIN, TIBC, IRON, RETICCTPCT in the last 72 hours. Urine analysis:    Component Value Date/Time   COLORURINE YELLOW 09/30/2017 2106   APPEARANCEUR CLEAR 09/30/2017 2106   LABSPEC 1.016 09/30/2017 2106   PHURINE 5.0 09/30/2017 2106   GLUCOSEU NEGATIVE 09/30/2017 2106   HGBUR SMALL (A) 09/30/2017 2106   [place] NEGATIVE 09/30/2017 2106   [place] NEGATIVE 09/30/2017 2106   PROTEINUR 30 (A) 09/30/2017 2106   NITRITE NEGATIVE 09/30/2017 2106   LEUKOCYTESUR SMALL (A) 09/30/2017 2106   Sepsis Labs: @LABRCNTIP (procalcitonin:4,lacticacidven:4)  )No results found for this or any previous visit (from the past 240 hour(s)).       Radiology Studies: Dg Chest 2 View  Result Date: 09/30/2017 CLINICAL DATA:  Epigastric pain.  Known thoracic aneurysm. EXAM: CHEST - 2 VIEW COMPARISON:  CT chest 05/25/2017. FINDINGS: Normal heart size. Clear lung fields.  No acute bony abnormality. No effusion or pneumothorax. Thoracic aortic enlargement, consistent with known dissection. IMPRESSION: No active cardiopulmonary disease. No apparent interval aortic enlargement or LEFT effusion. Electronically Signed   By: Staci Righter M.D.   On: 09/30/2017 17:59   Ct Angio Chest/abd/pel For Dissection W And/or Wo Contrast  Result Date: 09/30/2017 CLINICAL DATA:  62 year old male with back pain and abdominal pain. Nausea vomiting. Concern for aortic dissection. Known type B dissection. Recent ultrasound-guided biopsy of right lobe of the liver. EXAM: CT ANGIOGRAPHY CHEST, ABDOMEN AND PELVIS TECHNIQUE: Multidetector CT imaging through the chest, abdomen and pelvis was performed using the standard protocol during bolus administration of intravenous contrast. Multiplanar reconstructed images and MIPs were obtained and reviewed to evaluate the vascular anatomy. CONTRAST:  147m ISOVUE-370 IOPAMIDOL (ISOVUE-370) INJECTION 76% COMPARISON:  CT of the abdomen pelvis dated 09/20/2017 and 05/25/2017 FINDINGS: CTA  CHEST FINDINGS Cardiovascular: There is no cardiomegaly. Trace pericardial effusion similar to prior CT. Dissection flap origin ating in the aortic arch distal to the origin of the left subclavian artery consistent with known type B dissection and similar to prior CT. No aneurysmal dilatation. No change in the caliber of the aorta since the prior CT. No periaortic stranding or extravasation of contrast. The origins of the great vessels of the aortic arch are patent. There is no CT evidence of pulmonary embolism. Mediastinum/Nodes: No hilar or mediastinal adenopathy. The esophagus is grossly unremarkable. No mediastinal fluid collection or hematoma. Mild heterogeneity of the left lobe of the thyroid gland with probable small nodules. Ultrasound may provide better evaluation. Lungs/Pleura: There is a trace left pleural effusion. Minimal bibasilar dependent atelectasis as well as linear atelectasis/scarring at the right lung base. No focal consolidation, or pneumothorax. The central airways are patent. Musculoskeletal: No chest wall abnormality. No acute or significant osseous findings. Degenerative changes of the spine. Review of the MIP images confirms the above findings. CTA ABDOMEN AND PELVIS FINDINGS VASCULAR Aorta: Dissection flap extends along the entire length of the abdominal aorta. No aneurysmal dilatation. No change in the caliber of the aorta since the prior CT. No periaortic stranding or extravasation of contrast. Celiac: There is extension of the dissection flap into the celiac artery similar to prior CT. No change in the caliber of the celiac artery. The celiac artery and its branches remain patent. There is a replaced left hepatic artery from the left gastric artery. SMA: The SMA is patent and arises from the true lumen. There may be extension of the dissection flap to the origin of the SMA. Renals: The right renal artery is patent and arises from the true lumen. The left renal artery arises from the  false lumen. The left renal artery is occluded similar to prior CT. IMA: The IMA is patent. The dissection flap extends to the origin of the IMA similar to prior CT. Inflow: Dissection extends to the proximal left common iliac artery similar to prior CT and terminates there. The iliac arteries are patent bilaterally. Veins: No obvious venous abnormality within the limitations of this arterial phase study. Review of the MIP images confirms the above findings. NON-VASCULAR There is no intra-abdominal free air.  Trace subhepatic free fluid. Hepatobiliary: Multiple hepatic hypodense lesions suspicious for metastatic disease. These lesions measure up to 2.7 cm in segment IV B. there has been interval placement of a biliary stent with associated pneumobilia. Evaluation for stent patency is limited on the CT. Some soft tissue density noted within the mid to distal lumen  of the stent. The gallbladder is distended and contains multiple calcified stones. High attenuating content within the gallbladder likely vicarious excretion of contrast from recent procedure. There is mild diffuse gallbladder wall thickening and stranding which may be reactive. Clinical correlation is recommended. Pancreas: Ill-defined soft tissue density in the uncinate process of the pancreas is not well characterized. There is atrophy of the body and tail of the pancreas. No ductal dilatation. Spleen: Unremarkable. Adrenals/Urinary Tract: There is a 14 mm left adrenal nodule, indeterminate. The right adrenal gland is unremarkable. Infarcted and atrophic left kidney. Postsurgical changes related to partial right nephrectomy. There is no hydronephrosis on the right. The visualized ureters and urinary bladder appear unremarkable. Stomach/Bowel: Moderate stool throughout the colon. No bowel dilatation or evidence of obstruction. Lymphatic: Multiple peripancreatic, portacaval enlarged lymph nodes. Multiple mildly rounded retroperitoneal and para-aortic lymph  nodes. Reproductive: Mildly enlarged prostate gland measures approximately 5 cm in transverse diameter. Other: Anterior lower abdominal fat containing hernia similar prior CT. No inflammatory changes or fluid collection. Musculoskeletal: No acute or significant osseous findings. Review of the MIP images confirms the above findings. IMPRESSION: 1. Similar appearance of known type B dissection as the prior CT of 05/25/2017. No periaortic stranding, extraluminal contrast, or change in the aortic aortic caliber. Extension of the dissection flap into the celiac axis, origins of the SMA and IMA and occlusion of the left renal artery as seen previously. 2. Interval placement of a biliary stent. Apparent soft tissue density in the mid to distal lumen of the stent noted however, there is pneumobilia suggestive of stent patency. 3. Distended gallbladder with multiple stones and vicariously excreted contrast from prior imaging studies. There is mild thickened appearance of the gallbladder wall which may be reactive. Acute cholecystitis is not excluded. Clinical correlation is recommended. 4. Multiple hepatic metastatic disease. Recent biopsy of liver lesion. No large hematoma. 5. Ill-defined soft tissue in the uncinate process of the pancreas with atrophy of the distal gland. 6. Infarcted and atrophic left kidney and postsurgical changes of partial right nephrectomy. 7. Retroperitoneal, and periportal adenopathy. Electronically Signed   By: Anner Crete M.D.   On: 09/30/2017 21:37   US Abdomen Limited Ruq  Result Date: 09/30/2017 CLINICAL DATA:  Right upper quadrant abdominal pain. Status post right nephrectomy for renal cancer on 07/28/2017. Nausea, vomiting and obstructive jaundice. EXAM: ULTRASOUND ABDOMEN LIMITED RIGHT UPPER QUADRANT COMPARISON:  Abdomen and pelvis CT dated 09/20/2017 and abdomen MR dated 06/24/2017. FINDINGS: Gallbladder: Diffuse gallbladder wall thickening with a maximum thickness of 4.5 mm.  Sludge in the gallbladder. There is also a 1.2 cm shadowing stone in the gallbladder. There is a 2nd similar sized stone in the gallbladder on the recent CT. Minimal pericholecystic fluid. There was a positive sonographic Murphy sign. Common bile duct: Diameter: 3.8 mm Liver: Multiple masses of varying size and echogenicity. The largest measures 3.6 cm in maximum diameter. Normal liver echotexture. There is also evidence of pneumobilia in the left lobe of the liver. Portal vein is patent on color Doppler imaging with normal direction of blood flow towards the liver. IMPRESSION: 1. Cholelithiasis, sludge in the gallbladder, diffuse gallbladder wall thickening, minimal pericholecystic fluid and positive sonographic Murphy sign. This combination of findings is compatible with a diagnosis of acute cholecystitis. 2. Left lobe liver pneumobilia. 3. Multiple liver metastases. Electronically Signed   By: Claudie Revering M.D.   On: 09/30/2017 19:20        Scheduled Meds: . insulin aspart  0-9 Units  Subcutaneous Q4H   Continuous Infusions: . famotidine (PEPCID) IV 20 mg (10/01/17 0956)  . piperacillin-tazobactam (ZOSYN)  IV 3.375 g (10/01/17 0515)     LOS: 1 day    Time spent: 13 minutes    Edwin Dada, MD [facility] 10/01/2017, 11:47 AM     Pager [phone] --- please page though AMION:  [email or web address] If 7PM-7AM, please contact night-coverage

## 2017-10-01 NOTE — Progress Notes (Addendum)
Pt arrived to unit from ED. Dx--Acute Cholecystitis. Pt alert and oriented x4. Complained of abdominal pain, PRN Morphine given. Skin intact. Oriented to room and call bell.

## 2017-10-01 NOTE — Consult Note (Signed)
[facility] Gastroenterology Consultation Note  Referring Provider: Dr. Loleta Books [facility]) Primary Care Physician:  [name], MD  Reason for Consultation:  Abdominal pain  HPI: Jerry Weiss is a 62 y.o. male with suspected pancreatic cancer (liver biopsy pending?) with liver metastases with biliary obstruction with two prior ercps, last about one week ago.  Ultimately discharged, but 2 days ago (few days after last ERCP) patient began having escalating abdominal pain and some nausea/vomiting.  No fevers/chills.  No blood in stool.  Imaging showed thickened gallbladder wall with sonographic Murphy's; interval worsening leukocytosis.  Since admission, clear liquids, antibiotics, patient is feeling better, though his pain has not resolved.   Past Medical History:  Diagnosis Date  . Anemia    mild  . 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 GERD (gastroesophageal reflux disease)   . Hypertension   . Inguinal hernia    Bilateral age 10  . Leg muscle spasm    both legs occ  . OSA (obstructive sleep apnea)    Wears CPAP at night  . Pneumonia    history of x3  . Renal cancer, right ([place])   . 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 Renal mass, right   . Status post dilation of esophageal narrowing   . Umbilical hernia    Small noted on CT 05/26/2017  . Ventral hernia    noted on CT 05/26/2017    Past Surgical History:  Procedure Laterality Date  . BILIARY STENT PLACEMENT  09/21/2017   Procedure: BILIARY STENT PLACEMENT;  Surgeon: Clarene Essex, MD;  Location: [facility] ENDOSCOPY;  Service: Endoscopy;;  . BILIARY STENT PLACEMENT  09/23/2017   Procedure: BILIARY STENT PLACEMENT;  Surgeon: Clarene Essex, MD;  Location: [place];  Service: Endoscopy;;  . BRONCHIAL BRUSHINGS  09/21/2017   Procedure: BILIARY BRUSHINGS;  Surgeon: Clarene Essex, MD;  Location: [place];  Service: Endoscopy;;  . COLONOSCOPY    . ERCP N/A 09/21/2017   Procedure: ENDOSCOPIC RETROGRADE CHOLANGIOPANCREATOGRAPHY (ERCP);  Surgeon: Clarene Essex, MD;  Location: [place];  Service: Endoscopy;  Laterality: N/A;  . ERCP N/A 09/23/2017   Procedure: ENDOSCOPIC RETROGRADE CHOLANGIOPANCREATOGRAPHY (ERCP);  Surgeon: Clarene Essex, MD;  Location: [place];  Service: Endoscopy;  Laterality: N/A;  . HERNIA REPAIR Bilateral    AGE 29  . ROBOT ASSISTED LAPAROSCOPIC NEPHRECTOMY Right 07/28/2017   Procedure: XI ROBOTIC ASSISTED LAPAROSCOPIC PARTIAL NEPHRECTOMY;  Surgeon: Raynelle Bring, MD;  Location: [facility];  Service: Urology;  Laterality: Right;  . SPHINCTEROTOMY  09/21/2017   Procedure: SPHINCTEROTOMY;  Surgeon: Clarene Essex, MD;  Location: [facility] ENDOSCOPY;  Service: Endoscopy;;  . Bess Kinds CHOLANGIOSCOPY N/A 09/23/2017   Procedure: RDEYCXKG CHOLANGIOSCOPY;  Surgeon: Clarene Essex, MD;  Location: [place];  Service: Endoscopy;  Laterality: N/A;  . TONSILLECTOMY     age 55  . TRANSURETHRAL RESECTION OF PROSTATE    . TRANSURETHRAL RESECTION OF PROSTATE N/A 09/08/2017   Procedure: TRANSURETHRAL RESECTION OF THE PROSTATE (TURP);  Surgeon: Irine Seal, MD;  Location: [facility];  Service: Urology;  Laterality: N/A;  . UPPER GASTROINTESTINAL ENDOSCOPY      Prior to Admission medications   Medication Sig Start Date End Date Taking? Authorizing Provider  amLODipine (NORVASC) 10 MG tablet Take 10 mg by mouth daily.   Yes [provider]  gabapentin (NEURONTIN)  300 MG capsule Take 300 mg by mouth at bedtime.   Yes [provider]  hydrALAZINE (APRESOLINE) 100 MG tablet Take 100 mg by mouth 3 (three) times daily.   Yes [provider]  labetalol (NORMODYNE) 200 MG tablet Take 400 mg by mouth 3 (three) times daily.    Yes [provider]  lisinopril (PRINIVIL,ZESTRIL) 20 MG tablet Take 40 mg by mouth daily.    Yes [provider]   neomycin-polymyxin-dexameth (MAXITROL) 0.1 % OINT Place 1 application into both eyes daily as needed (eye irritation).   Yes [provider]  ondansetron (ZOFRAN) 4 MG tablet Take 4 mg by mouth 2 (two) times daily as needed for nausea/vomiting. 09/16/17  Yes [provider]  pantoprazole (PROTONIX) 20 MG tablet Take 20 mg by mouth daily.  07/11/16  Yes [provider]  pioglitazone (ACTOS) 15 MG tablet Take 15 mg by mouth daily.   Yes [provider]  polyethylene glycol (MIRALAX / GLYCOLAX) packet Take 17 g by mouth daily as needed for mild constipation or moderate constipation. 09/26/17  Yes Samuella Cota, MD  sitaGLIPtin (JANUVIA) 100 MG tablet Take 100 mg by mouth daily.   Yes [provider]    Current Facility-Administered Medications  Medication Dose Route Frequency Provider Last Rate Last Dose  . 0.9 %  sodium chloride infusion   Intravenous Continuous Opyd, Ilene Qua, MD 100 mL/hr at 10/01/17 0052    . acetaminophen (TYLENOL) tablet 650 mg  650 mg Oral Q6H PRN Opyd, Ilene Qua, MD       Or  . acetaminophen (TYLENOL) suppository 650 mg  650 mg Rectal Q6H PRN Opyd, Ilene Qua, MD      . famotidine (PEPCID) IVPB 20 mg premix  20 mg Intravenous Q12H Opyd, Ilene Qua, MD 100 mL/hr at 10/01/17 0956 20 mg at 10/01/17 0956  . hydrALAZINE (APRESOLINE) injection 10 mg  10 mg Intravenous Q4H PRN Opyd, Ilene Qua, MD      . insulin aspart (novoLOG) injection 0-9 Units  0-9 Units Subcutaneous Q4H Opyd, Ilene Qua, MD   1 Units at 10/01/17 0455  . morphine 4 MG/ML injection 2-4 mg  2-4 mg Intravenous Q4H PRN Opyd, Ilene Qua, MD   4 mg at 10/01/17 0455  . ondansetron (ZOFRAN) tablet 4 mg  4 mg Oral Q6H PRN Opyd, Ilene Qua, MD       Or  . ondansetron (ZOFRAN) injection 4 mg  4 mg Intravenous Q6H PRN Opyd, Ilene Qua, MD   4 mg at 10/01/17 0053  . piperacillin-tazobactam (ZOSYN) IVPB 3.375 g  3.375 g Intravenous Q8H Erenest Blank, RPH 12.5 mL/hr at 10/01/17  0515 3.375 g at 10/01/17 0515    Allergies as of 09/30/2017 - Review Complete 09/30/2017  Allergen Reaction Noted  . Ciprofloxacin Other (See Comments) 09/23/2017  . Codeine Other (See Comments) 07/15/2010  . Shellfish-derived products Swelling 07/15/2010    Family History  Problem Relation Age of Onset  . Allergies Mother   . Diabetes Mother   . Stroke Mother   . Skin cancer Mother   . Allergies Sister   . Asthma Daughter        childhood  . Asthma Father        developed a lot of medical problems after a fall  . Colon cancer Neg Hx   . Stomach cancer Neg Hx   . Rectal cancer Neg Hx   . Liver cancer Neg Hx   .  Esophageal cancer Neg Hx     Social History   Socioeconomic History  . Marital status: Widowed    Spouse name: Not on file  . Number of children: 4  . Years of education: Not on file  . Highest education level: Not on file  Occupational History  . Occupation: Training and development officer  Social Needs  . Financial resource strain: Not on file  . Food insecurity:    Worry: Not on file    Inability: Not on file  . Transportation needs:    Medical: Not on file    Non-medical: Not on file  Tobacco Use  . Smoking status: Never Smoker  . Smokeless tobacco: Never Used  Substance and Sexual Activity  . Alcohol use: No  . Drug use: No  . Sexual activity: Yes  Lifestyle  . Physical activity:    Days per week: Not on file    Minutes per session: Not on file  . Stress: Not on file  Relationships  . Social connections:    Talks on phone: Not on file    Gets together: Not on file    Attends religious service: Not on file    Active member of club or organization: Not on file    Attends meetings of clubs or organizations: Not on file    Relationship status: Not on file  . Intimate partner violence:    Fear of current or ex partner: Not on file    Emotionally abused: Not on file    Physically abused: Not on file    Forced sexual activity: Not on file  Other Topics Concern  .  Not on file  Social History Narrative  . Not on file    Review of Systems: As per HPI, all others negative  Physical Exam: Vital signs in last 24 hours: Temp:  [99 F (37.2 C)-99.4 F (37.4 C)] 99.4 F (37.4 C) (07/20 0008) Pulse Rate:  [70-93] 86 (07/20 0436) Resp:  [12-22] 20 (07/20 0436) BP: (141-197)/(70-90) 171/74 (07/20 0436) SpO2:  [90 %-100 %] 97 % (07/20 0436) Weight:  [76.8 kg (169 lb 5 oz)-78.9 kg (174 lb)] 76.8 kg (169 lb 5 oz) (07/20 0018) Last BM Date: 09/30/17 General:   Alert,  Well-developed, well-nourished, pleasant and cooperative in NAD Head:  Normocephalic and atraumatic. Eyes:  Sclera clear, no icterus.   Conjunctiva pink. Ears:  Normal auditory acuity. Nose:  No deformity, discharge,  or lesions. Mouth:  No deformity or lesions.  Oropharynx pink but dry Neck:  Supple; no masses or thyromegaly. Lungs:  Clear throughout to auscultation.   No wheezes, crackles, or rhonchi. No acute distress. Heart:  Regular rate and rhythm; no murmurs, clicks, rubs,  or gallop Abdomen:  Soft, mild generalized tenderness worse in RUQ; No masses, hepatosplenomegaly or hernias noted. Hypoactive bowel sounds, without guarding, and without rebound.     Msk:  Symmetrical without gross deformities. Normal posture. Pulses:  Normal pulses noted. Extremities:  Without clubbing or edema. Neurologic:  Alert and  oriented x4;  grossly normal neurologically. Skin:  Mild jaundiced, scattered ecchymoses, otherwise intact without significant lesions or rashes. Cervical Nodes:  No significant cervical adenopathy. Psych:  Alert and cooperative. Normal mood and affect.   Lab Results: Recent Labs    09/30/17 1949 10/01/17 0535  WBC 19.2* 21.7*  HGB 11.3* 10.2*  HCT 35.7* 32.8*  PLT 313 297   BMET Recent Labs    09/30/17 1715 10/01/17 0535  NA 140 137  K 4.3  3.4*  CL 105 104  CO2 24 24  GLUCOSE 132* 127*  BUN 12 11  CREATININE 1.24 1.05  CALCIUM 9.5 8.6*   LFT Recent Labs     10/01/17 0535  PROT 6.0*  ALBUMIN 2.8*  AST 28  ALT 39  ALKPHOS 265*  BILITOT 1.8*   PT/INR No results for input(s): LABPROT, INR in the last 72 hours.  Studies/Results: Dg Chest 2 View  Result Date: 09/30/2017 CLINICAL DATA:  Epigastric pain.  Known thoracic aneurysm. EXAM: CHEST - 2 VIEW COMPARISON:  CT chest 05/25/2017. FINDINGS: Normal heart size. Clear lung fields. No acute bony abnormality. No effusion or pneumothorax. Thoracic aortic enlargement, consistent with known dissection. IMPRESSION: No active cardiopulmonary disease. No apparent interval aortic enlargement or LEFT effusion. Electronically Signed   By: Staci Righter M.D.   On: 09/30/2017 17:59   Ct Angio Chest/abd/pel For Dissection W And/or Wo Contrast  Result Date: 09/30/2017 CLINICAL DATA:  62 year old male with back pain and abdominal pain. Nausea vomiting. Concern for aortic dissection. Known type B dissection. Recent ultrasound-guided biopsy of right lobe of the liver. EXAM: CT ANGIOGRAPHY CHEST, ABDOMEN AND PELVIS TECHNIQUE: Multidetector CT imaging through the chest, abdomen and pelvis was performed using the standard protocol during bolus administration of intravenous contrast. Multiplanar reconstructed images and MIPs were obtained and reviewed to evaluate the vascular anatomy. CONTRAST:  131m ISOVUE-370 IOPAMIDOL (ISOVUE-370) INJECTION 76% COMPARISON:  CT of the abdomen pelvis dated 09/20/2017 and 05/25/2017 FINDINGS: CTA CHEST FINDINGS Cardiovascular: There is no cardiomegaly. Trace pericardial effusion similar to prior CT. Dissection flap origin ating in the aortic arch distal to the origin of the left subclavian artery consistent with known type B dissection and similar to prior CT. No aneurysmal dilatation. No change in the caliber of the aorta since the prior CT. No periaortic stranding or extravasation of contrast. The origins of the great vessels of the aortic arch are patent. There is no CT evidence of  pulmonary embolism. Mediastinum/Nodes: No hilar or mediastinal adenopathy. The esophagus is grossly unremarkable. No mediastinal fluid collection or hematoma. Mild heterogeneity of the left lobe of the thyroid gland with probable small nodules. Ultrasound may provide better evaluation. Lungs/Pleura: There is a trace left pleural effusion. Minimal bibasilar dependent atelectasis as well as linear atelectasis/scarring at the right lung base. No focal consolidation, or pneumothorax. The central airways are patent. Musculoskeletal: No chest wall abnormality. No acute or significant osseous findings. Degenerative changes of the spine. Review of the MIP images confirms the above findings. CTA ABDOMEN AND PELVIS FINDINGS VASCULAR Aorta: Dissection flap extends along the entire length of the abdominal aorta. No aneurysmal dilatation. No change in the caliber of the aorta since the prior CT. No periaortic stranding or extravasation of contrast. Celiac: There is extension of the dissection flap into the celiac artery similar to prior CT. No change in the caliber of the celiac artery. The celiac artery and its branches remain patent. There is a replaced left hepatic artery from the left gastric artery. SMA: The SMA is patent and arises from the true lumen. There may be extension of the dissection flap to the origin of the SMA. Renals: The right renal artery is patent and arises from the true lumen. The left renal artery arises from the false lumen. The left renal artery is occluded similar to prior CT. IMA: The IMA is patent. The dissection flap extends to the origin of the IMA similar to prior CT. Inflow: Dissection extends to the proximal  left common iliac artery similar to prior CT and terminates there. The iliac arteries are patent bilaterally. Veins: No obvious venous abnormality within the limitations of this arterial phase study. Review of the MIP images confirms the above findings. NON-VASCULAR There is no  intra-abdominal free air.  Trace subhepatic free fluid. Hepatobiliary: Multiple hepatic hypodense lesions suspicious for metastatic disease. These lesions measure up to 2.7 cm in segment IV B. there has been interval placement of a biliary stent with associated pneumobilia. Evaluation for stent patency is limited on the CT. Some soft tissue density noted within the mid to distal lumen of the stent. The gallbladder is distended and contains multiple calcified stones. High attenuating content within the gallbladder likely vicarious excretion of contrast from recent procedure. There is mild diffuse gallbladder wall thickening and stranding which may be reactive. Clinical correlation is recommended. Pancreas: Ill-defined soft tissue density in the uncinate process of the pancreas is not well characterized. There is atrophy of the body and tail of the pancreas. No ductal dilatation. Spleen: Unremarkable. Adrenals/Urinary Tract: There is a 14 mm left adrenal nodule, indeterminate. The right adrenal gland is unremarkable. Infarcted and atrophic left kidney. Postsurgical changes related to partial right nephrectomy. There is no hydronephrosis on the right. The visualized ureters and urinary bladder appear unremarkable. Stomach/Bowel: Moderate stool throughout the colon. No bowel dilatation or evidence of obstruction. Lymphatic: Multiple peripancreatic, portacaval enlarged lymph nodes. Multiple mildly rounded retroperitoneal and para-aortic lymph nodes. Reproductive: Mildly enlarged prostate gland measures approximately 5 cm in transverse diameter. Other: Anterior lower abdominal fat containing hernia similar prior CT. No inflammatory changes or fluid collection. Musculoskeletal: No acute or significant osseous findings. Review of the MIP images confirms the above findings. IMPRESSION: 1. Similar appearance of known type B dissection as the prior CT of 05/25/2017. No periaortic stranding, extraluminal contrast, or change in  the aortic aortic caliber. Extension of the dissection flap into the celiac axis, origins of the SMA and IMA and occlusion of the left renal artery as seen previously. 2. Interval placement of a biliary stent. Apparent soft tissue density in the mid to distal lumen of the stent noted however, there is pneumobilia suggestive of stent patency. 3. Distended gallbladder with multiple stones and vicariously excreted contrast from prior imaging studies. There is mild thickened appearance of the gallbladder wall which may be reactive. Acute cholecystitis is not excluded. Clinical correlation is recommended. 4. Multiple hepatic metastatic disease. Recent biopsy of liver lesion. No large hematoma. 5. Ill-defined soft tissue in the uncinate process of the pancreas with atrophy of the distal gland. 6. Infarcted and atrophic left kidney and postsurgical changes of partial right nephrectomy. 7. Retroperitoneal, and periportal adenopathy. Electronically Signed   By: Anner Crete M.D.   On: 09/30/2017 21:37   US Abdomen Limited Ruq  Result Date: 09/30/2017 CLINICAL DATA:  Right upper quadrant abdominal pain. Status post right nephrectomy for renal cancer on 07/28/2017. Nausea, vomiting and obstructive jaundice. EXAM: ULTRASOUND ABDOMEN LIMITED RIGHT UPPER QUADRANT COMPARISON:  Abdomen and pelvis CT dated 09/20/2017 and abdomen MR dated 06/24/2017. FINDINGS: Gallbladder: Diffuse gallbladder wall thickening with a maximum thickness of 4.5 mm. Sludge in the gallbladder. There is also a 1.2 cm shadowing stone in the gallbladder. There is a 2nd similar sized stone in the gallbladder on the recent CT. Minimal pericholecystic fluid. There was a positive sonographic Murphy sign. Common bile duct: Diameter: 3.8 mm Liver: Multiple masses of varying size and echogenicity. The largest measures 3.6 cm in maximum  diameter. Normal liver echotexture. There is also evidence of pneumobilia in the left lobe of the liver. Portal vein is  patent on color Doppler imaging with normal direction of blood flow towards the liver. IMPRESSION: 1. Cholelithiasis, sludge in the gallbladder, diffuse gallbladder wall thickening, minimal pericholecystic fluid and positive sonographic Murphy sign. This combination of findings is compatible with a diagnosis of acute cholecystitis. 2. Left lobe liver pneumobilia. 3. Multiple liver metastases. Electronically Signed   By: Claudie Revering M.D.   On: 09/30/2017 19:20    Impression:  1.  Suspected pancreatic cancer with liver metastases. 2.  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 3.  Recurrent abdominal pain and leukocytosis post ERCP; suspected cholecystitis from cystic duct occlusion from biliary stent; given improvement LFTs post-ERCP and lack of significant biliary dilatation at this time, doubt significant component of cholangitis.  Plan:  1.  Supportive management with IV fluids and antibiotics. 2.  I do not see indication for repeat ERCP; there is no way to remove the biliary stent that's causing the cystic duct occlusion (it has internally migrated) and given improvement in LFTs and lack of biliary dilatation I don't think there is significant in-stent luminal occlusion. 3.  Further management of cholecystitis (diet, intervention planning) per surgical team. 4.  [facility] will follow.   LOS: 1 day   Nakeya Adinolfi M  10/01/2017, 10:22 AM  Cell [phone] If no answer or after 5 PM call [phone]

## 2017-10-02 ENCOUNTER — Other Ambulatory Visit: Payer: Self-pay

## 2017-10-02 LAB — CBC
HCT: 28 % — ABNORMAL LOW (ref 39.0–52.0)
HEMOGLOBIN: 8.8 g/dL — AB (ref 13.0–17.0)
MCH: 29.7 pg (ref 26.0–34.0)
MCHC: 31.4 g/dL (ref 30.0–36.0)
MCV: 94.6 fL (ref 78.0–100.0)
Platelets: 240 10*3/uL (ref 150–400)
RBC: 2.96 MIL/uL — AB (ref 4.22–5.81)
RDW: 15.1 % (ref 11.5–15.5)
WBC: 17.1 10*3/uL — AB (ref 4.0–10.5)

## 2017-10-02 LAB — GLUCOSE, CAPILLARY
GLUCOSE-CAPILLARY: 102 mg/dL — AB (ref 70–99)
GLUCOSE-CAPILLARY: 114 mg/dL — AB (ref 70–99)
GLUCOSE-CAPILLARY: 151 mg/dL — AB (ref 70–99)
GLUCOSE-CAPILLARY: 86 mg/dL (ref 70–99)
Glucose-Capillary: 100 mg/dL — ABNORMAL HIGH (ref 70–99)
Glucose-Capillary: 104 mg/dL — ABNORMAL HIGH (ref 70–99)
Glucose-Capillary: 169 mg/dL — ABNORMAL HIGH (ref 70–99)

## 2017-10-02 LAB — COMPREHENSIVE METABOLIC PANEL
ALBUMIN: 2.3 g/dL — AB (ref 3.5–5.0)
ALK PHOS: 191 U/L — AB (ref 38–126)
ALT: 28 U/L (ref 0–44)
AST: 23 U/L (ref 15–41)
Anion gap: 9 (ref 5–15)
BUN: 15 mg/dL (ref 8–23)
CO2: 24 mmol/L (ref 22–32)
CREATININE: 1.42 mg/dL — AB (ref 0.61–1.24)
Calcium: 8 mg/dL — ABNORMAL LOW (ref 8.9–10.3)
Chloride: 104 mmol/L (ref 98–111)
GFR calc non Af Amer: 51 mL/min — ABNORMAL LOW (ref >60)
GFR, EST AFRICAN AMERICAN: 60 mL/min — AB (ref >60)
GLUCOSE: 97 mg/dL (ref 70–99)
Potassium: 3.4 mmol/L — ABNORMAL LOW (ref 3.5–5.1)
SODIUM: 137 mmol/L (ref 135–145)
TOTAL PROTEIN: 5.3 g/dL — AB (ref 6.5–8.1)
Total Bilirubin: 1.4 mg/dL — ABNORMAL HIGH (ref 0.3–1.2)

## 2017-10-02 LAB — SURGICAL PCR SCREEN
MRSA, PCR: NEGATIVE
STAPHYLOCOCCUS AUREUS: NEGATIVE

## 2017-10-02 MED ORDER — ORAL CARE MOUTH RINSE
15.0000 mL | Freq: Two times a day (BID) | OROMUCOSAL | Status: DC
  Administered 2017-10-03 – 2017-10-13 (×13): 15 mL via OROMUCOSAL

## 2017-10-02 MED ORDER — LISINOPRIL 40 MG PO TABS: 40.0000 mg | ORAL_TABLET | Freq: Every day | ORAL | Status: DC

## 2017-10-02 NOTE — Progress Notes (Signed)
PROGRESS NOTE    Jerry Weiss  CHE:[id number] DOB: 1955/11/04 DOA: 09/30/2017 PCP: [name], MD      Brief Narrative:  Jerry Weiss is a 62 y.o. 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  Found also to have new onset DM and incidental RIGHT renal mass at that time.  Dissection treated medically w/ hydral, labetalol, lisionopril, amlodipine has been stable, followed by Dr. Donzetta Matters here. Underwent robotic partial Right nephrectomy on 07/28/17.   Then earlier this month, admitted with painless jaundice, found to have biliary obstruction, liver masses -- ERCP done, stent placed, symptoms resolved. **NOTE** CT guided liver biopsy is documented 7/11, but no surgical pathology specimen is listed in Epic.  Patient was home for about 5 days since this episode until developed back and abdominal pain day of admission with sever vomiting.  CT showed known liver masses and peripancreatic adenopathy.  US showed signs of cholecystitis.    Assessment & Plan:  Acute cholecystitis  No active chest pain, palpitations, orthopnea, dyspnea.  RCRI 1, -Continue Zosyn -Clears, NPO at midnight -Continue oxycodone for pain,  -Continue hydromorphone 1 mg q4hrs PRN for breakthrough -Consult general surgery, appreciate cares   Biliary obstruction s/p stent    CBD stent placed earlier this month, stent has now migrated and is causing cystic duct occlusion; GI doubt stent occlusion/stenosis.  -Consult Gastroenterology, appreciate cares  Liver mass  Status-post biopsy by IR on 7/11 **NOTE** CT guided liver biopsy is documented 7/11, but no surgical pathology specimen is listed in Epic.   Hypertension  BP improved. -Continue amlodipine, lisinopril, hydralazine, labetalol -Hold lisinopril tomorrow  morning -PRN labetalol for worse hypertension    Type II DM  A1c was 6.8% in June, glucoses excellent. -Hold ACtos, Januvia -SSI corrections     Anemia Mild, normocytic          DVT prophylaxis: SCD Code Status: FULL Family Communication: None present MDM and disposition Plan: Below labs and imaging reports were reviewed and summarized above.  Outside records were reviewed and summarized below.  The patient was admitted with worsening abdominal pain, found to have cholecystitis.  He is on IV antibiotics, general surgery plan for cholecystectomy tomorrow.  Afterwards we will have a PT evaluation, and likely home with home health.      Consultants:   Gen Surg  GI  Procedures:   None  Antimicrobials:   Zosyn 7/19 >>    Subjective: Pain improved today.  No more vomiting.  He has no fever or confusion or chest pain.  Objective: Vitals:   10/01/17 1358 10/01/17 2052 10/01/17 2300 10/02/17 0537  BP: 133/70 (!) 119/59    Pulse: 75 72    Resp: 17     Temp: 99.2 F (37.3 C) (!) 100.6 F (38.1 C) 99.8 F (37.7 C)   TempSrc: Oral Oral Oral   SpO2: 99% 94%    Weight:    79.6 kg (175 lb 7.8 oz)  Height:        Intake/Output Summary (Last 24 hours) at 10/02/2017 1458 Last data filed at 10/02/2017 1034 Gross per 24 hour  Intake 577 ml  Output -  Net 577 ml   Filed Weights   09/30/17 1708 10/01/17 0018 10/02/17 0537  Weight: 78.9 kg (174 lb) 76.8 kg (169 lb 5 oz) 79.6  kg (175 lb 7.8 oz)    Examination: General appearance: Thin adult male, lying in bed, no acute distress, interactive, tired. HEENT: Icteric, conjunctive and lids and lashes normal.  No nasal discharge, or epistaxis.  Oral mucosa tacky dry, no oral lesions, hearing normal, lips and teeth normal.   Skin:, Mild jaundice, no suspicious rashes or lesions. Cardiac: Rate and rhythm, no murmurs appreciated, no lower extremity edema Respiratory: Normal respiratory rate and rhythm, lungs clear without  rales or wheezes. Abdomen: Abdomen soft with right upper quadrant tenderness, no rebound.  There is moderate right upper quadrant guarding. MSK: No deformities or effusions. Neuro: Awake and alert, extraocular movements intact, moves all extremities, speech fluent Psych: Sensorium intact responding to questions, attention normal, affect normal, judgment and insight appear normal.    Data Reviewed: I have personally reviewed following labs and imaging studies:  CBC: Recent Labs  Lab 09/26/17 0328 09/30/17 1949 10/01/17 0535 10/02/17 0744  WBC 9.4 19.2* 21.7* 17.1*  NEUTROABS  --  16.6* 18.7*  --   HGB 9.4* 11.3* 10.2* 8.8*  HCT 29.9* 35.7* 32.8* 28.0*  MCV 94.9 94.2 95.1 94.6  PLT 271 313 297 341   Basic Metabolic Panel: Recent Labs  Lab 09/26/17 0328 09/30/17 1715 10/01/17 0535 10/02/17 0744  NA 138 140 137 137  K 3.6 4.3 3.4* 3.4*  CL 104 105 104 104  CO2 26 24 24 24   GLUCOSE 117* 132* 127* 97  BUN 20 12 11 15   CREATININE 1.27* 1.24 1.05 1.42*  CALCIUM 8.6* 9.5 8.6* 8.0*  MG 1.5*  --   --   --    GFR: Estimated Creatinine Clearance: 54.5 mL/min (A) (by C-G formula based on SCr of 1.42 mg/dL (H)). Liver Function Tests: Recent Labs  Lab 09/26/17 0328 09/30/17 1715 10/01/17 0535 10/02/17 0744  AST 41 37 28 23  ALT 71* 51* 39 28  ALKPHOS 265* 335* 265* 191*  BILITOT 2.0* 1.8* 1.8* 1.4*  PROT 5.8* 7.0 6.0* 5.3*  ALBUMIN 2.8* 3.4* 2.8* 2.3*   Recent Labs  Lab 09/30/17 1715 10/01/17 0535  LIPASE 21 20   No results for input(s): AMMONIA in the last 168 hours. Coagulation Profile: No results for input(s): INR, PROTIME in the last 168 hours. Cardiac Enzymes: No results for input(s): CKTOTAL, CKMB, CKMBINDEX, TROPONINI in the last 168 hours. BNP (last 3 results) No results for input(s): PROBNP in the last 8760 hours. HbA1C: No results for input(s): HGBA1C in the last 72 hours. CBG: Recent Labs  Lab 10/01/17 2055 10/02/17 0016 10/02/17 0404  10/02/17 0838 10/02/17 1247  GLUCAP 123* 114* 104* 86 102*   Lipid Profile: No results for input(s): CHOL, HDL, LDLCALC, TRIG, CHOLHDL, LDLDIRECT in the last 72 hours. Thyroid Function Tests: No results for input(s): TSH, T4TOTAL, FREET4, T3FREE, THYROIDAB in the last 72 hours. Anemia Panel: No results for input(s): VITAMINB12, FOLATE, FERRITIN, TIBC, IRON, RETICCTPCT in the last 72 hours. Urine analysis:    Component Value Date/Time   COLORURINE YELLOW 09/30/2017 2106   APPEARANCEUR CLEAR 09/30/2017 2106   LABSPEC 1.016 09/30/2017 2106   PHURINE 5.0 09/30/2017 2106   GLUCOSEU NEGATIVE 09/30/2017 2106   HGBUR SMALL (A) 09/30/2017 2106   [place] NEGATIVE 09/30/2017 2106   [place] NEGATIVE 09/30/2017 2106   PROTEINUR 30 (A) 09/30/2017 2106   NITRITE NEGATIVE 09/30/2017 2106   LEUKOCYTESUR SMALL (A) 09/30/2017 2106   Sepsis Labs: @LABRCNTIP (procalcitonin:4,lacticacidven:4)  )No results found for this or any previous visit (from the past 240  hour(s)).       Radiology Studies: Dg Chest 2 View  Result Date: 09/30/2017 CLINICAL DATA:  Epigastric pain.  Known thoracic aneurysm. EXAM: CHEST - 2 VIEW COMPARISON:  CT chest 05/25/2017. FINDINGS: Normal heart size. Clear lung fields. No acute bony abnormality. No effusion or pneumothorax. Thoracic aortic enlargement, consistent with known dissection. IMPRESSION: No active cardiopulmonary disease. No apparent interval aortic enlargement or LEFT effusion. Electronically Signed   By: Staci Righter M.D.   On: 09/30/2017 17:59   Ct Angio Chest/abd/pel For Dissection W And/or Wo Contrast  Result Date: 09/30/2017 CLINICAL DATA:  62 year old male with back pain and abdominal pain. Nausea vomiting. Concern for aortic dissection. Known type B dissection. Recent ultrasound-guided biopsy of right lobe of the liver. EXAM: CT ANGIOGRAPHY CHEST, ABDOMEN AND PELVIS TECHNIQUE: Multidetector CT imaging through the chest, abdomen and pelvis was  performed using the standard protocol during bolus administration of intravenous contrast. Multiplanar reconstructed images and MIPs were obtained and reviewed to evaluate the vascular anatomy. CONTRAST:  136m ISOVUE-370 IOPAMIDOL (ISOVUE-370) INJECTION 76% COMPARISON:  CT of the abdomen pelvis dated 09/20/2017 and 05/25/2017 FINDINGS: CTA CHEST FINDINGS Cardiovascular: There is no cardiomegaly. Trace pericardial effusion similar to prior CT. Dissection flap origin ating in the aortic arch distal to the origin of the left subclavian artery consistent with known type B dissection and similar to prior CT. No aneurysmal dilatation. No change in the caliber of the aorta since the prior CT. No periaortic stranding or extravasation of contrast. The origins of the great vessels of the aortic arch are patent. There is no CT evidence of pulmonary embolism. Mediastinum/Nodes: No hilar or mediastinal adenopathy. The esophagus is grossly unremarkable. No mediastinal fluid collection or hematoma. Mild heterogeneity of the left lobe of the thyroid gland with probable small nodules. Ultrasound may provide better evaluation. Lungs/Pleura: There is a trace left pleural effusion. Minimal bibasilar dependent atelectasis as well as linear atelectasis/scarring at the right lung base. No focal consolidation, or pneumothorax. The central airways are patent. Musculoskeletal: No chest wall abnormality. No acute or significant osseous findings. Degenerative changes of the spine. Review of the MIP images confirms the above findings. CTA ABDOMEN AND PELVIS FINDINGS VASCULAR Aorta: Dissection flap extends along the entire length of the abdominal aorta. No aneurysmal dilatation. No change in the caliber of the aorta since the prior CT. No periaortic stranding or extravasation of contrast. Celiac: There is extension of the dissection flap into the celiac artery similar to prior CT. No change in the caliber of the celiac artery. The celiac artery  and its branches remain patent. There is a replaced left hepatic artery from the left gastric artery. SMA: The SMA is patent and arises from the true lumen. There may be extension of the dissection flap to the origin of the SMA. Renals: The right renal artery is patent and arises from the true lumen. The left renal artery arises from the false lumen. The left renal artery is occluded similar to prior CT. IMA: The IMA is patent. The dissection flap extends to the origin of the IMA similar to prior CT. Inflow: Dissection extends to the proximal left common iliac artery similar to prior CT and terminates there. The iliac arteries are patent bilaterally. Veins: No obvious venous abnormality within the limitations of this arterial phase study. Review of the MIP images confirms the above findings. NON-VASCULAR There is no intra-abdominal free air.  Trace subhepatic free fluid. Hepatobiliary: Multiple hepatic hypodense lesions suspicious for metastatic disease.  These lesions measure up to 2.7 cm in segment IV B. there has been interval placement of a biliary stent with associated pneumobilia. Evaluation for stent patency is limited on the CT. Some soft tissue density noted within the mid to distal lumen of the stent. The gallbladder is distended and contains multiple calcified stones. High attenuating content within the gallbladder likely vicarious excretion of contrast from recent procedure. There is mild diffuse gallbladder wall thickening and stranding which may be reactive. Clinical correlation is recommended. Pancreas: Ill-defined soft tissue density in the uncinate process of the pancreas is not well characterized. There is atrophy of the body and tail of the pancreas. No ductal dilatation. Spleen: Unremarkable. Adrenals/Urinary Tract: There is a 14 mm left adrenal nodule, indeterminate. The right adrenal gland is unremarkable. Infarcted and atrophic left kidney. Postsurgical changes related to partial right  nephrectomy. There is no hydronephrosis on the right. The visualized ureters and urinary bladder appear unremarkable. Stomach/Bowel: Moderate stool throughout the colon. No bowel dilatation or evidence of obstruction. Lymphatic: Multiple peripancreatic, portacaval enlarged lymph nodes. Multiple mildly rounded retroperitoneal and para-aortic lymph nodes. Reproductive: Mildly enlarged prostate gland measures approximately 5 cm in transverse diameter. Other: Anterior lower abdominal fat containing hernia similar prior CT. No inflammatory changes or fluid collection. Musculoskeletal: No acute or significant osseous findings. Review of the MIP images confirms the above findings. IMPRESSION: 1. Similar appearance of known type B dissection as the prior CT of 05/25/2017. No periaortic stranding, extraluminal contrast, or change in the aortic aortic caliber. Extension of the dissection flap into the celiac axis, origins of the SMA and IMA and occlusion of the left renal artery as seen previously. 2. Interval placement of a biliary stent. Apparent soft tissue density in the mid to distal lumen of the stent noted however, there is pneumobilia suggestive of stent patency. 3. Distended gallbladder with multiple stones and vicariously excreted contrast from prior imaging studies. There is mild thickened appearance of the gallbladder wall which may be reactive. Acute cholecystitis is not excluded. Clinical correlation is recommended. 4. Multiple hepatic metastatic disease. Recent biopsy of liver lesion. No large hematoma. 5. Ill-defined soft tissue in the uncinate process of the pancreas with atrophy of the distal gland. 6. Infarcted and atrophic left kidney and postsurgical changes of partial right nephrectomy. 7. Retroperitoneal, and periportal adenopathy. Electronically Signed   By: Anner Crete M.D.   On: 09/30/2017 21:37   US Abdomen Limited Ruq  Result Date: 09/30/2017 CLINICAL DATA:  Right upper quadrant abdominal  pain. Status post right nephrectomy for renal cancer on 07/28/2017. Nausea, vomiting and obstructive jaundice. EXAM: ULTRASOUND ABDOMEN LIMITED RIGHT UPPER QUADRANT COMPARISON:  Abdomen and pelvis CT dated 09/20/2017 and abdomen MR dated 06/24/2017. FINDINGS: Gallbladder: Diffuse gallbladder wall thickening with a maximum thickness of 4.5 mm. Sludge in the gallbladder. There is also a 1.2 cm shadowing stone in the gallbladder. There is a 2nd similar sized stone in the gallbladder on the recent CT. Minimal pericholecystic fluid. There was a positive sonographic Murphy sign. Common bile duct: Diameter: 3.8 mm Liver: Multiple masses of varying size and echogenicity. The largest measures 3.6 cm in maximum diameter. Normal liver echotexture. There is also evidence of pneumobilia in the left lobe of the liver. Portal vein is patent on color Doppler imaging with normal direction of blood flow towards the liver. IMPRESSION: 1. Cholelithiasis, sludge in the gallbladder, diffuse gallbladder wall thickening, minimal pericholecystic fluid and positive sonographic Murphy sign. This combination of findings is compatible with  a diagnosis of acute cholecystitis. 2. Left lobe liver pneumobilia. 3. Multiple liver metastases. Electronically Signed   By: Claudie Revering M.D.   On: 09/30/2017 19:20        Scheduled Meds: . amLODipine  10 mg Oral Daily  . hydrALAZINE  100 mg Oral TID  . insulin aspart  0-9 Units Subcutaneous Q4H  . labetalol  400 mg Oral TID  . lisinopril  40 mg Oral Daily   Continuous Infusions: . famotidine (PEPCID) IV 20 mg (10/02/17 0949)  . piperacillin-tazobactam (ZOSYN)  IV 3.375 g (10/02/17 0645)     LOS: 2 days    Time spent: 25 minutes    Edwin Dada, MD [facility] 10/02/2017, 2:58 PM     Pager [phone] --- please page though AMION:  [email or web address] If 7PM-7AM, please contact night-coverage

## 2017-10-02 NOTE — Progress Notes (Signed)
Patient ID: Jerry Weiss, male   DOB: 01-Apr-1955, 62 y.o.   MRN: [id number]     Subjective: Feels better than admission but still with right upper quadrant abdominal pain.  No new complaints.  Objective: Vital signs in last 24 hours: Temp:  [99.2 F (37.3 C)-100.6 F (38.1 C)] 99.8 F (37.7 C) (07/20 2300) Pulse Rate:  [72-75] 72 (07/20 2052) Resp:  [17] 17 (07/20 1358) BP: (119-133)/(59-70) 119/59 (07/20 2052) SpO2:  [94 %-99 %] 94 % (07/20 2052) Weight:  [79.6 kg (175 lb 7.8 oz)] 79.6 kg (175 lb 7.8 oz) (07/21 0537) Last BM Date: 09/30/17  Intake/Output from previous day: No intake/output data recorded. Intake/Output this shift: No intake/output data recorded.  General appearance: alert, cooperative and no distress Resp: clear to auscultation bilaterally GI: Localized right upper quadrant tenderness with guarding.  Well-healed laparoscopic incisions without hernias.  Lab Results:  Recent Labs    10/01/17 0535 10/02/17 0744  WBC 21.7* 17.1*  HGB 10.2* 8.8*  HCT 32.8* 28.0*  PLT 297 240   BMET Recent Labs    09/30/17 1715 10/01/17 0535  NA 140 137  K 4.3 3.4*  CL 105 104  CO2 24 24  GLUCOSE 132* 127*  BUN 12 11  CREATININE 1.24 1.05  CALCIUM 9.5 8.6*     Studies/Results: Dg Chest 2 View  Result Date: 09/30/2017 CLINICAL DATA:  Epigastric pain.  Known thoracic aneurysm. EXAM: CHEST - 2 VIEW COMPARISON:  CT chest 05/25/2017. FINDINGS: Normal heart size. Clear lung fields. No acute bony abnormality. No effusion or pneumothorax. Thoracic aortic enlargement, consistent with known dissection. IMPRESSION: No active cardiopulmonary disease. No apparent interval aortic enlargement or LEFT effusion. Electronically Signed   By: Staci Righter M.D.   On: 09/30/2017 17:59   Ct Angio Chest/abd/pel For Dissection W And/or Wo Contrast  Result Date: 09/30/2017 CLINICAL DATA:  62 year old male with back pain and abdominal pain. Nausea vomiting. Concern for aortic dissection.  Known type B dissection. Recent ultrasound-guided biopsy of right lobe of the liver. EXAM: CT ANGIOGRAPHY CHEST, ABDOMEN AND PELVIS TECHNIQUE: Multidetector CT imaging through the chest, abdomen and pelvis was performed using the standard protocol during bolus administration of intravenous contrast. Multiplanar reconstructed images and MIPs were obtained and reviewed to evaluate the vascular anatomy. CONTRAST:  142m ISOVUE-370 IOPAMIDOL (ISOVUE-370) INJECTION 76% COMPARISON:  CT of the abdomen pelvis dated 09/20/2017 and 05/25/2017 FINDINGS: CTA CHEST FINDINGS Cardiovascular: There is no cardiomegaly. Trace pericardial effusion similar to prior CT. Dissection flap origin ating in the aortic arch distal to the origin of the left subclavian artery consistent with known type B dissection and similar to prior CT. No aneurysmal dilatation. No change in the caliber of the aorta since the prior CT. No periaortic stranding or extravasation of contrast. The origins of the great vessels of the aortic arch are patent. There is no CT evidence of pulmonary embolism. Mediastinum/Nodes: No hilar or mediastinal adenopathy. The esophagus is grossly unremarkable. No mediastinal fluid collection or hematoma. Mild heterogeneity of the left lobe of the thyroid gland with probable small nodules. Ultrasound may provide better evaluation. Lungs/Pleura: There is a trace left pleural effusion. Minimal bibasilar dependent atelectasis as well as linear atelectasis/scarring at the right lung base. No focal consolidation, or pneumothorax. The central airways are patent. Musculoskeletal: No chest wall abnormality. No acute or significant osseous findings. Degenerative changes of the spine. Review of the MIP images confirms the above findings. CTA ABDOMEN AND PELVIS FINDINGS VASCULAR Aorta: Dissection flap extends  along the entire length of the abdominal aorta. No aneurysmal dilatation. No change in the caliber of the aorta since the prior CT. No  periaortic stranding or extravasation of contrast. Celiac: There is extension of the dissection flap into the celiac artery similar to prior CT. No change in the caliber of the celiac artery. The celiac artery and its branches remain patent. There is a replaced left hepatic artery from the left gastric artery. SMA: The SMA is patent and arises from the true lumen. There may be extension of the dissection flap to the origin of the SMA. Renals: The right renal artery is patent and arises from the true lumen. The left renal artery arises from the false lumen. The left renal artery is occluded similar to prior CT. IMA: The IMA is patent. The dissection flap extends to the origin of the IMA similar to prior CT. Inflow: Dissection extends to the proximal left common iliac artery similar to prior CT and terminates there. The iliac arteries are patent bilaterally. Veins: No obvious venous abnormality within the limitations of this arterial phase study. Review of the MIP images confirms the above findings. NON-VASCULAR There is no intra-abdominal free air.  Trace subhepatic free fluid. Hepatobiliary: Multiple hepatic hypodense lesions suspicious for metastatic disease. These lesions measure up to 2.7 cm in segment IV B. there has been interval placement of a biliary stent with associated pneumobilia. Evaluation for stent patency is limited on the CT. Some soft tissue density noted within the mid to distal lumen of the stent. The gallbladder is distended and contains multiple calcified stones. High attenuating content within the gallbladder likely vicarious excretion of contrast from recent procedure. There is mild diffuse gallbladder wall thickening and stranding which may be reactive. Clinical correlation is recommended. Pancreas: Ill-defined soft tissue density in the uncinate process of the pancreas is not well characterized. There is atrophy of the body and tail of the pancreas. No ductal dilatation. Spleen:  Unremarkable. Adrenals/Urinary Tract: There is a 14 mm left adrenal nodule, indeterminate. The right adrenal gland is unremarkable. Infarcted and atrophic left kidney. Postsurgical changes related to partial right nephrectomy. There is no hydronephrosis on the right. The visualized ureters and urinary bladder appear unremarkable. Stomach/Bowel: Moderate stool throughout the colon. No bowel dilatation or evidence of obstruction. Lymphatic: Multiple peripancreatic, portacaval enlarged lymph nodes. Multiple mildly rounded retroperitoneal and para-aortic lymph nodes. Reproductive: Mildly enlarged prostate gland measures approximately 5 cm in transverse diameter. Other: Anterior lower abdominal fat containing hernia similar prior CT. No inflammatory changes or fluid collection. Musculoskeletal: No acute or significant osseous findings. Review of the MIP images confirms the above findings. IMPRESSION: 1. Similar appearance of known type B dissection as the prior CT of 05/25/2017. No periaortic stranding, extraluminal contrast, or change in the aortic aortic caliber. Extension of the dissection flap into the celiac axis, origins of the SMA and IMA and occlusion of the left renal artery as seen previously. 2. Interval placement of a biliary stent. Apparent soft tissue density in the mid to distal lumen of the stent noted however, there is pneumobilia suggestive of stent patency. 3. Distended gallbladder with multiple stones and vicariously excreted contrast from prior imaging studies. There is mild thickened appearance of the gallbladder wall which may be reactive. Acute cholecystitis is not excluded. Clinical correlation is recommended. 4. Multiple hepatic metastatic disease. Recent biopsy of liver lesion. No large hematoma. 5. Ill-defined soft tissue in the uncinate process of the pancreas with atrophy of the distal gland.  6. Infarcted and atrophic left kidney and postsurgical changes of partial right nephrectomy. 7.  Retroperitoneal, and periportal adenopathy. Electronically Signed   By: Anner Crete M.D.   On: 09/30/2017 21:37   US Abdomen Limited Ruq  Result Date: 09/30/2017 CLINICAL DATA:  Right upper quadrant abdominal pain. Status post right nephrectomy for renal cancer on 07/28/2017. Nausea, vomiting and obstructive jaundice. EXAM: ULTRASOUND ABDOMEN LIMITED RIGHT UPPER QUADRANT COMPARISON:  Abdomen and pelvis CT dated 09/20/2017 and abdomen MR dated 06/24/2017. FINDINGS: Gallbladder: Diffuse gallbladder wall thickening with a maximum thickness of 4.5 mm. Sludge in the gallbladder. There is also a 1.2 cm shadowing stone in the gallbladder. There is a 2nd similar sized stone in the gallbladder on the recent CT. Minimal pericholecystic fluid. There was a positive sonographic Murphy sign. Common bile duct: Diameter: 3.8 mm Liver: Multiple masses of varying size and echogenicity. The largest measures 3.6 cm in maximum diameter. Normal liver echotexture. There is also evidence of pneumobilia in the left lobe of the liver. Portal vein is patent on color Doppler imaging with normal direction of blood flow towards the liver. IMPRESSION: 1. Cholelithiasis, sludge in the gallbladder, diffuse gallbladder wall thickening, minimal pericholecystic fluid and positive sonographic Murphy sign. This combination of findings is compatible with a diagnosis of acute cholecystitis. 2. Left lobe liver pneumobilia. 3. Multiple liver metastases. Electronically Signed   By: Claudie Revering M.D.   On: 09/30/2017 19:20    Anti-infectives: Anti-infectives (From admission, onward)   Start     Dose/Rate Route Frequency Ordered Stop   10/01/17 0600  piperacillin-tazobactam (ZOSYN) IVPB 3.375 g     3.375 g 12.5 mL/hr over 240 Minutes Intravenous Every 8 hours 09/30/17 2332     09/30/17 2200  piperacillin-tazobactam (ZOSYN) IVPB 3.375 g     3.375 g 100 mL/hr over 30 Minutes Intravenous  Once 09/30/17 2152 09/30/17 2240       Assessment/Plan: Cholelithiasis and acute cholecystitis.  Complex surgical history with fairly recent aortic dissection, right nephrectomy for renal cell cancer, obstructive jaundice status post stent placement with stent migration.  Migrated stent possibly contributing to acute cholecystitis with obstruction of cystic duct but revision not possible per GI.  Liver metastases apparent on ultrasound, status post biopsy, path pending. Patient will require cholecystectomy.  I discussed the situation and recommendations in detail with the patient.I discussed the procedure in detail.   We discussed the risks and benefits of a laparoscopic cholecystectomy and possible cholangiogram including, but not limited to bleeding, infection, injury to surrounding structures such as the intestine or liver, bile leak, retained gallstones, need to convert to an open procedure, prolonged diarrhea, blood clots such as  DVT, common bile duct injury, anesthesia risks, and possible need for additional procedures.  The likelihood of improvement in symptoms and return to the patient's normal status is good. We discussed the typical post-operative recovery course.  He understands and agrees to proceed.    LOS: 2 days    Edward Jolly 10/02/2017

## 2017-10-03 ENCOUNTER — Encounter (HOSPITAL_COMMUNITY)
Admission: EM | Disposition: A | Discharge status: Home / Self Care | Payer: Self-pay | Source: Home / Self Care | Attending: Internal Medicine

## 2017-10-03 ENCOUNTER — Inpatient Hospital Stay (HOSPITAL_COMMUNITY): Payer: BLUE CROSS/BLUE SHIELD | Admitting: Anesthesiology

## 2017-10-03 ENCOUNTER — Encounter (HOSPITAL_COMMUNITY): Payer: Self-pay

## 2017-10-03 DIAGNOSIS — C801 Malignant (primary) neoplasm, unspecified: Secondary | ICD-10-CM | POA: Diagnosis not present

## 2017-10-03 DIAGNOSIS — K8012 Calculus of gallbladder with acute and chronic cholecystitis without obstruction: Secondary | ICD-10-CM | POA: Diagnosis not present

## 2017-10-03 DIAGNOSIS — C772 Secondary and unspecified malignant neoplasm of intra-abdominal lymph nodes: Secondary | ICD-10-CM | POA: Diagnosis not present

## 2017-10-03 HISTORY — PX: CHOLECYSTECTOMY: SHX55

## 2017-10-03 LAB — CBC
HEMATOCRIT: 29 % — AB (ref 39.0–52.0)
HEMOGLOBIN: 9 g/dL — AB (ref 13.0–17.0)
MCH: 29.3 pg (ref 26.0–34.0)
MCHC: 31 g/dL (ref 30.0–36.0)
MCV: 94.5 fL (ref 78.0–100.0)
Platelets: 262 10*3/uL (ref 150–400)
RBC: 3.07 MIL/uL — ABNORMAL LOW (ref 4.22–5.81)
RDW: 14.6 % (ref 11.5–15.5)
WBC: 14.9 10*3/uL — AB (ref 4.0–10.5)

## 2017-10-03 LAB — BASIC METABOLIC PANEL
ANION GAP: 9 (ref 5–15)
BUN: 13 mg/dL (ref 8–23)
CHLORIDE: 102 mmol/L (ref 98–111)
CO2: 26 mmol/L (ref 22–32)
Calcium: 8.3 mg/dL — ABNORMAL LOW (ref 8.9–10.3)
Creatinine, Ser: 1.4 mg/dL — ABNORMAL HIGH (ref 0.61–1.24)
GFR calc Af Amer: >60 mL/min (ref >60)
GFR calc non Af Amer: 52 mL/min — ABNORMAL LOW (ref >60)
Glucose, Bld: 102 mg/dL — ABNORMAL HIGH (ref 70–99)
POTASSIUM: 3.5 mmol/L (ref 3.5–5.1)
SODIUM: 137 mmol/L (ref 135–145)

## 2017-10-03 LAB — GLUCOSE, CAPILLARY
GLUCOSE-CAPILLARY: 102 mg/dL — AB (ref 70–99)
GLUCOSE-CAPILLARY: 178 mg/dL — AB (ref 70–99)
Glucose-Capillary: 99 mg/dL (ref 70–99)
Glucose-Capillary: 100 mg/dL — ABNORMAL HIGH (ref 70–99)
Glucose-Capillary: 156 mg/dL — ABNORMAL HIGH (ref 70–99)
Glucose-Capillary: 146 mg/dL — ABNORMAL HIGH (ref 70–99)

## 2017-10-03 SURGERY — LAPAROSCOPIC CHOLECYSTECTOMY
Anesthesia: General | Site: Abdomen

## 2017-10-03 MED ORDER — ROCURONIUM BROMIDE 100 MG/10ML IV SOLN
INTRAVENOUS | Status: DC | PRN
  Administered 2017-10-03: 40 mg via INTRAVENOUS
  Administered 2017-10-03: 10 mg via INTRAVENOUS

## 2017-10-03 MED ORDER — DIPHENHYDRAMINE HCL 50 MG/ML IJ SOLN
25.0000 mg | Freq: Four times a day (QID) | INTRAMUSCULAR | Status: DC | PRN
  Administered 2017-10-08: 25 mg via INTRAVENOUS
  Filled 2017-10-03: qty 1

## 2017-10-03 MED ORDER — ENOXAPARIN SODIUM 40 MG/0.4ML ~~LOC~~ SOLN: 40.0000 mg | SUBCUTANEOUS | Status: DC

## 2017-10-03 MED ORDER — BUPIVACAINE-EPINEPHRINE (PF) 0.5% -1:200000 IJ SOLN
INTRAMUSCULAR | Status: AC
  Filled 2017-10-03: qty 30

## 2017-10-03 MED ORDER — FENTANYL CITRATE (PF) 250 MCG/5ML IJ SOLN
INTRAMUSCULAR | Status: AC
  Filled 2017-10-03: qty 5

## 2017-10-03 MED ORDER — DEXAMETHASONE SODIUM PHOSPHATE 10 MG/ML IJ SOLN
INTRAMUSCULAR | Status: DC | PRN
  Administered 2017-10-03: 8 mg via INTRAVENOUS

## 2017-10-03 MED ORDER — DEXAMETHASONE SODIUM PHOSPHATE 10 MG/ML IJ SOLN
INTRAMUSCULAR | Status: AC
  Filled 2017-10-03: qty 1

## 2017-10-03 MED ORDER — ONDANSETRON HCL 4 MG/2ML IJ SOLN
INTRAMUSCULAR | Status: AC
  Filled 2017-10-03: qty 2

## 2017-10-03 MED ORDER — AMLODIPINE BESYLATE 5 MG PO TABS
10.0000 mg | ORAL_TABLET | Freq: Every day | ORAL | Status: DC
Start: 2017-10-04 — End: 2017-10-14
  Administered 2017-10-04 – 2017-10-14 (×10): 10 mg via ORAL
  Filled 2017-10-03 (×11): qty 2

## 2017-10-03 MED ORDER — PHENYLEPHRINE HCL 10 MG/ML IJ SOLN
INTRAMUSCULAR | Status: DC | PRN
  Administered 2017-10-03 (×3): 40 ug via INTRAVENOUS

## 2017-10-03 MED ORDER — PROMETHAZINE HCL 25 MG/ML IJ SOLN: 6.2500 mg | INTRAMUSCULAR | Status: DC | PRN

## 2017-10-03 MED ORDER — MEPERIDINE HCL 50 MG/ML IJ SOLN: 6.2500 mg | INTRAMUSCULAR | Status: DC | PRN

## 2017-10-03 MED ORDER — MIDAZOLAM HCL 5 MG/5ML IJ SOLN
INTRAMUSCULAR | Status: DC | PRN
  Administered 2017-10-03: 2 mg via INTRAVENOUS

## 2017-10-03 MED ORDER — PROPOFOL 10 MG/ML IV BOLUS
INTRAVENOUS | Status: DC | PRN
  Administered 2017-10-03: 140 mg via INTRAVENOUS

## 2017-10-03 MED ORDER — HYDROCODONE-ACETAMINOPHEN 7.5-325 MG PO TABS: 1.0000 | ORAL_TABLET | Freq: Once | ORAL | Status: DC | PRN

## 2017-10-03 MED ORDER — MIDAZOLAM HCL 2 MG/2ML IJ SOLN
INTRAMUSCULAR | Status: AC
  Filled 2017-10-03: qty 2

## 2017-10-03 MED ORDER — IOPAMIDOL (ISOVUE-300) INJECTION 61%
INTRAVENOUS | Status: AC
  Filled 2017-10-03: qty 50

## 2017-10-03 MED ORDER — SUGAMMADEX SODIUM 200 MG/2ML IV SOLN
INTRAVENOUS | Status: AC
  Filled 2017-10-03: qty 2

## 2017-10-03 MED ORDER — SODIUM CHLORIDE 0.9 % IR SOLN
Status: DC | PRN
  Administered 2017-10-03: 1000 mL

## 2017-10-03 MED ORDER — BUPIVACAINE-EPINEPHRINE 0.5% -1:200000 IJ SOLN
INTRAMUSCULAR | Status: DC | PRN
  Administered 2017-10-03: 9 mL

## 2017-10-03 MED ORDER — BUPIVACAINE-EPINEPHRINE (PF) 0.25% -1:200000 IJ SOLN
INTRAMUSCULAR | Status: AC
  Filled 2017-10-03: qty 30

## 2017-10-03 MED ORDER — LISINOPRIL 40 MG PO TABS
40.0000 mg | ORAL_TABLET | Freq: Every day | ORAL | Status: DC
  Administered 2017-10-04 – 2017-10-07 (×4): 40 mg via ORAL
  Filled 2017-10-03 (×4): qty 1

## 2017-10-03 MED ORDER — LIDOCAINE HCL (CARDIAC) PF 100 MG/5ML IV SOSY
PREFILLED_SYRINGE | INTRAVENOUS | Status: DC | PRN
  Administered 2017-10-03: 100 mg via INTRAVENOUS

## 2017-10-03 MED ORDER — PIPERACILLIN-TAZOBACTAM 3.375 G IVPB
3.3750 g | Freq: Three times a day (TID) | INTRAVENOUS | Status: AC
  Administered 2017-10-03 – 2017-10-04 (×3): 3.375 g via INTRAVENOUS
  Filled 2017-10-03 (×3): qty 50

## 2017-10-03 MED ORDER — PHENYLEPHRINE 40 MCG/ML (10ML) SYRINGE FOR IV PUSH (FOR BLOOD PRESSURE SUPPORT)
PREFILLED_SYRINGE | INTRAVENOUS | Status: AC
  Filled 2017-10-03: qty 10

## 2017-10-03 MED ORDER — ACETAMINOPHEN 10 MG/ML IV SOLN: 1000.0000 mg | Freq: Once | INTRAVENOUS | Status: DC | PRN

## 2017-10-03 MED ORDER — FENTANYL CITRATE (PF) 100 MCG/2ML IJ SOLN
INTRAMUSCULAR | Status: DC | PRN
  Administered 2017-10-03 (×2): 25 ug via INTRAVENOUS
  Administered 2017-10-03: 50 ug via INTRAVENOUS
  Administered 2017-10-03: 100 ug via INTRAVENOUS

## 2017-10-03 MED ORDER — SUGAMMADEX SODIUM 200 MG/2ML IV SOLN
INTRAVENOUS | Status: DC | PRN
  Administered 2017-10-03 (×3): 50 mg via INTRAVENOUS

## 2017-10-03 MED ORDER — DIPHENHYDRAMINE HCL 25 MG PO CAPS: 25.0000 mg | ORAL_CAPSULE | Freq: Four times a day (QID) | ORAL | Status: DC | PRN

## 2017-10-03 MED ORDER — HYDROMORPHONE HCL 1 MG/ML IJ SOLN
INTRAMUSCULAR | Status: AC
  Filled 2017-10-03: qty 1

## 2017-10-03 MED ORDER — LIDOCAINE 2% (20 MG/ML) 5 ML SYRINGE
INTRAMUSCULAR | Status: AC
  Filled 2017-10-03: qty 5

## 2017-10-03 MED ORDER — HYDROMORPHONE HCL 1 MG/ML IJ SOLN
0.2500 mg | INTRAMUSCULAR | Status: DC | PRN
  Administered 2017-10-03 (×2): 0.5 mg via INTRAVENOUS

## 2017-10-03 MED ORDER — LACTATED RINGERS IV SOLN
INTRAVENOUS | Status: DC
  Administered 2017-10-03 – 2017-10-06 (×4): via INTRAVENOUS

## 2017-10-03 MED ORDER — ENOXAPARIN SODIUM 40 MG/0.4ML ~~LOC~~ SOLN
40.0000 mg | SUBCUTANEOUS | Status: DC
  Administered 2017-10-04 – 2017-10-13 (×9): 40 mg via SUBCUTANEOUS
  Filled 2017-10-03 (×10): qty 0.4

## 2017-10-03 MED ORDER — SUCCINYLCHOLINE CHLORIDE 200 MG/10ML IV SOSY
PREFILLED_SYRINGE | INTRAVENOUS | Status: AC
  Filled 2017-10-03: qty 10

## 2017-10-03 MED ORDER — 0.9 % SODIUM CHLORIDE (POUR BTL) OPTIME
TOPICAL | Status: DC | PRN
  Administered 2017-10-03: 1000 mL

## 2017-10-03 MED ORDER — PROPOFOL 10 MG/ML IV BOLUS
INTRAVENOUS | Status: AC
  Filled 2017-10-03: qty 20

## 2017-10-03 MED ORDER — LABETALOL HCL 100 MG PO TABS
400.0000 mg | ORAL_TABLET | Freq: Three times a day (TID) | ORAL | Status: DC
Start: 2017-10-03 — End: 2017-10-14
  Administered 2017-10-03 – 2017-10-14 (×32): 400 mg via ORAL
  Filled 2017-10-03 (×34): qty 4

## 2017-10-03 SURGICAL SUPPLY — 50 items
APPLIER CLIP ROT 10 11.4 M/L (STAPLE) ×3
BIOPATCH RED 1 DISK 7.0 (GAUZE/BANDAGES/DRESSINGS) ×3 IMPLANT
BLADE CLIPPER SURG (BLADE) IMPLANT
CANISTER SUCT 3000ML PPV (MISCELLANEOUS) ×3 IMPLANT
CHLORAPREP W/TINT 26ML (MISCELLANEOUS) ×3 IMPLANT
CLIP APPLIE ROT 10 11.4 M/L (STAPLE) ×2 IMPLANT
COVER MAYO STAND STRL (DRAPES) ×3 IMPLANT
COVER SURGICAL LIGHT HANDLE (MISCELLANEOUS) ×3 IMPLANT
DERMABOND ADVANCED (GAUZE/BANDAGES/DRESSINGS) ×1
DERMABOND ADVANCED .7 DNX12 (GAUZE/BANDAGES/DRESSINGS) ×2 IMPLANT
DEVICE TROCAR PUNCTURE CLOSURE (ENDOMECHANICALS) ×3 IMPLANT
DRAIN CHANNEL 19F RND (DRAIN) ×3 IMPLANT
DRAPE C-ARM 42X72 X-RAY (DRAPES) ×3 IMPLANT
DRSG TEGADERM 4X4.75 (GAUZE/BANDAGES/DRESSINGS) ×3 IMPLANT
ELECT REM PT RETURN 9FT ADLT (ELECTROSURGICAL) ×3
ELECTRODE REM PT RTRN 9FT ADLT (ELECTROSURGICAL) ×2 IMPLANT
ENDOLOOP SUT PDS II  0 18 (SUTURE) ×2
ENDOLOOP SUT PDS II 0 18 (SUTURE) ×4 IMPLANT
EVACUATOR SILICONE 100CC (DRAIN) ×3 IMPLANT
FILTER SMOKE EVAC LAPAROSHD (FILTER) ×3 IMPLANT
GLOVE BIOGEL PI IND STRL 8 (GLOVE) ×2 IMPLANT
GLOVE BIOGEL PI INDICATOR 8 (GLOVE) ×1
GLOVE ECLIPSE 7.5 STRL STRAW (GLOVE) ×3 IMPLANT
GOWN STRL REUS W/ TWL LRG LVL3 (GOWN DISPOSABLE) ×4 IMPLANT
GOWN STRL REUS W/ TWL XL LVL3 (GOWN DISPOSABLE) ×2 IMPLANT
GOWN STRL REUS W/TWL LRG LVL3 (GOWN DISPOSABLE) ×2
GOWN STRL REUS W/TWL XL LVL3 (GOWN DISPOSABLE) ×1
KIT BASIN OR (CUSTOM PROCEDURE TRAY) ×3 IMPLANT
KIT TURNOVER KIT B (KITS) ×3 IMPLANT
NS IRRIG 1000ML POUR BTL (IV SOLUTION) ×3 IMPLANT
PAD ARMBOARD 7.5X6 YLW CONV (MISCELLANEOUS) ×3 IMPLANT
POUCH RETRIEVAL ECOSAC 10 (ENDOMECHANICALS) ×2 IMPLANT
POUCH RETRIEVAL ECOSAC 10MM (ENDOMECHANICALS) ×1
SCISSORS LAP 5X35 DISP (ENDOMECHANICALS) ×3 IMPLANT
SET CHOLANGIOGRAPH 5 50 .035 (SET/KITS/TRAYS/PACK) ×3 IMPLANT
SET IRRIG TUBING LAPAROSCOPIC (IRRIGATION / IRRIGATOR) ×3 IMPLANT
SLEEVE ENDOPATH XCEL 5M (ENDOMECHANICALS) ×9 IMPLANT
SPECIMEN JAR SMALL (MISCELLANEOUS) ×3 IMPLANT
SUT ETHILON 2 0 FS 18 (SUTURE) ×3 IMPLANT
SUT MON AB 5-0 PS2 18 (SUTURE) ×3 IMPLANT
SUT NOVA NAB GS-21 0 18 T12 DT (SUTURE) ×3 IMPLANT
TOWEL OR 17X24 6PK STRL BLUE (TOWEL DISPOSABLE) ×3 IMPLANT
TOWEL OR 17X26 10 PK STRL BLUE (TOWEL DISPOSABLE) ×3 IMPLANT
TRAY LAPAROSCOPIC MC (CUSTOM PROCEDURE TRAY) ×3 IMPLANT
TROCAR BLADELESS 11MM (ENDOMECHANICALS) ×3 IMPLANT
TROCAR XCEL BLUNT TIP 100MML (ENDOMECHANICALS) ×3 IMPLANT
TROCAR XCEL NON-BLD 11X100MML (ENDOMECHANICALS) ×3 IMPLANT
TROCAR XCEL NON-BLD 5MMX100MML (ENDOMECHANICALS) ×3 IMPLANT
TUBING INSUFFLATION (TUBING) ×3 IMPLANT
WATER STERILE IRR 1000ML POUR (IV SOLUTION) ×3 IMPLANT

## 2017-10-03 NOTE — Op Note (Signed)
Preoperative Diagnosis: Cholangitis [K83.09] Cholecystitis [K81.9] RUQ pain [R10.11]  Postoprative Diagnosis: Cholangitis [K83.09] Cholecystitis [K81.9] RUQ pain [R10.11] Epigastric hernia  Procedure: Procedure(s): LAPAROSCOPIC CHOLECYSTECTOMY Suture repair of epigastric hernia   Surgeon: Excell Seltzer T   Assistants: None  Anesthesia:  General endotracheal anesthesia  Indications: Patient is an unfortunate 62 year old male status post laparoscopic right nephrectomy for renal cell cancer and subsequently presented with obstructive jaundice and ultrasound showing liver metastases.  He has had a biliary stent placed.  Liver metastasis shows malignant cells but the source has not yet been confirmed.  He then developed fairly acute severe right upper quadrant abdominal pain with localized tenderness and ultrasound showing thickened gallbladder wall with stones and sludge.  In addition he has biliary stent has migrated and appears to be occluding the cystic duct.  GI states the repeat ERCP or manipulation of the stent is not possible.  I recommend proceeding with laparoscopic cholecystectomy.  We have discussed the nature of his problem and indications for surgery, risks of anesthetic complications, bleeding, infection, visceral injury and he agrees to proceed.    Procedure Detail: Patient was brought to the operating room, placed in the supine position on the operating table, and general endotracheal anesthesia induced.  He was already on broad-spectrum IV antibiotics.  The abdomen was widely sterilely prepped and draped.  PAS were in place.  Patient timeout was performed and correct procedure verified.  There was a significant palpable mass above the umbilicus consistent with a chronically incarcerated hernia.  His gallbladder was palpable.  I elected to access the abdomen with a 5 mm Optiview trocar in the left upper quadrant done without difficulty.  There was no evidence of visceral  injury.  Pneumoperitoneum was established.  There was chronically incarcerated omentum within an epigastric hernia.  This needed to be reduced for exposure.  A second 5 mm trocar was placed in the left mid abdomen.  Using cautery and scissor cautery and traction large portion of omentum was reduced down from the hernia defect and cleared from the anterior abdominal wall.  An 11 mm trocar was placed at this site.  5 mm trochars were placed in the right upper quadrant and right lateral abdomen.  A few omental adhesions from his previous nephrectomy trocar sites were taken down with scissor cautery.  Finally an 11 mm trocar was placed subxiphoid.  Omentum was peeled back from the gallbladder.  Noted were several approximately 2 cm apparent liver metastases.  It was adherent with inflammatory adhesions.  The gallbladder was distended with severe subacute inflammation and probably early gangrenous changes.  It was decompressed with a needle aspirator.  The fundus was then grasped and elevated up over the liver.  The infundibulum was grasped and retracted inferolaterally.  There were somewhat chronic appearing inflammatory adhesions of the duodenum which were carefully taken down with mostly blunt dissection.  The distal gallbladder was exposed.  Peritoneum anterior and posterior to the distal gallbladder was incised and with careful mostly blunt dissection the distal gallbladder was dissected.  A large cystic duct node was identified over the top of the cystic artery and this was removed for exposure and was sent for permanent pathology.  The cystic artery was clearly identified upon the gallbladder wall was divided between 2 proximal and one distal clip.  The distal gallbladder was further dissected and the cystic duct and cystic duct gallbladder junction identified.  The cystic duct was a somewhat large structure as might be expected with his  history of common bile duct obstruction.  I did dissect the distal  gallbladder off the cholecystic plate and the anatomy was confirmed.  The cystic duct was chronically inflamed, short and thick and I elected not to try cholangiogram.  I divided the cystic duct sharply at the gallbladder junction and the cystic duct was then securely closed with a 2-0 PDS Endoloop.  The distal gallbladder was also secured with an Endoloop.  The gallbladder was then dissected free from its bed using hook cautery.  It was placed in an eco-catch bag and then brought out through the supraumbilical trocar site/hernia site after decompression.  The abdomen was thoroughly irrigated and complete hemostasis assured.  I inspected the left upper quadrant to rule out any evidence of trocar injury and everything looked fine.  I then performed a suture closure of his supraumbilical hernia using an Endo Close and 0 Novafil sutures.  A 19 Blake drain was left in the gallbladder fossa in Morison's pouch and brought out through lateral trocar site.  All CO2 was evacuated and trochars removed.  Skin incisions were closed with subcuticular Monocryl and Dermabond.  Sponge needle and instrument counts were correct.    Findings: Multiple liver metastases.  Severe subacute and acute cholecystitis.  Chronically incarcerated supraumbilical hernia.  Estimated Blood Loss:  less than 100 mL         Drains: 19 round Blake subhepatic  Blood Given: none          Specimens: #1 cystic duct lymph node   #2 gallbladder and contents        Complications:  * No complications entered in OR log *         Disposition: PACU - hemodynamically stable.         Condition: stable

## 2017-10-03 NOTE — Progress Notes (Signed)
PROGRESS NOTE    Jerry Weiss  RCV:[id number] DOB: 05/19/1955 DOA: 09/30/2017 PCP: [name], MD      Brief Narrative:  Jerry Weiss is a 62 y.o. 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  Found also to have new onset DM and incidental RIGHT renal mass at that time.  Dissection treated medically w/ hydral, labetalol, lisionopril, amlodipine has been stable, followed by Dr. Donzetta Matters here. Underwent robotic partial Right nephrectomy on 07/28/17.   Then earlier this month, admitted with painless jaundice, found to have biliary obstruction, liver masses -- ERCP done, stent placed, symptoms resolved. **NOTE** CT guided liver biopsy is documented 7/11, but no surgical pathology specimen is listed in Epic.  Patient was home for about 5 days since this episode until developed back and abdominal pain day of admission with sever vomiting.  CT showed known liver masses and peripancreatic adenopathy.  US showed signs of cholecystitis.      Assessment & Plan:  Acute cholecystitis  Post-op this afternoon from laparoscopic cholecystectomy.   -Continue Zosyn for 24 hrs then stop -Advance diet per Gen Surg -Continue oxycodone for pain,  -Consult general surgery, appreciate cares   Biliary obstruction s/p stent    CBD stent placed earlier this month, stent has now migrated and is causing cystic duct occlusion; GI doubt stent occlusion/stenosis.  -Consult Gastroenterology, appreciate cares  Liver mass, presumed metastases Status-post biopsy by IR on 7/11 **NOTE** CT guided liver biopsy is documented 7/11, but no surgical pathology specimen is listed in Epic.   Hypertension  BP improved. -Continue amlodipine, lisinopril, hydralazine, labetalol  -PRN labetalol for worse hypertension     -Trend Cr post-op  Type II DM  A1c was 6.8% in June, glucoses excellent. -Hold Actos, Januvia -Sliding scale corrections   Anemia Mild, normocytic -Trend Hgb post-op         DVT prophylaxis: Lovenox Code Status: FULL Family Communication: None present MDM and disposition Plan: The below labs and imaging reports were reviewed and summarized above, medication adjustments as above.  The patient was admitted with worsening abdominal pain, found to have cholecystitis.  He was treated with IV antibiotics, and cholecystectomy today.  Uneventfully.  PT evaluation, advance diet, likely home in 2 to 3 days.      Consultants:   Gen Surg  GI  Procedures:   None  Antimicrobials:   Zosyn 7/19 >>    Subjective: Seen post-operatively, still sedated.  No complications intra-operatively per notes, nursing.  Patient rousing from sedation as expected.    Objective: Vitals:   10/03/17 1405 10/03/17 1407 10/03/17 1408 10/03/17 1447  BP:  133/63  (!) 146/69  Pulse:  69 71 71  Resp:  14 16 14   Temp: 98.4 F (36.9 C)   98.2 F (36.8 C)  TempSrc:    Oral  SpO2:  95% 95% 92%  Weight:      Height:        Intake/Output Summary (Last 24 hours) at 10/03/2017 1524 Last data filed at 10/03/2017 1424 Gross per 24 hour  Intake 1612.03 ml  Output 220 ml  Net 1392.03 ml   Filed Weights   10/01/17 0018 10/02/17 0537 10/03/17 0436  Weight: 76.8 kg (169 lb 5 oz) 79.6 kg (175 lb 7.8 oz) 80 kg (176 lb 5.9 oz)  Examination: General appearance: Thin male, lying in bed, somewhat sedated, still trying to wake up.  No acute distress. HEENT: Scleral icterus noted, conjunctive and lids and lashes normal.  No nasal discharge, or epistaxis.  Oral mucosa dry, no oral lesions, hearing normal, lips and teeth normal. Skin: Warm and dry, mild jaundice, no suspicious rashes. Cardiac: Regular rate and rhythm, no murmurs appreciated, no lower extremity edema. Respiratory: Normal respiratory  rate and rhythm, lungs clear without rales or wheezes. Abdomen: Right JP drain, abdomen soft, mild tenderness, nonfocal guarding. MSK: No deformities or effusions. Neuro: Awake but sedated.  EOMI, makes brief eye contact, then falls asleep.  Appears globally weak.  Face symmetric. Psych: Seadted.    Data Reviewed: I have personally reviewed following labs and imaging studies:  CBC: Recent Labs  Lab 09/30/17 1949 10/01/17 0535 10/02/17 0744 10/03/17 0538  WBC 19.2* 21.7* 17.1* 14.9*  NEUTROABS 16.6* 18.7*  --   --   HGB 11.3* 10.2* 8.8* 9.0*  HCT 35.7* 32.8* 28.0* 29.0*  MCV 94.2 95.1 94.6 94.5  PLT 313 297 240 347   Basic Metabolic Panel: Recent Labs  Lab 09/30/17 1715 10/01/17 0535 10/02/17 0744 10/03/17 0538  NA 140 137 137 137  K 4.3 3.4* 3.4* 3.5  CL 105 104 104 102  CO2 24 24 24 26   GLUCOSE 132* 127* 97 102*  BUN 12 11 15 13   CREATININE 1.24 1.05 1.42* 1.40*  CALCIUM 9.5 8.6* 8.0* 8.3*   GFR: Estimated Creatinine Clearance: 55.5 mL/min (A) (by C-G formula based on SCr of 1.4 mg/dL (H)). Liver Function Tests: Recent Labs  Lab 09/30/17 1715 10/01/17 0535 10/02/17 0744  AST 37 28 23  ALT 51* 39 28  ALKPHOS 335* 265* 191*  BILITOT 1.8* 1.8* 1.4*  PROT 7.0 6.0* 5.3*  ALBUMIN 3.4* 2.8* 2.3*   Recent Labs  Lab 09/30/17 1715 10/01/17 0535  LIPASE 21 20   No results for input(s): AMMONIA in the last 168 hours. Coagulation Profile: No results for input(s): INR, PROTIME in the last 168 hours. Cardiac Enzymes: No results for input(s): CKTOTAL, CKMB, CKMBINDEX, TROPONINI in the last 168 hours. BNP (last 3 results) No results for input(s): PROBNP in the last 8760 hours. HbA1C: No results for input(s): HGBA1C in the last 72 hours. CBG: Recent Labs  Lab 10/02/17 2358 10/03/17 0222 10/03/17 0432 10/03/17 0820 10/03/17 1235  GLUCAP 100* 99 102* 100* 156*   Lipid Profile: No results for input(s): CHOL, HDL, LDLCALC, TRIG, CHOLHDL, LDLDIRECT in the  last 72 hours. Thyroid Function Tests: No results for input(s): TSH, T4TOTAL, FREET4, T3FREE, THYROIDAB in the last 72 hours. Anemia Panel: No results for input(s): VITAMINB12, FOLATE, FERRITIN, TIBC, IRON, RETICCTPCT in the last 72 hours. Urine analysis:    Component Value Date/Time   COLORURINE YELLOW 09/30/2017 2106   APPEARANCEUR CLEAR 09/30/2017 2106   LABSPEC 1.016 09/30/2017 2106   PHURINE 5.0 09/30/2017 2106   GLUCOSEU NEGATIVE 09/30/2017 2106   HGBUR SMALL (A) 09/30/2017 2106   [place] NEGATIVE 09/30/2017 2106   [place] NEGATIVE 09/30/2017 2106   PROTEINUR 30 (A) 09/30/2017 2106   NITRITE NEGATIVE 09/30/2017 2106   LEUKOCYTESUR SMALL (A) 09/30/2017 2106   Sepsis Labs: @LABRCNTIP (procalcitonin:4,lacticacidven:4)  ) Recent Results (from the past 240 hour(s))  Culture, blood (routine x 2)     Status: None (Preliminary result)   Collection Time: 09/30/17 11:45 PM  Result Value Ref Range Status   Specimen Description BLOOD LEFT ARM  Final   Special Requests  Final    BOTTLES DRAWN AEROBIC AND ANAEROBIC Blood Culture adequate volume   Culture   Final    NO GROWTH 2 DAYS Performed at [facility], [place] [address], [place], [place] [address]    Report Status PENDING  Incomplete  Culture, blood (routine x 2)     Status: None (Preliminary result)   Collection Time: 10/01/17 12:00 AM  Result Value Ref Range Status   Specimen Description BLOOD LEFT HAND  Final   Special Requests   Final    BOTTLES DRAWN AEROBIC ONLY Blood Culture adequate volume   Culture   Final    NO GROWTH 2 DAYS Performed at [facility], [place] [address] [place], [place] [address]    Report Status PENDING  Incomplete  Surgical pcr screen     Status: None   Collection Time: 10/02/17  5:04 PM  Result Value Ref Range Status   MRSA, PCR NEGATIVE NEGATIVE Final   Staphylococcus aureus NEGATIVE NEGATIVE Final    Comment: (NOTE) The Xpert SA Assay (FDA approved for NASAL specimens in  patients 44 years of age and older), is one component of a comprehensive surveillance program. It is not intended to diagnose infection nor to guide or monitor treatment. Performed at [facility], [place] [address] [place], [place] [address]          Radiology Studies: No results found.      Scheduled Meds: . [START ON 10/04/2017] amLODipine  10 mg Oral Daily  . [START ON 10/04/2017] enoxaparin (LOVENOX) injection  40 mg Subcutaneous Q24H  . hydrALAZINE  100 mg Oral TID  . HYDROmorphone      . insulin aspart  0-9 Units Subcutaneous Q4H  . labetalol  400 mg Oral TID  . [START ON 10/04/2017] lisinopril  40 mg Oral Daily  . mouth rinse  15 mL Mouth Rinse BID   Continuous Infusions: . famotidine (PEPCID) IV 20 mg (10/02/17 2125)  . lactated ringers 10 mL/hr at 10/03/17 0905  . piperacillin-tazobactam (ZOSYN)  IV 3.375 g (10/03/17 7564)     LOS: 3 days    Time spent: 25 minutes    Edwin Dada, MD [facility] 10/03/2017, 3:24 PM     Pager [phone] --- please page though AMION:  [email or web address] Password [email or web address] If 7PM-7AM, please contact night-coverage

## 2017-10-03 NOTE — Anesthesia Preprocedure Evaluation (Signed)
Anesthesia Evaluation  Patient identified by MRN, date of birth, ID band Patient awake    Reviewed: Allergy & Precautions, NPO status , Patient's Chart, lab work & pertinent test results, reviewed documented beta blocker date and time   Airway Mallampati: II  TM Distance: >3 FB Neck ROM: Full    Dental no notable dental hx. (+) Teeth Intact, Dental Advisory Given   Pulmonary neg pulmonary ROS,    Pulmonary exam normal breath sounds clear to auscultation       Cardiovascular Exercise Tolerance: Good hypertension, Pt. on home beta blockers and Pt. on medications + Peripheral Vascular Disease  Normal cardiovascular exam Rhythm:Regular Rate:Normal     Neuro/Psych negative neurological ROS  negative psych ROS   GI/Hepatic GERD  Medicated,  Endo/Other  diabetes, Well Controlled, Type 1  Renal/GU Renal InsufficiencyRenal disease     Musculoskeletal   Abdominal   Peds  Hematology  (+) anemia ,   Anesthesia Other Findings   Reproductive/Obstetrics                             Lab Results  Component Value Date   CREATININE 1.40 (H) 10/03/2017   BUN 13 10/03/2017   NA 137 10/03/2017   K 3.5 10/03/2017   CL 102 10/03/2017   CO2 26 10/03/2017    Lab Results  Component Value Date   WBC 14.9 (H) 10/03/2017   HGB 9.0 (L) 10/03/2017   HCT 29.0 (L) 10/03/2017   MCV 94.5 10/03/2017   PLT 262 10/03/2017    Anesthesia Physical Anesthesia Plan  ASA: III  Anesthesia Plan: General   Post-op Pain Management:    Induction: Intravenous  PONV Risk Score and Plan: 2 and Treatment may vary due to age or medical condition, Ondansetron and Dexamethasone  Airway Management Planned: Oral ETT  Additional Equipment:   Intra-op Plan:   Post-operative Plan: Extubation in OR  Informed Consent: I have reviewed the patients History and Physical, chart, labs and discussed the procedure including the  risks, benefits and alternatives for the proposed anesthesia with the patient or authorized representative who has indicated his/her understanding and acceptance.   Dental advisory given  Plan Discussed with: CRNA and Anesthesiologist  Anesthesia Plan Comments:         Anesthesia Quick Evaluation

## 2017-10-03 NOTE — Progress Notes (Signed)
Pharmacy Antibiotic Note  Jerry Weiss is a 62 y.o. male admitted on 09/30/2017 with intra-abdominal infection.  Pharmacy consulted for Zosyn dosing.  He has tolerated this without noted complications.  Today he went for laparoscopic cholecystectomy with findings as indicated.  Plan is to continue IV Zosyn for now.  His WBC is trending down but still mildly elevated at 14.9K.  His creatinine has bumped slightly to 1.4 with an estimated clearance ~ 36m/min.  This change does not warrant any antibiotic adjustments at this time.   Plan: Continue Zosyn 3.375G IV q8h to be infused over 4 hours Trend WBC, temp, renal function  F/U length of therapy and de-escalation plans.  Height: 5' 7"  (170.2 cm) Weight: 176 lb 5.9 oz (80 kg) IBW/kg (Calculated) : 66.1  Temp (24hrs), Avg:98.3 F (36.8 C), Min:98 F (36.7 C), Max:98.8 F (37.1 C)  Recent Labs  Lab 09/30/17 1715 09/30/17 1949 10/01/17 0535 10/02/17 0744 10/03/17 0538  WBC  --  19.2* 21.7* 17.1* 14.9*  CREATININE 1.24  --  1.05 1.42* 1.40*    Estimated Creatinine Clearance: 55.5 mL/min (A) (by C-G formula based on SCr of 1.4 mg/dL (H)).    Allergies  Allergen Reactions  . Ciprofloxacin Other (See Comments)    Unknown  . Codeine Other (See Comments)    Hallucinations   . Shellfish-Derived Products Swelling    "Eyes swell shut"     JTomasita Morrow7/22/2019 3:04 PM

## 2017-10-03 NOTE — Anesthesia Procedure Notes (Signed)
Procedure Name: Intubation Date/Time: 10/03/2017 10:25 AM Performed by: Izora Gala, CRNA Pre-anesthesia Checklist: Patient identified, Emergency Drugs available, Suction available and Patient being monitored Patient Re-evaluated:Patient Re-evaluated prior to induction Oxygen Delivery Method: Circle system utilized Preoxygenation: Pre-oxygenation with 100% oxygen Induction Type: IV induction Ventilation: Mask ventilation without difficulty Laryngoscope Size: Miller and 3 Grade View: Grade II Tube type: Oral Tube size: 7.0 mm Number of attempts: 1 Airway Equipment and Method: Stylet Placement Confirmation: ETT inserted through vocal cords under direct vision,  positive ETCO2 and breath sounds checked- equal and bilateral Secured at: 22 cm

## 2017-10-03 NOTE — Anesthesia Postprocedure Evaluation (Signed)
Anesthesia Post Note  Patient: Jerry Weiss  Procedure(s) Performed: LAPAROSCOPIC CHOLECYSTECTOMY (N/A Abdomen)     Patient location during evaluation: PACU Anesthesia Type: General Level of consciousness: awake and alert Pain management: pain level controlled Vital Signs Assessment: post-procedure vital signs reviewed and stable Respiratory status: spontaneous breathing, nonlabored ventilation, respiratory function stable and patient connected to nasal cannula oxygen Cardiovascular status: blood pressure returned to baseline and stable Postop Assessment: no apparent nausea or vomiting Anesthetic complications: no    Last Vitals:  Vitals:   10/03/17 1407 10/03/17 1408  BP: 133/63   Pulse: 69 71  Resp: 14 16  Temp:    SpO2: 95% 95%    Last Pain:  Vitals:   10/03/17 1330  TempSrc:   PainSc: [name]

## 2017-10-03 NOTE — Transfer of Care (Signed)
Immediate Anesthesia Transfer of Care Note  Patient: Jerry Weiss  Procedure(s) Performed: LAPAROSCOPIC CHOLECYSTECTOMY (N/A Abdomen)  Patient Location: PACU  Anesthesia Type:General  Level of Consciousness: awake, sedated and patient cooperative  Airway & Oxygen Therapy: Patient Spontanous Breathing and Patient connected to nasal cannula oxygen  Post-op Assessment: Report given to RN, Post -op Vital signs reviewed and stable and Patient moving all extremities  Post vital signs: Reviewed and stable  Last Vitals:  Vitals Value Taken Time  BP 131/63 10/03/2017 12:35 PM  Temp    Pulse 74 10/03/2017 12:40 PM  Resp 14 10/03/2017 12:40 PM  SpO2 93 % 10/03/2017 12:40 PM  Vitals shown include unvalidated device data.  Last Pain:  Vitals:   10/03/17 0800  TempSrc:   PainSc: 2          Complications: No apparent anesthesia complications

## 2017-10-04 ENCOUNTER — Encounter (HOSPITAL_COMMUNITY): Payer: Self-pay | Admitting: General Surgery

## 2017-10-04 LAB — GLUCOSE, CAPILLARY
GLUCOSE-CAPILLARY: 90 mg/dL (ref 70–99)
Glucose-Capillary: 136 mg/dL — ABNORMAL HIGH (ref 70–99)
Glucose-Capillary: 158 mg/dL — ABNORMAL HIGH (ref 70–99)
Glucose-Capillary: 177 mg/dL — ABNORMAL HIGH (ref 70–99)
Glucose-Capillary: 198 mg/dL — ABNORMAL HIGH (ref 70–99)

## 2017-10-04 LAB — COMPREHENSIVE METABOLIC PANEL
ALT: 33 U/L (ref 0–44)
ANION GAP: 8 (ref 5–15)
AST: 44 U/L — AB (ref 15–41)
Albumin: 2.2 g/dL — ABNORMAL LOW (ref 3.5–5.0)
Alkaline Phosphatase: 193 U/L — ABNORMAL HIGH (ref 38–126)
BUN: 14 mg/dL (ref 8–23)
CO2: 26 mmol/L (ref 22–32)
Calcium: 8.7 mg/dL — ABNORMAL LOW (ref 8.9–10.3)
Chloride: 104 mmol/L (ref 98–111)
Creatinine, Ser: 1.3 mg/dL — ABNORMAL HIGH (ref 0.61–1.24)
GFR, EST NON AFRICAN AMERICAN: 57 mL/min — AB (ref >60)
Glucose, Bld: 85 mg/dL (ref 70–99)
POTASSIUM: 3.4 mmol/L — AB (ref 3.5–5.1)
Sodium: 138 mmol/L (ref 135–145)
TOTAL PROTEIN: 5.9 g/dL — AB (ref 6.5–8.1)
Total Bilirubin: 1.1 mg/dL (ref 0.3–1.2)

## 2017-10-04 LAB — CBC
HCT: 27.8 % — ABNORMAL LOW (ref 39.0–52.0)
Hemoglobin: 8.9 g/dL — ABNORMAL LOW (ref 13.0–17.0)
MCH: 29.8 pg (ref 26.0–34.0)
MCHC: 32 g/dL (ref 30.0–36.0)
MCV: 93 fL (ref 78.0–100.0)
Platelets: 275 10*3/uL (ref 150–400)
RBC: 2.99 MIL/uL — ABNORMAL LOW (ref 4.22–5.81)
RDW: 14.4 % (ref 11.5–15.5)
WBC: 17.4 10*3/uL — AB (ref 4.0–10.5)

## 2017-10-04 MED ORDER — INSULIN ASPART 100 UNIT/ML ~~LOC~~ SOLN
0.0000 [IU] | Freq: Three times a day (TID) | SUBCUTANEOUS | Status: DC
  Administered 2017-10-05: 2 [IU] via SUBCUTANEOUS
  Administered 2017-10-06 – 2017-10-11 (×4): 1 [IU] via SUBCUTANEOUS
  Administered 2017-10-12: 2 [IU] via SUBCUTANEOUS
  Administered 2017-10-13: 1 [IU] via SUBCUTANEOUS

## 2017-10-04 MED ORDER — POTASSIUM CHLORIDE CRYS ER 20 MEQ PO TBCR
40.0000 meq | EXTENDED_RELEASE_TABLET | Freq: Once | ORAL | Status: AC
  Administered 2017-10-04: 40 meq via ORAL
  Filled 2017-10-04: qty 2

## 2017-10-04 NOTE — Discharge Instructions (Signed)
[facility] ______[facility] LAPAROSCOPIC SURGERY: POST OP INSTRUCTIONS Always review your discharge instruction sheet given to you by the facility where your surgery was performed. IF YOU HAVE DISABILITY OR FAMILY LEAVE FORMS, YOU MUST BRING THEM TO THE OFFICE FOR PROCESSING.   DO NOT GIVE THEM TO YOUR DOCTOR.  1. A prescription for pain medication may be given to you upon discharge.  Take your pain medication as prescribed, if needed.  If narcotic pain medicine is not needed, then you may take acetaminophen (Tylenol) or ibuprofen (Advil) as needed. 2. Take your usually prescribed medications unless otherwise directed. 3. If you need a refill on your pain medication, please contact your pharmacy.  They will contact our office to request authorization. Prescriptions will not be filled after 5pm or on week-ends. 4. You should follow a light diet the first few days after arrival home, such as soup and crackers, etc.  Be sure to include lots of fluids daily. 5. Most patients will experience some swelling and bruising in the area of the incisions.  Ice packs will help.  Swelling and bruising can take several days to resolve.  6. It is common to experience some constipation if taking pain medication after surgery.  Increasing fluid intake and taking a stool softener (such as Colace) will usually help or prevent this problem from occurring.  A mild laxative (Milk of Magnesia or Miralax) should be taken according to package instructions if there are no bowel movements after 48 hours. 7. Unless discharge instructions indicate otherwise, you may remove your bandages 24-48 hours after surgery, and you may shower at that time.  You may have steri-strips (small skin tapes) in place directly over the incision.  These strips should be left on the skin for 7-10 days.  If your surgeon used skin glue on the incision, you may shower in 24 hours.  The glue will flake off over the next 2-3 weeks.  Any sutures or  staples will be removed at the office during your follow-up visit. 8. ACTIVITIES:  You may resume regular (light) daily activities beginning the next day--such as daily self-care, walking, climbing stairs--gradually increasing activities as tolerated.  You may have sexual intercourse when it is comfortable.  Refrain from any heavy lifting or straining until approved by your doctor. a. You may drive when you are no longer taking prescription pain medication, you can comfortably wear a seatbelt, and you can safely maneuver your car and apply brakes. b. RETURN TO WORK:  __________________________________________________________ 9. You should see your doctor in the office for a follow-up appointment approximately 2-3 weeks after your surgery.  Make sure that you call for this appointment within a day or two after you arrive home to insure a convenient appointment time. 10. OTHER INSTRUCTIONS: __________________________________________________________________________________________________________________________ __________________________________________________________________________________________________________________________ WHEN TO CALL YOUR DOCTOR: 1. Fever over 101.0 2. Inability to urinate 3. Continued bleeding from incision. 4. Increased pain, redness, or drainage from the incision. 5. Increasing abdominal pain  The clinic staff is available to answer your questions during regular business hours.  Please dont hesitate to call and ask to speak to one of the nurses for clinical concerns.  If you have a medical emergency, go to the nearest emergency room or call 911.  A surgeon from [facility] is always on call at the hospital. [address], [place], [place], [place]  [address] ? [address], [place], [place]   [address] [phone] ? [phone] ? FAX [phone] Web site:  [email or web address]   Surgical [facility] Call if there is any change in  the color of the JP drainage at home. Surgical drains are used to remove extra fluid that normally builds up in a surgical wound after surgery. A surgical drain helps to heal a surgical wound. Different kinds of surgical drains include:  Active drains. These drains use suction to pull drainage away from the surgical wound. Drainage flows through a tube to a container outside of the body. It is important to keep the bulb or the drainage container flat (compressed) at all times, except while you empty it. Flattening the bulb or container creates suction. The two most common types of active drains are bulb drains and Hemovac drains.  Passive drains. These drains allow fluid to drain naturally, by gravity. Drainage flows through a tube to a bandage (dressing) or a container outside of the body. Passive drains do not need to be emptied. The most common type of passive drain is the Penrose drain.  A drain is placed during surgery. Immediately after surgery, drainage is usually bright red and a little thicker than water. The drainage may gradually turn yellow or pink and become thinner. It is likely that your health care provider will remove the drain when the drainage stops or when the amount decreases to 1-2 Tbsp (15-30 mL) during a 24-hour period. How to care for your surgical drain  Keep the skin around the drain dry and covered with a dressing at all times.  Check your drain area every day for signs of infection. Check for: ? More redness, swelling, or pain. ? Pus or a bad smell. ? Cloudy drainage. Follow instructions from your health care provider about how to take care of your drain and how to change your dressing. Change your dressing at least one time every day. Change it more often if needed to keep the dressing dry. Make sure you: 1. Gather your supplies, including: ? Tape. ? Germ-free cleaning solution (sterile saline). ? Split gauze drain sponge: 4 x 4 inches (10 x 10 cm). ? Gauze square:  4 x 4 inches (10 x 10 cm). 2. Wash your hands with soap and water before you change your dressing. If soap and water are not available, use hand sanitizer. 3. Remove the old dressing. Avoid using scissors to do that. 4. Use sterile saline to clean your skin around the drain. 5. Place the tube through the slit in a drain sponge. Place the drain sponge so that it covers your wound. 6. Place the gauze square or another drain sponge on top of the drain sponge that is on the wound. Make sure the tube is between those layers. 7. Tape the dressing to your skin. 8. If you have an active bulb or Hemovac drain, tape the drainage tube to your skin 1-2 inches (2.5-5 cm) below the place where the tube enters your body. Taping keeps the tube from pulling on any stitches (sutures) that you have. 9. Wash your hands with soap and water. 10. Write down the color of your drainage and how often you change your dressing.  How to empty your active bulb or Hemovac drain 1. Make sure that you have a measuring cup that you can empty your drainage into. 2. Wash your hands with soap and water. If soap and water are not available, use hand sanitizer. 3. Gently move your fingers down the tube while squeezing very lightly. This is called stripping the tube. This clears any drainage,  clots, or tissue from the tube. ? Do not pull on the tube. ? You may need to strip the tube several times every day to keep the tube clear. 4. Open the bulb cap or the drain plug. Do not touch the inside of the cap or the bottom of the plug. 5. Empty all of the drainage into the measuring cup. 6. Compress the bulb or the container and replace the cap or the plug. To compress the bulb or the container, squeeze it firmly in the middle while you close the cap or plug the container. 7. Write down the amount of drainage that you have in each 24-hour period. If you have less than 2 Tbsp (30 mL) of drainage during 24 hours, contact your health care  provider. 8. Flush the drainage down the toilet. 9. Wash your hands with soap and water. Contact a health care provider if:  You have more redness, swelling, or pain around your drain area.  The amount of drainage that you have is increasing instead of decreasing.  You have pus or a bad smell coming from your drain area.  You have a fever.  You have drainage that is cloudy.  There is a sudden stop or a sudden decrease in the amount of drainage that you have.  Your tube falls out.  Your active draindoes not stay compressedafter you empty it. This information is not intended to replace advice given to you by your health care provider. Make sure you discuss any questions you have with your health care provider. Document Released: 02/27/2000 Document Revised: 08/07/2015 Document Reviewed: 09/18/2014 Elsevier Interactive Patient Education  2018 [employer].

## 2017-10-04 NOTE — Progress Notes (Signed)
PROGRESS NOTE    Jerry Weiss  FWY:[id number] DOB: 11-16-1955 DOA: 09/30/2017 PCP: [name], MD      Brief Narrative:  Jerry Weiss is a 62 y.o. 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  Found also to have new onset DM and incidental RIGHT renal mass at that time.  Dissection treated medically w/ hydral, labetalol, lisionopril, amlodipine has been stable, followed by Dr. Donzetta Matters here. Underwent robotic partial Right nephrectomy on 07/28/17.   Then earlier this month, admitted with painless jaundice, found to have biliary obstruction, liver masses -- ERCP done, stent placed, symptoms resolved. US guided liver biopsy on 7/11. Patient was home for about 5 days since this episode until developed back and abdominal pain day of admission with sever vomiting.  CT showed known liver masses and peripancreatic adenopathy.  US showed signs of cholecystitis.      Assessment & Plan:  Acute cholecystitis  Post-op from laparoscopic cholecystectomy 7/22. -Advance diet per Gen Surg -Continue oxycodone for pain -Consult general surgery, appreciate cares   Biliary obstruction s/p stent    CBD stent placed earlier this month, stent has now migrated and is causing cystic duct occlusion; GI doubt stent occlusion/stenosis.  -Consult Gastroenterology, appreciate cares  Liver mass, presumed metastases Status-post biopsy by IR on 7/11.  This biopsy is indeterminate, although suspicious for malignancy, it has been sent to [place] for review, results expected by the end of the week.  Specimen does not appear in Epic, unclear why.  Spoke with Dr. Lyndon Code directly.   Hypertension  BP stable on home regimen. -Continue amlodipine, lisinopril, hydralazine, labetalol  -PRN labetalol for worse  hypertension   -Trend Cr post-op  Type II DM  A1c was 6.8% in June, glucoses excellent. -Hold Actos, Januvia -Continue SSI  Mild AKI Cr baseline 1.0.  Bumped to 1.4 at admission, slightly down to 1.3 today.   -Push fluids   Anemia Normocytic.  Hgb stable post op.  Transaminitis Stable, from liver mets.  Hypokalemia Mild -Supplement K      DVT prophylaxis: Lovenox Code Status: FULL Family Communication: None present MDM and disposition Plan: The below labs and imaging were reviewed and summarized above.  Pathology discussed by phone with Dr. Lyndon Code.  Case discussed with Dr. Watt Climes.  The above medication managemnet.   He was admitted with cholecystitis, s/p gallbladder removal yesterday.  PT eval today, likkely home with [facility] or outpatient PT tomorrow or Thursday.    Consultants:   Gen Surg  GI  Procedures:   None  Antimicrobials:   Zosyn 7/19 >> 7/23   Subjective: Feeling well.  Poor sleep overnight, pain controlled with Oxy.  Tolerating diet so far.  Passed flatus.  Abd pain better.  No fever, confusion.  No cough, dysuria.        Objective: Vitals:   10/03/17 1408 10/03/17 1447 10/03/17 2100 10/04/17 0451  BP:  (!) 146/69 127/68 (!) 150/71  Pulse: 71 71 76 62  Resp: 16 14 18 18   Temp:  98.2 F (36.8 C) 98.4 F (36.9 C) 98.2 F (36.8 C)  TempSrc:  Oral  Oral  SpO2: 95% 92% 98% 92%  Weight:      Height:        Intake/Output Summary (Last 24 hours) at 10/04/2017 1001 Last data filed at 10/04/2017  0750 Gross per 24 hour  Intake 2175.2 ml  Output 1660 ml  Net 515.2 ml   Filed Weights   10/01/17 0018 10/02/17 0537 10/03/17 0436  Weight: 76.8 kg (169 lb 5 oz) 79.6 kg (175 lb 7.8 oz) 80 kg (176 lb 5.9 oz)    Examination: General appearance: Thin male, lying in bed, interactive, NAD. HEENT: Scleral icterus, conjunctive and lids and lashes normal.  No nasal discharge, or epistaxis. OP dry, no oral lesions, normal hearing. Skin: Warm and dry,  mild jaundice, no suspicious rashes. Cardiac: RRR, no murmurs, no LE edema. Respiratory: Normal respiratory rate and rhythm.  No rales or wheezees Abdomen: JP drain with scant serosanguinous fluid.  Abdomen with tenderness in RUQ, mild, no guarding, no rigidity or rebound.    MSK: No deformities or effusions. Neuro: Awake and alert.  EOMI, cranial nerves normal, speech fluent, moves all extremities equally. Psych: Sensorium intact and responding to questions, attention normal, judgment appears normal.    Data Reviewed: I have personally reviewed following labs and imaging studies:  CBC: Recent Labs  Lab 09/30/17 1949 10/01/17 0535 10/02/17 0744 10/03/17 0538 10/04/17 0645  WBC 19.2* 21.7* 17.1* 14.9* 17.4*  NEUTROABS 16.6* 18.7*  --   --   --   HGB 11.3* 10.2* 8.8* 9.0* 8.9*  HCT 35.7* 32.8* 28.0* 29.0* 27.8*  MCV 94.2 95.1 94.6 94.5 93.0  PLT 313 297 240 262 630   Basic Metabolic Panel: Recent Labs  Lab 09/30/17 1715 10/01/17 0535 10/02/17 0744 10/03/17 0538 10/04/17 0645  NA 140 137 137 137 138  K 4.3 3.4* 3.4* 3.5 3.4*  CL 105 104 104 102 104  CO2 24 24 24 26 26   GLUCOSE 132* 127* 97 102* 85  BUN 12 11 15 13 14   CREATININE 1.24 1.05 1.42* 1.40* 1.30*  CALCIUM 9.5 8.6* 8.0* 8.3* 8.7*   GFR: Estimated Creatinine Clearance: 59.8 mL/min (A) (by C-G formula based on SCr of 1.3 mg/dL (H)). Liver Function Tests: Recent Labs  Lab 09/30/17 1715 10/01/17 0535 10/02/17 0744 10/04/17 0645  AST 37 28 23 44*  ALT 51* 39 28 33  ALKPHOS 335* 265* 191* 193*  BILITOT 1.8* 1.8* 1.4* 1.1  PROT 7.0 6.0* 5.3* 5.9*  ALBUMIN 3.4* 2.8* 2.3* 2.2*   Recent Labs  Lab 09/30/17 1715 10/01/17 0535  LIPASE 21 20   No results for input(s): AMMONIA in the last 168 hours. Coagulation Profile: No results for input(s): INR, PROTIME in the last 168 hours. Cardiac Enzymes: No results for input(s): CKTOTAL, CKMB, CKMBINDEX, TROPONINI in the last 168 hours. BNP (last 3 results) No  results for input(s): PROBNP in the last 8760 hours. HbA1C: No results for input(s): HGBA1C in the last 72 hours. CBG: Recent Labs  Lab 10/03/17 1643 10/03/17 2004 10/04/17 0001 10/04/17 0414 10/04/17 0756  GLUCAP 178* 146* 198* 158* 90   Lipid Profile: No results for input(s): CHOL, HDL, LDLCALC, TRIG, CHOLHDL, LDLDIRECT in the last 72 hours. Thyroid Function Tests: No results for input(s): TSH, T4TOTAL, FREET4, T3FREE, THYROIDAB in the last 72 hours. Anemia Panel: No results for input(s): VITAMINB12, FOLATE, FERRITIN, TIBC, IRON, RETICCTPCT in the last 72 hours. Urine analysis:    Component Value Date/Time   COLORURINE YELLOW 09/30/2017 2106   APPEARANCEUR CLEAR 09/30/2017 2106   LABSPEC 1.016 09/30/2017 2106   PHURINE 5.0 09/30/2017 2106   GLUCOSEU NEGATIVE 09/30/2017 2106   HGBUR SMALL (A) 09/30/2017 2106   BILIRUBINUR NEGATIVE 09/30/2017 2106   KETONESUR NEGATIVE  09/30/2017 2106   PROTEINUR 30 (A) 09/30/2017 2106   NITRITE NEGATIVE 09/30/2017 2106   LEUKOCYTESUR SMALL (A) 09/30/2017 2106   Sepsis Labs: @LABRCNTIP (procalcitonin:4,lacticacidven:4)  ) Recent Results (from the past 240 hour(s))  Culture, blood (routine x 2)     Status: None (Preliminary result)   Collection Time: 09/30/17 11:45 PM  Result Value Ref Range Status   Specimen Description BLOOD LEFT ARM  Final   Special Requests   Final    BOTTLES DRAWN AEROBIC AND ANAEROBIC Blood Culture adequate volume   Culture   Final    NO GROWTH 2 DAYS Performed at [facility], [place] [address], [place], [place] [address]    Report Status PENDING  Incomplete  Culture, blood (routine x 2)     Status: None (Preliminary result)   Collection Time: 10/01/17 12:00 AM  Result Value Ref Range Status   Specimen Description BLOOD LEFT HAND  Final   Special Requests   Final    BOTTLES DRAWN AEROBIC ONLY Blood Culture adequate volume   Culture   Final    NO GROWTH 2 DAYS Performed at [facility], [place] [address] [place], [place] [address]    Report Status PENDING  Incomplete  Surgical pcr screen     Status: None   Collection Time: 10/02/17  5:04 PM  Result Value Ref Range Status   MRSA, PCR NEGATIVE NEGATIVE Final   Staphylococcus aureus NEGATIVE NEGATIVE Final    Comment: (NOTE) The Xpert SA Assay (FDA approved for NASAL specimens in patients 56 years of age and older), is one component of a comprehensive surveillance program. It is not intended to diagnose infection nor to guide or monitor treatment. Performed at [facility], [place] [address], [place], [place] [address]          Radiology Studies: No results found.      Scheduled Meds: . amLODipine  10 mg Oral Daily  . enoxaparin (LOVENOX) injection  40 mg Subcutaneous Q24H  . hydrALAZINE  100 mg Oral TID  . insulin aspart  0-9 Units Subcutaneous Q4H  . labetalol  400 mg Oral TID  . lisinopril  40 mg Oral Daily  . mouth rinse  15 mL Mouth Rinse BID  . potassium chloride  40 mEq Oral Once   Continuous Infusions: . famotidine (PEPCID) IV Stopped (10/03/17 2356)  . lactated ringers 10 mL/hr at 10/03/17 0905     LOS: 4 days    Time spent: 25 minutes    Edwin Dada, MD [facility] 10/04/2017, 10:01 AM     Pager [phone] --- please page though AMION:  [email or web address] Password [email or web address] If 7PM-7AM, please contact night-coverage

## 2017-10-04 NOTE — Progress Notes (Signed)
1 Day Post-Op    CC: Abdominal pain, back pain and severe vomiting  Subjective: He is hungry and is ordering breakfast.  Pain is moderate.  Drainage from the JP is serous.  Port sites all look good.  Objective: Vital signs in last 24 hours: Temp:  [98 F (36.7 C)-98.4 F (36.9 C)] 98.2 F (36.8 C) (07/23 0451) Pulse Rate:  [62-76] 62 (07/23 0451) Resp:  [12-20] 18 (07/23 0451) BP: (122-150)/(62-75) 150/71 (07/23 0451) SpO2:  [92 %-98 %] 92 % (07/23 0451) Last BM Date: 09/30/17 320 p.o. yesterday 350 p.o. recorded this a.m. 1300 IV 1375 urine 60 cc were drained but he drained a lot around the JP earlier Afebrile vital signs are stable Potassium is 3.4, creatinine is 1.3, LFTs are stable. WBC elevated 17.4, H/H: 8.9/27.8 -stable Intake/Output from previous day: 07/22 0701 - 07/23 0700 In: 1825.2 [P.O.:320; I.V.:1206; IV Piggyback:179.2] Out: 1435 [Urine:1375; Drains:60] Intake/Output this shift: Total I/O In: 350 [P.O.:350] Out: 225 [Urine:225]  General appearance: alert, cooperative and no distress Resp: clear to auscultation bilaterally GI: Soft, sore, port sites look fine, drain is serous.  Lab Results:  Recent Labs    10/03/17 0538 10/04/17 0645  WBC 14.9* 17.4*  HGB 9.0* 8.9*  HCT 29.0* 27.8*  PLT 262 275    BMET Recent Labs    10/03/17 0538 10/04/17 0645  NA 137 138  K 3.5 3.4*  CL 102 104  CO2 26 26  GLUCOSE 102* 85  BUN 13 14  CREATININE 1.40* 1.30*  CALCIUM 8.3* 8.7*   PT/INR No results for input(s): LABPROT, INR in the last 72 hours.  Recent Labs  Lab 09/30/17 1715 10/01/17 0535 10/02/17 0744 10/04/17 0645  AST 37 28 23 44*  ALT 51* 39 28 33  ALKPHOS 335* 265* 191* 193*  BILITOT 1.8* 1.8* 1.4* 1.1  PROT 7.0 6.0* 5.3* 5.9*  ALBUMIN 3.4* 2.8* 2.3* 2.2*     Lipase     Component Value Date/Time   LIPASE 20 10/01/2017 0535     Medications: . amLODipine  10 mg Oral Daily  . enoxaparin (LOVENOX) injection  40 mg Subcutaneous  Q24H  . hydrALAZINE  100 mg Oral TID  . insulin aspart  0-9 Units Subcutaneous Q4H  . labetalol  400 mg Oral TID  . lisinopril  40 mg Oral Daily  . mouth rinse  15 mL Mouth Rinse BID  . potassium chloride  40 mEq Oral Once   Anti-infectives (From admission, onward)   Start     Dose/Rate Route Frequency Ordered Stop   10/03/17 1600  piperacillin-tazobactam (ZOSYN) IVPB 3.375 g     3.375 g 12.5 mL/hr over 240 Minutes Intravenous Every 8 hours 10/03/17 1535 10/04/17 0932   10/01/17 0600  piperacillin-tazobactam (ZOSYN) IVPB 3.375 g  Status:  Discontinued     3.375 g 12.5 mL/hr over 240 Minutes Intravenous Every 8 hours 09/30/17 2332 10/03/17 1525   09/30/17 2200  piperacillin-tazobactam (ZOSYN) IVPB 3.375 g     3.375 g 100 mL/hr over 30 Minutes Intravenous  Once 09/30/17 2152 09/30/17 2240      Assessment/Plan Type B aortic dissection Right renal mass/laparoscopic assisted right partial nephrectomy 07/28/2017, Dr. Alinda Weiss Multiple Liver metastasis with biliary obstruction -S/P ERCP x 2 -suspected pancreatic cancer -pathology pending liver biopsy 09/22/2017 Type 2 diabetes Hypertension Struct of sleep apnea-CPAP at night Hx of umbilical and ventral hernias  S/P TURP 09/08/2017 GERD  Cholecystitis/cholelithiasis  Laparoscopic cholecystectomy, suture repair of epigastric hernia, 10/03/2017  Dr. Marland Kitchen Weiss POD #1   FEN:IV fluids/carb mod ID: Zosyn 7/19 =>> day 5 - discontinue 24 hours post op DVT:  SCD/Lovenox Follow up:  TBD  Plan: We will continue the drain for now I will arrange to have pulled next week in the office.  He can discontinue the antibiotic 24 hours after surgery or today if he goes home this afternoon.. Advance his diet as tolerated to carb modified.  I have follow up for his drain removal and post op follow up/instructions in AVS.      LOS: 4 days    Jerry Weiss 10/04/2017 [phone]

## 2017-10-04 NOTE — Evaluation (Signed)
Physical Therapy Evaluation Patient Details Name: Katherine Syme MRN: [id number] DOB: November 19, 1955 Today's Date: 10/04/2017   History of Present Illness  62 y.o. male admitted on 09/30/17 for acute cholecystitis s/p lap chole 10/03/17.  Pt with significant PMH of diastolic CHF, type B asortic dissection leading to L kidney infarction, DM, R renal mass s/p R partial nephrectomy, biliary obstruction s/p stent and liver masses (s/p ERCP) and US guided liver bx on 09/22/17, HTN, and anemia.    Clinical Impression  Pt needed some encouragement to work with PT stating that he didn't think he could walk today.  After gentle encouragement, he did walk a good distance down the hallway refusing to use RW with moderately abnormal gait pattern.  He tolerated it well and was glad he did it after it was done.  I informed him that the mobility tech and I would have him walking frequently.   PT to follow acutely for deficits listed below.       Follow Up Recommendations No PT follow up    Equipment Recommendations  None recommended by PT    Recommendations for Other Services   NA    Precautions / Restrictions Precautions Precautions: None      Mobility  Bed Mobility               General bed mobility comments: Pt was OOB in the chair upon arrival  Transfers Overall transfer level: Needs assistance Equipment used: None Transfers: Sit to/from Stand Sit to Stand: Supervision         General transfer comment: close supervision during transitions as pt relies heavily on hands for support and reports most pain with transitional movements.  Ambulation/Gait Ambulation/Gait assistance: Supervision Gait Distance (Feet): 85 Feet Assistive device: None(pt refused RW when offered) Gait Pattern/deviations: Step-through pattern;Shuffle;Wide base of support     General Gait Details: Pt with wide BOS, seemingly vaulting vs steppage pattern on the left, both legs are externally rotated L>R and pt with  wide base likely due to body habitus. He is mildly unsteady and we stayed close to the wall in case he needed to reach out to the rail.           Balance Overall balance assessment: Needs assistance Sitting-balance support: Feet supported;No upper extremity supported Sitting balance-Leahy Scale: Good     Standing balance support: No upper extremity supported;Single extremity supported Standing balance-Leahy Scale: Fair                               Pertinent Vitals/Pain Pain Assessment: Faces Faces Pain Scale: Hurts even more Pain Location: abdomen Pain Descriptors / Indicators: Grimacing;Guarding Pain Intervention(s): Limited activity within patient's tolerance;Monitored during session;Repositioned    Home Living Family/patient expects to be discharged to:: Private residence Living Arrangements: Alone   Type of Home: House       Home Layout: Two level Home Equipment: None      Prior Function Level of Independence: Independent         Comments: Pt adamate about not using a RW         Extremity/Trunk Assessment   Upper Extremity Assessment Upper Extremity Assessment: Generalized weakness    Lower Extremity Assessment Lower Extremity Assessment: Generalized weakness    Cervical / Trunk Assessment Cervical / Trunk Assessment: Normal  Communication   Communication: No difficulties  Cognition Arousal/Alertness: Awake/alert Behavior During Therapy: WFL for tasks assessed/performed Overall Cognitive Status: Within Functional  Limits for tasks assessed                                               Assessment/Plan    PT Assessment Patient needs continued PT services  PT Problem List Decreased strength;Decreased activity tolerance;Decreased balance;Decreased mobility;Decreased knowledge of use of DME;Obesity;Pain       PT Treatment Interventions DME instruction;Gait training;Stair training;Functional mobility  training;Therapeutic activities;Therapeutic exercise;Balance training;Patient/family education    PT Goals (Current goals can be found in the Care Plan section)  Acute Rehab PT Goals Patient Stated Goal: to stop being admitted to the hospital PT Goal Formulation: With patient Time For Goal Achievement: 10/18/17 Potential to Achieve Goals: Good    Frequency Min 3X/week           AM-PAC PT "6 Clicks" Daily Activity  Outcome Measure Difficulty turning over in bed (including adjusting bedclothes, sheets and blankets)?: A Little Difficulty moving from lying on back to sitting on the side of the bed? : A Little Difficulty sitting down on and standing up from a chair with arms (e.g., wheelchair, bedside commode, etc,.)?: None Help needed moving to and from a bed to chair (including a wheelchair)?: None Help needed walking in hospital room?: None Help needed climbing 3-5 steps with a railing? : A Little 6 Click Score: 21    End of Session   Activity Tolerance: Patient limited by pain;Patient limited by fatigue Patient left: in chair;with call bell/phone within reach;with family/visitor present   PT Visit Diagnosis: Muscle weakness (generalized) (M62.81);Difficulty in walking, not elsewhere classified (R26.2);Pain Pain - Right/Left: (abdominal) Pain - part of body: (abdominal)    Time: 4753-3917 PT Time Calculation (min) (ACUTE ONLY): 20 min   Charges:         Wells Guiles B. Dixie Jafri, PT, DPT [phone]   PT Evaluation $PT Eval Moderate Complexity: 1 Mod     10/04/2017, 12:50 PM

## 2017-10-05 ENCOUNTER — Encounter (HOSPITAL_COMMUNITY): Payer: Self-pay

## 2017-10-05 DIAGNOSIS — K8309 Other cholangitis: Secondary | ICD-10-CM

## 2017-10-05 LAB — COMPREHENSIVE METABOLIC PANEL
ALT: 31 U/L (ref 0–44)
ANION GAP: 8 (ref 5–15)
AST: 32 U/L (ref 15–41)
Albumin: 2.3 g/dL — ABNORMAL LOW (ref 3.5–5.0)
Alkaline Phosphatase: 184 U/L — ABNORMAL HIGH (ref 38–126)
BUN: 16 mg/dL (ref 8–23)
CHLORIDE: 105 mmol/L (ref 98–111)
CO2: 26 mmol/L (ref 22–32)
Calcium: 8.8 mg/dL — ABNORMAL LOW (ref 8.9–10.3)
Creatinine, Ser: 1.36 mg/dL — ABNORMAL HIGH (ref 0.61–1.24)
GFR calc non Af Amer: 54 mL/min — ABNORMAL LOW (ref >60)
Glucose, Bld: 124 mg/dL — ABNORMAL HIGH (ref 70–99)
POTASSIUM: 3.8 mmol/L (ref 3.5–5.1)
Sodium: 139 mmol/L (ref 135–145)
Total Bilirubin: 1.1 mg/dL (ref 0.3–1.2)
Total Protein: 5.8 g/dL — ABNORMAL LOW (ref 6.5–8.1)

## 2017-10-05 LAB — GLUCOSE, CAPILLARY
GLUCOSE-CAPILLARY: 104 mg/dL — AB (ref 70–99)
GLUCOSE-CAPILLARY: 127 mg/dL — AB (ref 70–99)
GLUCOSE-CAPILLARY: 152 mg/dL — AB (ref 70–99)
GLUCOSE-CAPILLARY: 165 mg/dL — AB (ref 70–99)

## 2017-10-05 LAB — CBC
HCT: 28.3 % — ABNORMAL LOW (ref 39.0–52.0)
Hemoglobin: 8.9 g/dL — ABNORMAL LOW (ref 13.0–17.0)
MCH: 29.7 pg (ref 26.0–34.0)
MCHC: 31.4 g/dL (ref 30.0–36.0)
MCV: 94.3 fL (ref 78.0–100.0)
PLATELETS: 310 10*3/uL (ref 150–400)
RBC: 3 MIL/uL — AB (ref 4.22–5.81)
RDW: 14.6 % (ref 11.5–15.5)
WBC: 19.6 10*3/uL — ABNORMAL HIGH (ref 4.0–10.5)

## 2017-10-05 MED ORDER — FAMOTIDINE 20 MG PO TABS
20.0000 mg | ORAL_TABLET | Freq: Every day | ORAL | Status: DC
  Administered 2017-10-05 – 2017-10-14 (×9): 20 mg via ORAL
  Filled 2017-10-05 (×10): qty 1

## 2017-10-05 MED ORDER — METHOCARBAMOL 500 MG PO TABS
500.0000 mg | ORAL_TABLET | Freq: Three times a day (TID) | ORAL | Status: DC | PRN
  Administered 2017-10-05 – 2017-10-07 (×5): 500 mg via ORAL
  Filled 2017-10-05 (×5): qty 1

## 2017-10-05 MED ORDER — FAMOTIDINE IN NACL 20-0.9 MG/50ML-% IV SOLN: 20.0000 mg | INTRAVENOUS | Status: DC

## 2017-10-05 MED ORDER — PIPERACILLIN-TAZOBACTAM 3.375 G IVPB
3.3750 g | Freq: Three times a day (TID) | INTRAVENOUS | Status: DC
  Administered 2017-10-05 – 2017-10-12 (×23): 3.375 g via INTRAVENOUS
  Filled 2017-10-05 (×22): qty 50

## 2017-10-05 MED ORDER — SODIUM CHLORIDE 0.9 % IV SOLN
2.0000 g | INTRAVENOUS | Status: DC
  Filled 2017-10-05: qty 20

## 2017-10-05 NOTE — Progress Notes (Signed)
Patient ID: Jerry Weiss, male   DOB: 07/23/1955, 62 y.o.   MRN: [id number]    2 Days Post-Op  Subjective: Pt doesn't feel as well today, but mostly secondary to MSK pain.  He "tweaked" his abdominal muscles reaching for something yesterday.  He is eating well and passing flatus.    Objective: Vital signs in last 24 hours: Temp:  [98.2 F (36.8 C)-98.8 F (37.1 C)] 98.5 F (36.9 C) (07/24 0543) Pulse Rate:  [66-74] 74 (07/24 0543) Resp:  [16-17] 16 (07/24 0543) BP: (125-145)/(71-75) 145/75 (07/24 0543) SpO2:  [94 %-96 %] 94 % (07/24 0543) Last BM Date: 09/30/17  Intake/Output from previous day: 07/23 0701 - 07/24 0700 In: 1450 [P.O.:1450] Out: 525 [Urine:425; Drains:100] Intake/Output this shift: No intake/output data recorded.  PE: Heart: regular Lungs: CTAB Abd: soft, quite tender in RUQ (he states it is a MSK pain, not inside his abdomen), +BS, JP drain with serosang output, 40cc yesterday  Lab Results:  Recent Labs    10/04/17 0645 10/05/17 0611  WBC 17.4* 19.6*  HGB 8.9* 8.9*  HCT 27.8* 28.3*  PLT 275 310   BMET Recent Labs    10/04/17 0645 10/05/17 0611  NA 138 139  K 3.4* 3.8  CL 104 105  CO2 26 26  GLUCOSE 85 124*  BUN 14 16  CREATININE 1.30* 1.36*  CALCIUM 8.7* 8.8*   PT/INR No results for input(s): LABPROT, INR in the last 72 hours. CMP     Component Value Date/Time   NA 139 10/05/2017 0611   K 3.8 10/05/2017 0611   CL 105 10/05/2017 0611   CO2 26 10/05/2017 0611   GLUCOSE 124 (H) 10/05/2017 0611   BUN 16 10/05/2017 0611   CREATININE 1.36 (H) 10/05/2017 0611   CALCIUM 8.8 (L) 10/05/2017 0611   PROT 5.8 (L) 10/05/2017 0611   ALBUMIN 2.3 (L) 10/05/2017 0611   AST 32 10/05/2017 0611   ALT 31 10/05/2017 0611   ALKPHOS 184 (H) 10/05/2017 0611   BILITOT 1.1 10/05/2017 0611   GFRNONAA 54 (L) 10/05/2017 0611   GFRAA >60 10/05/2017 0611   Lipase     Component Value Date/Time   LIPASE 20 10/01/2017 0535       Studies/Results: No  results found.  Anti-infectives: Anti-infectives (From admission, onward)   Start     Dose/Rate Route Frequency Ordered Stop   10/05/17 1000  cefTRIAXone (ROCEPHIN) 2 g in sodium chloride 0.9 % 100 mL IVPB     2 g 200 mL/hr over 30 Minutes Intravenous Every 24 hours 10/05/17 0806     10/03/17 1600  piperacillin-tazobactam (ZOSYN) IVPB 3.375 g     3.375 g 12.5 mL/hr over 240 Minutes Intravenous Every 8 hours 10/03/17 1535 10/04/17 0932   10/01/17 0600  piperacillin-tazobactam (ZOSYN) IVPB 3.375 g  Status:  Discontinued     3.375 g 12.5 mL/hr over 240 Minutes Intravenous Every 8 hours 09/30/17 2332 10/03/17 1525   09/30/17 2200  piperacillin-tazobactam (ZOSYN) IVPB 3.375 g     3.375 g 100 mL/hr over 30 Minutes Intravenous  Once 09/30/17 2152 09/30/17 2240       Assessment/Plan Type B aortic dissection Right renal mass/laparoscopic assisted right partial nephrectomy 07/28/2017, Dr. Alinda Money MultipleLiver metastasis with biliary obstruction-S/P ERCP x 2-suspected pancreatic cancer-pathology pending liver biopsy 09/22/2017 Type 2 diabetes Hypertension Struct of sleep apnea-CPAP at night Hxof umbilical and ventral hernias S/P TURP 09/08/2017 GERD  Cholecystitis/cholelithiasis Laparoscopic cholecystectomy, suture repair of epigastric hernia, 10/03/2017 Dr. Excell Seltzer  POD #2 -jp looks good.  WBC increase, but do not suspect bile leak or anything major at this time.  It would be very early for a complication.  However, given elevation we will resume his zosyn. -cont solid diet -add robaxin for MSK pain and spasms. -cont to mobilize and pulm toilet.   FEN:IV fluids/carb mod ID: Zosyn 7/19=>> day 5 - discontinue 24 hours post op, restart 7/24 --> DVT: SCD/Lovenox Follow up: TBD   LOS: 5 days    Henreitta Cea , [facility] 10/05/2017, 8:22 AM Pager: [phone]

## 2017-10-05 NOTE — Progress Notes (Signed)
PROGRESS NOTE    Jerry Weiss  WRU:[id number] DOB: 09/11/1955 DOA: 09/30/2017 PCP: [name], MD   Brief Narrative:  HPI On 09/30/2017 by [name] [name] is a 62 y.o. male with medical history significant for hypertension, type 2 diabetes mellitus, type B aortic dissection, RCC status-post partial nephrectomy, and admission earlier this month for obstructive jaundice treated with CBD stent, now presenting to the ED with abdominal pain, nausea, and non-bloody vomiting.  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  He was admitted to the hospital 10 days ago with obstructive jaundice, had biliary stent placed, had a liver mass biopsied by IR with pathology not yet back, and was discharged home for outpatient follow-up.  He had been doing fairly well back at home until developing back pain yesterday and then severe abdominal pain today with nausea and nonbloody vomiting. No fever or diarrhea reported.   Interim history Patient admitted for acute cholecystitis, general surgery on board.  Restarting antibiotics today.  Assessment & Plan   Acute cholecystitis  -General surgery consulted and appreciated, status post laparoscopic cholecystectomy on 10/03/2017 -Continue pain control with oxycodone and Robaxin -Patient with slight elevation of WBC today, will restart Zosyn  Biliary obstruction status post stent -Patient with CBD stent placed earlier this month, stent has migrated causing cystic duct occlusion.  -Gastroenterology consulted and appreciated-recommended supportive management with IV fluids and antibiotics.  Did not feel indication for repeat ERCP he did not feel that biliary stent was an issue at this time given the lack of biliary dilatation and improvement in LFTs.  Liver mass -presumed metastasis -Status post biopsy by interventional  radiology on 09/22/2017 -Biopsy was indeterminant although suspicious for malignancy, it has been sent to [place] for review, results are expected by the end of this week  Essential hypertension -Stable, continue amlodipine, lisinopril, hydralazine, labetalol  Diabetes mellitus, type II -Hemoglobin A1c 6.8 in June 2019 -Actos, Januvia held -Continue insulin sliding scale and CBG monitoring  Acute kidney injury -Creatinine had bumped to 1.4, currently 1.36 -Baseline creatinine 1 -Continue to monitor BMP  Normocytic anemia -Stable, continue to monitor CBC  Transaminitis -Stable, suspect from liver metastasis  Hypokalemia  DVT Prophylaxis  Lovenox  Code Status: Full  Family Communication: None at bedside  Disposition Plan: Admitted. Pending improvment  Consultants General surgery Gastroenterology  Procedures  Laparoscopic cholecystectomy  Antibiotics   Anti-infectives (From admission, onward)   Start     Dose/Rate Route Frequency Ordered Stop   10/05/17 1000  cefTRIAXone (ROCEPHIN) 2 g in sodium chloride 0.9 % 100 mL IVPB  Status:  Discontinued     2 g 200 mL/hr over 30 Minutes Intravenous Every 24 hours 10/05/17 0806 10/05/17 0828   10/05/17 0830  piperacillin-tazobactam (ZOSYN) IVPB 3.375 g     3.375 g 12.5 mL/hr over 240 Minutes Intravenous Every 8 hours 10/05/17 0828     10/03/17 1600  piperacillin-tazobactam (ZOSYN) IVPB 3.375 g     3.375 g 12.5 mL/hr over 240 Minutes Intravenous Every 8 hours 10/03/17 1535 10/04/17 0932   10/01/17 0600  piperacillin-tazobactam (ZOSYN) IVPB 3.375 g  Status:  Discontinued     3.375 g 12.5 mL/hr over 240 Minutes Intravenous Every 8 hours 09/30/17 2332 10/03/17 1525   09/30/17 2200  piperacillin-tazobactam (ZOSYN) IVPB 3.375 g     3.375 g 100 mL/hr  over 30 Minutes Intravenous  Once 09/30/17 2152 09/30/17 2240      Subjective:   Jerry Weiss seen and examined today.  Patient continues to complain of pain in his abdomen  particular the right upper quadrant.  States he feels it is all musculoskeletal and he feels that he tweaked something as he was reaching earlier today.  He has not had a bowel movement but has been having gas.  Currently denies chest pain, shortness breath, nausea or vomiting, dizziness or headache.  Objective:   Vitals:   10/04/17 0451 10/04/17 1300 10/04/17 2107 10/05/17 0543  BP: (!) 150/71 125/71 131/74 (!) 145/75  Pulse: 62 68 66 74  Resp: 18 17 16 16   Temp: 98.2 F (36.8 C) 98.2 F (36.8 C) 98.8 F (37.1 C) 98.5 F (36.9 C)  TempSrc: Oral Oral Oral Oral  SpO2: 92% 96% 94% 94%  Weight:      Height:        Intake/Output Summary (Last 24 hours) at 10/05/2017 1419 Last data filed at 10/05/2017 1410 Gross per 24 hour  Intake 1100 ml  Output 190 ml  Net 910 ml   Filed Weights   10/01/17 0018 10/02/17 0537 10/03/17 0436  Weight: 76.8 kg (169 lb 5 oz) 79.6 kg (175 lb 7.8 oz) 80 kg (176 lb 5.9 oz)    Exam  General: Well developed, well nourished, NAD, appears stated age  73: NCAT,  mucous membranes moist.   Neck: Supple  Cardiovascular: S1 S2 auscultated, no rubs, murmurs or gallops. Regular rate and rhythm.  Respiratory: Clear to auscultation bilaterally with equal chest rise  Abdomen: Soft, RUQ TTP, nondistended, + bowel sounds, JP drain with serosanguineous fluid  Extremities: warm dry without cyanosis clubbing or edema  Neuro: AAOx3, nonfocal  Psych: Normal affect and demeanor with intact judgement and insight   Data Reviewed: I have personally reviewed following labs and imaging studies  CBC: Recent Labs  Lab 09/30/17 1949 10/01/17 0535 10/02/17 0744 10/03/17 0538 10/04/17 0645 10/05/17 0611  WBC 19.2* 21.7* 17.1* 14.9* 17.4* 19.6*  NEUTROABS 16.6* 18.7*  --   --   --   --   HGB 11.3* 10.2* 8.8* 9.0* 8.9* 8.9*  HCT 35.7* 32.8* 28.0* 29.0* 27.8* 28.3*  MCV 94.2 95.1 94.6 94.5 93.0 94.3  PLT 313 297 240 262 275 093   Basic Metabolic  Panel: Recent Labs  Lab 10/01/17 0535 10/02/17 0744 10/03/17 0538 10/04/17 0645 10/05/17 0611  NA 137 137 137 138 139  K 3.4* 3.4* 3.5 3.4* 3.8  CL 104 104 102 104 105  CO2 24 24 26 26 26   GLUCOSE 127* 97 102* 85 124*  BUN 11 15 13 14 16   CREATININE 1.05 1.42* 1.40* 1.30* 1.36*  CALCIUM 8.6* 8.0* 8.3* 8.7* 8.8*   GFR: Estimated Creatinine Clearance: 57.1 mL/min (A) (by C-G formula based on SCr of 1.36 mg/dL (H)). Liver Function Tests: Recent Labs  Lab 09/30/17 1715 10/01/17 0535 10/02/17 0744 10/04/17 0645 10/05/17 0611  AST 37 28 23 44* 32  ALT 51* 39 28 33 31  ALKPHOS 335* 265* 191* 193* 184*  BILITOT 1.8* 1.8* 1.4* 1.1 1.1  PROT 7.0 6.0* 5.3* 5.9* 5.8*  ALBUMIN 3.4* 2.8* 2.3* 2.2* 2.3*   Recent Labs  Lab 09/30/17 1715 10/01/17 0535  LIPASE 21 20   No results for input(s): AMMONIA in the last 168 hours. Coagulation Profile: No results for input(s): INR, PROTIME in the last 168 hours. Cardiac Enzymes: No results  for input(s): CKTOTAL, CKMB, CKMBINDEX, TROPONINI in the last 168 hours. BNP (last 3 results) No results for input(s): PROBNP in the last 8760 hours. HbA1C: No results for input(s): HGBA1C in the last 72 hours. CBG: Recent Labs  Lab 10/04/17 0756 10/04/17 1206 10/04/17 1610 10/05/17 0810 10/05/17 1221  GLUCAP 90 177* 136* 104* 127*   Lipid Profile: No results for input(s): CHOL, HDL, LDLCALC, TRIG, CHOLHDL, LDLDIRECT in the last 72 hours. Thyroid Function Tests: No results for input(s): TSH, T4TOTAL, FREET4, T3FREE, THYROIDAB in the last 72 hours. Anemia Panel: No results for input(s): VITAMINB12, FOLATE, FERRITIN, TIBC, IRON, RETICCTPCT in the last 72 hours. Urine analysis:    Component Value Date/Time   COLORURINE YELLOW 09/30/2017 2106   APPEARANCEUR CLEAR 09/30/2017 2106   LABSPEC 1.016 09/30/2017 2106   PHURINE 5.0 09/30/2017 2106   GLUCOSEU NEGATIVE 09/30/2017 2106   HGBUR SMALL (A) 09/30/2017 2106   [place] NEGATIVE  09/30/2017 2106   [place] NEGATIVE 09/30/2017 2106   PROTEINUR 30 (A) 09/30/2017 2106   NITRITE NEGATIVE 09/30/2017 2106   LEUKOCYTESUR SMALL (A) 09/30/2017 2106   Sepsis Labs: @LABRCNTIP (procalcitonin:4,lacticidven:4)  ) Recent Results (from the past 240 hour(s))  Culture, blood (routine x 2)     Status: None (Preliminary result)   Collection Time: 09/30/17 11:45 PM  Result Value Ref Range Status   Specimen Description BLOOD LEFT ARM  Final   Special Requests   Final    BOTTLES DRAWN AEROBIC AND ANAEROBIC Blood Culture adequate volume   Culture   Final    NO GROWTH 3 DAYS Performed at [facility], [place] [address] [place], [place] [address]    Report Status PENDING  Incomplete  Culture, blood (routine x 2)     Status: None (Preliminary result)   Collection Time: 10/01/17 12:00 AM  Result Value Ref Range Status   Specimen Description BLOOD LEFT HAND  Final   Special Requests   Final    BOTTLES DRAWN AEROBIC ONLY Blood Culture adequate volume   Culture   Final    NO GROWTH 3 DAYS Performed at [facility], [place] [address] [place], [place] [address]    Report Status PENDING  Incomplete  Surgical pcr screen     Status: None   Collection Time: 10/02/17  5:04 PM  Result Value Ref Range Status   MRSA, PCR NEGATIVE NEGATIVE Final   Staphylococcus aureus NEGATIVE NEGATIVE Final    Comment: (NOTE) The Xpert SA Assay (FDA approved for NASAL specimens in patients 22 years of age and older), is one component of a comprehensive surveillance program. It is not intended to diagnose infection nor to guide or monitor treatment. Performed at [facility], [place] [address], [place], [place] [address]       Radiology Studies: No results found.   Scheduled Meds: . amLODipine  10 mg Oral Daily  . enoxaparin (LOVENOX) injection  40 mg Subcutaneous Q24H  . famotidine  20 mg Oral Daily  . hydrALAZINE  100 mg Oral TID  . insulin aspart  0-9 Units Subcutaneous TID WC   . labetalol  400 mg Oral TID  . lisinopril  40 mg Oral Daily  . mouth rinse  15 mL Mouth Rinse BID   Continuous Infusions: . lactated ringers 10 mL/hr at 10/03/17 0905  . piperacillin-tazobactam (ZOSYN)  IV 3.375 g (10/05/17 0843)     LOS: 5 days   Time Spent in minutes   30 minutes  Lucee Brissett  Breane Grunwald D.O. on 10/05/2017 at 2:19 PM  Between 7am to 7pm - Pager - [phone]  After 7pm go to [email or web address] - password [email or web address]  And look for the night coverage person covering for me after hours  [facility] Office  [phone]

## 2017-10-06 LAB — COMPREHENSIVE METABOLIC PANEL
ALBUMIN: 2.2 g/dL — AB (ref 3.5–5.0)
ALK PHOS: 181 U/L — AB (ref 38–126)
ALT: 25 U/L (ref 0–44)
ANION GAP: 9 (ref 5–15)
AST: 22 U/L (ref 15–41)
BUN: 16 mg/dL (ref 8–23)
CHLORIDE: 101 mmol/L (ref 98–111)
CO2: 27 mmol/L (ref 22–32)
Calcium: 8.3 mg/dL — ABNORMAL LOW (ref 8.9–10.3)
Creatinine, Ser: 1.25 mg/dL — ABNORMAL HIGH (ref 0.61–1.24)
GFR calc non Af Amer: >60 mL/min (ref >60)
GLUCOSE: 118 mg/dL — AB (ref 70–99)
POTASSIUM: 3.5 mmol/L (ref 3.5–5.1)
SODIUM: 137 mmol/L (ref 135–145)
Total Bilirubin: 1.1 mg/dL (ref 0.3–1.2)
Total Protein: 5.9 g/dL — ABNORMAL LOW (ref 6.5–8.1)

## 2017-10-06 LAB — CULTURE, BLOOD (ROUTINE X 2)
Culture: NO GROWTH
Culture: NO GROWTH
Special Requests: ADEQUATE
Special Requests: ADEQUATE

## 2017-10-06 LAB — CBC
HCT: 28.8 % — ABNORMAL LOW (ref 39.0–52.0)
HEMOGLOBIN: 9.2 g/dL — AB (ref 13.0–17.0)
MCH: 29.9 pg (ref 26.0–34.0)
MCHC: 31.9 g/dL (ref 30.0–36.0)
MCV: 93.5 fL (ref 78.0–100.0)
PLATELETS: 317 10*3/uL (ref 150–400)
RBC: 3.08 MIL/uL — ABNORMAL LOW (ref 4.22–5.81)
RDW: 14.7 % (ref 11.5–15.5)
WBC: 16.3 10*3/uL — ABNORMAL HIGH (ref 4.0–10.5)

## 2017-10-06 LAB — GLUCOSE, CAPILLARY
GLUCOSE-CAPILLARY: 122 mg/dL — AB (ref 70–99)
Glucose-Capillary: 107 mg/dL — ABNORMAL HIGH (ref 70–99)
Glucose-Capillary: 115 mg/dL — ABNORMAL HIGH (ref 70–99)
Glucose-Capillary: 134 mg/dL — ABNORMAL HIGH (ref 70–99)

## 2017-10-06 MED ORDER — SENNA 8.6 MG PO TABS
1.0000 | ORAL_TABLET | Freq: Two times a day (BID) | ORAL | Status: DC
  Administered 2017-10-06 – 2017-10-14 (×16): 8.6 mg via ORAL
  Filled 2017-10-06 (×17): qty 1

## 2017-10-06 NOTE — Progress Notes (Signed)
PROGRESS NOTE    Jerry Weiss  ZOX:[id number] DOB: June 22, 1955 DOA: 09/30/2017 PCP: [name], MD   Brief Narrative:  HPI On 09/30/2017 by [name] Opyd Jerry Weiss is a 62 y.o. male with medical history significant for hypertension, type 2 diabetes mellitus, type B aortic dissection, RCC status-post partial nephrectomy, and admission earlier this month for obstructive jaundice treated with CBD stent, now presenting to the ED with abdominal pain, nausea, and non-bloody vomiting.  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  He was admitted to the hospital 10 days ago with obstructive jaundice, had biliary stent placed, had a liver mass biopsied by IR with pathology not yet back, and was discharged home for outpatient follow-up.  He had been doing fairly well back at home until developing back pain yesterday and then severe abdominal pain today with nausea and nonbloody vomiting. No fever or diarrhea reported.   Interim history Patient admitted for acute cholecystitis, general surgery on board.  Restarted antibiotics on 7/24 given increase in WBC.   Assessment & Plan   Acute cholecystitis  -General surgery consulted and appreciated, status post laparoscopic cholecystectomy on 10/03/2017 -Continue pain control with oxycodone and Robaxin -WBC increased to 19 on 7/24, restarted zosyn -currently leukocytosis improving mildly to 16.3 -able to tolerate solid diet, has not had a bowel movement  Biliary obstruction status post stent -Patient with CBD stent placed earlier this month, stent has migrated causing cystic duct occlusion.  -Gastroenterology consulted and appreciated-recommended supportive management with IV fluids and antibiotics.  Did not feel indication for repeat ERCP he did not feel that biliary stent was an issue at this time given the lack of biliary  dilatation and improvement in LFTs.  Liver mass -presumed metastasis -Status post biopsy by interventional radiology on 09/22/2017 -Biopsy was indeterminant although suspicious for malignancy, it has been sent to [place] for review, results are expected by the end of this week  Essential hypertension -Stable, continue amlodipine, lisinopril, hydralazine, labetalol  Diabetes mellitus, type II -Hemoglobin A1c 6.8 in June 2019 -Actos, Januvia held -Continue insulin sliding scale and CBG monitoring  Acute kidney injury -Creatinine had bumped to 1.4, currently 1.25 -Baseline creatinine 1 -Continue to monitor BMP  Normocytic anemia -Stable, hemoglobin currently 9.2 -continue to monitor CBC  Transaminitis -Stable, suspect from liver metastasis  Hypokalemia -replaced, continue to monitor BMP  DVT Prophylaxis  Lovenox  Code Status: Full  Family Communication: None at bedside  Disposition Plan: Admitted. Pending improvment  Consultants General surgery Gastroenterology  Procedures  Laparoscopic cholecystectomy  Antibiotics   Anti-infectives (From admission, onward)   Start     Dose/Rate Route Frequency Ordered Stop   10/05/17 1000  cefTRIAXone (ROCEPHIN) 2 g in sodium chloride 0.9 % 100 mL IVPB  Status:  Discontinued     2 g 200 mL/hr over 30 Minutes Intravenous Every 24 hours 10/05/17 0806 10/05/17 0828   10/05/17 0830  piperacillin-tazobactam (ZOSYN) IVPB 3.375 g     3.375 g 12.5 mL/hr over 240 Minutes Intravenous Every 8 hours 10/05/17 0828     10/03/17 1600  piperacillin-tazobactam (ZOSYN) IVPB 3.375 g     3.375 g 12.5 mL/hr over 240 Minutes Intravenous Every 8 hours 10/03/17 1535 10/04/17 0932   10/01/17 0600  piperacillin-tazobactam (ZOSYN) IVPB 3.375 g  Status:  Discontinued     3.375 g 12.5 mL/hr  over 240 Minutes Intravenous Every 8 hours 09/30/17 2332 10/03/17 1525   09/30/17 2200  piperacillin-tazobactam (ZOSYN) IVPB 3.375 g     3.375 g 100 mL/hr over 30  Minutes Intravenous  Once 09/30/17 2152 09/30/17 2240      Subjective:   Jerry Weiss seen and examined today.  He is to complain of pain and not having a good night.  Able to eat some of his eggs this morning.  Denies current abdominal pain, nausea or vomiting.  Has not had a bowel movement but does have gas.  Denies current chest pain, shortness of breath, dizziness or headache. States he did not receive Robaxin yesterday.  Informed patient that Robaxin is an as needed medication that he needs to ask for.  Objective:   Vitals:   10/05/17 1455 10/05/17 2045 10/06/17 0559 10/06/17 0819  BP: (!) 143/74 138/83 119/69   Pulse: 73 77 69   Resp: 16 16 16    Temp: 99.1 F (37.3 C) 99.2 F (37.3 C) 98.7 F (37.1 C)   TempSrc: Oral Oral Oral   SpO2: 95% 98% 94%   Weight:    76.2 kg (167 lb 15.9 oz)  Height:        Intake/Output Summary (Last 24 hours) at 10/06/2017 1120 Last data filed at 10/06/2017 0940 Gross per 24 hour  Intake 1174.69 ml  Output 1280 ml  Net -105.31 ml   Filed Weights   10/02/17 0537 10/03/17 0436 10/06/17 0819  Weight: 79.6 kg (175 lb 7.8 oz) 80 kg (176 lb 5.9 oz) 76.2 kg (167 lb 15.9 oz)   Exam  General: Well developed, well nourished, NAD  HEENT: NCAT, PERRLA, EOMI, Anicteic Sclera, mucous membranes moist.   Neck: Supple, no JVD, no masses  Cardiovascular: S1 S2 auscultated, no rubs, murmurs or gallops. Regular rate and rhythm.  Respiratory: Clear to auscultation bilaterally with equal chest rise  Abdomen: Soft, RUQ TTP, nondistended, + bowel sounds JP drain with serosanguineous fluid  Extremities: warm dry without cyanosis clubbing or edema  Neuro: AAOx3, nonfocal  Skin: Without rashes exudates or nodules  Psych: flat, however appropriate   Data Reviewed: I have personally reviewed following labs and imaging studies  CBC: Recent Labs  Lab 09/30/17 1949 10/01/17 0535 10/02/17 0744 10/03/17 0538 10/04/17 0645 10/05/17 0611 10/06/17 0400   WBC 19.2* 21.7* 17.1* 14.9* 17.4* 19.6* 16.3*  NEUTROABS 16.6* 18.7*  --   --   --   --   --   HGB 11.3* 10.2* 8.8* 9.0* 8.9* 8.9* 9.2*  HCT 35.7* 32.8* 28.0* 29.0* 27.8* 28.3* 28.8*  MCV 94.2 95.1 94.6 94.5 93.0 94.3 93.5  PLT 313 297 240 262 275 310 244   Basic Metabolic Panel: Recent Labs  Lab 10/02/17 0744 10/03/17 0538 10/04/17 0645 10/05/17 0611 10/06/17 0400  NA 137 137 138 139 137  K 3.4* 3.5 3.4* 3.8 3.5  CL 104 102 104 105 101  CO2 24 26 26 26 27   GLUCOSE 97 102* 85 124* 118*  BUN 15 13 14 16 16   CREATININE 1.42* 1.40* 1.30* 1.36* 1.25*  CALCIUM 8.0* 8.3* 8.7* 8.8* 8.3*   GFR: Estimated Creatinine Clearance: 57.3 mL/min (A) (by C-G formula based on SCr of 1.25 mg/dL (H)). Liver Function Tests: Recent Labs  Lab 10/01/17 0535 10/02/17 0744 10/04/17 0645 10/05/17 0611 10/06/17 0400  AST 28 23 44* 32 22  ALT 39 28 33 31 25  ALKPHOS 265* 191* 193* 184* 181*  BILITOT 1.8* 1.4* 1.1 1.1  1.1  PROT 6.0* 5.3* 5.9* 5.8* 5.9*  ALBUMIN 2.8* 2.3* 2.2* 2.3* 2.2*   Recent Labs  Lab 09/30/17 1715 10/01/17 0535  LIPASE 21 20   No results for input(s): AMMONIA in the last 168 hours. Coagulation Profile: No results for input(s): INR, PROTIME in the last 168 hours. Cardiac Enzymes: No results for input(s): CKTOTAL, CKMB, CKMBINDEX, TROPONINI in the last 168 hours. BNP (last 3 results) No results for input(s): PROBNP in the last 8760 hours. HbA1C: No results for input(s): HGBA1C in the last 72 hours. CBG: Recent Labs  Lab 10/05/17 0810 10/05/17 1221 10/05/17 1649 10/05/17 2237 10/06/17 0759  GLUCAP 104* 127* 152* 165* 107*   Lipid Profile: No results for input(s): CHOL, HDL, LDLCALC, TRIG, CHOLHDL, LDLDIRECT in the last 72 hours. Thyroid Function Tests: No results for input(s): TSH, T4TOTAL, FREET4, T3FREE, THYROIDAB in the last 72 hours. Anemia Panel: No results for input(s): VITAMINB12, FOLATE, FERRITIN, TIBC, IRON, RETICCTPCT in the last 72 hours. Urine  analysis:    Component Value Date/Time   COLORURINE YELLOW 09/30/2017 2106   APPEARANCEUR CLEAR 09/30/2017 2106   LABSPEC 1.016 09/30/2017 2106   PHURINE 5.0 09/30/2017 2106   GLUCOSEU NEGATIVE 09/30/2017 2106   HGBUR SMALL (A) 09/30/2017 2106   [place] NEGATIVE 09/30/2017 2106   [place] NEGATIVE 09/30/2017 2106   PROTEINUR 30 (A) 09/30/2017 2106   NITRITE NEGATIVE 09/30/2017 2106   LEUKOCYTESUR SMALL (A) 09/30/2017 2106   Sepsis Labs: @LABRCNTIP (procalcitonin:4,lacticidven:4)  ) Recent Results (from the past 240 hour(s))  Culture, blood (routine x 2)     Status: None (Preliminary result)   Collection Time: 09/30/17 11:45 PM  Result Value Ref Range Status   Specimen Description BLOOD LEFT ARM  Final   Special Requests   Final    BOTTLES DRAWN AEROBIC AND ANAEROBIC Blood Culture adequate volume   Culture   Final    NO GROWTH 4 DAYS Performed at [facility], [place] [address] [place], [place] [address]    Report Status PENDING  Incomplete  Culture, blood (routine x 2)     Status: None (Preliminary result)   Collection Time: 10/01/17 12:00 AM  Result Value Ref Range Status   Specimen Description BLOOD LEFT HAND  Final   Special Requests   Final    BOTTLES DRAWN AEROBIC ONLY Blood Culture adequate volume   Culture   Final    NO GROWTH 4 DAYS Performed at [facility], [place] [address], [place], [place] [address]    Report Status PENDING  Incomplete  Surgical pcr screen     Status: None   Collection Time: 10/02/17  5:04 PM  Result Value Ref Range Status   MRSA, PCR NEGATIVE NEGATIVE Final   Staphylococcus aureus NEGATIVE NEGATIVE Final    Comment: (NOTE) The Xpert SA Assay (FDA approved for NASAL specimens in patients 61 years of age and older), is one component of a comprehensive surveillance program. It is not intended to diagnose infection nor to guide or monitor treatment. Performed at [facility], [place] [address], [place], [place] [address]         Radiology Studies: No results found.   Scheduled Meds: . amLODipine  10 mg Oral Daily  . enoxaparin (LOVENOX) injection  40 mg Subcutaneous Q24H  . famotidine  20 mg Oral Daily  . hydrALAZINE  100 mg Oral TID  . insulin aspart  0-9 Units Subcutaneous TID WC  . labetalol  400 mg Oral TID  .  lisinopril  40 mg Oral Daily  . mouth rinse  15 mL Mouth Rinse BID  . senna  1 tablet Oral BID   Continuous Infusions: . lactated ringers 10 mL/hr at 10/03/17 0905  . piperacillin-tazobactam (ZOSYN)  IV 3.375 g (10/06/17 0527)     LOS: 6 days   Time Spent in minutes   30 minutes  Mostyn Varnell D.O. on 10/06/2017 at 11:20 AM  Between 7am to 7pm - Pager - [phone]  After 7pm go to [email or web address] - password [email or web address]  And look for the night coverage person covering for me after hours  [facility] Office  [phone]

## 2017-10-06 NOTE — Progress Notes (Signed)
PT Cancellation Note  Patient Details Name: Jerry Weiss MRN: [id number] DOB: Sep 15, 1955   Cancelled Treatment:    Reason Eval/Treat Not Completed: Patient declined, no reason specified. Pt refusing to participate again this afternoon. Pt stating that he wants to ambulate "at some point today" but unwilling to attempt with therapist at this time. PT offered to assist pt to bathroom as he stated that he needed to go soon; however, pt again refusing assistance from therapist. PT will continue to follow acutely.    Normal 10/06/2017, 3:10 PM

## 2017-10-06 NOTE — Progress Notes (Signed)
PT Cancellation Note  Patient Details Name: Jerry Weiss MRN: [id number] DOB: 05/19/1955   Cancelled Treatment:    Patient reported he had not had breakfast and was in a lot of pain from activity participated in previous day. PT encouraged pt to get into chair from bed, but pt requested to stay in bed. Contacted nurse for pain medication per pt request. PT will continuee to follow acutely.  [name] 10/06/2017, 9:08 AM

## 2017-10-06 NOTE — Progress Notes (Signed)
3 Days Post-Op  Subjective: Did not feel good last night.  Was anorexic and did not eat.  Says he feels much better this morning and looking forward to breakfast. Has been tolerating solid food.  No bowel movements.  Pain seems under control.  Afebrile.  Heart rate 69.  Normotensive. WBC down to 16,300.  Hemoglobin 9.2 which is stable.  Liver function tests normal except for alk phosphatase 181.  Potassium 3.5.  Creatinine stable, 1.25.  Objective: Vital signs in last 24 hours: Temp:  [98.7 F (37.1 C)-99.2 F (37.3 C)] 98.7 F (37.1 C) (07/25 0559) Pulse Rate:  [69-77] 69 (07/25 0559) Resp:  [16] 16 (07/25 0559) BP: (119-143)/(69-83) 119/69 (07/25 0559) SpO2:  [94 %-98 %] 94 % (07/25 0559) Last BM Date: 09/30/17  Intake/Output from previous day: 07/24 0701 - 07/25 0700 In: 1434.7 [P.O.:800; I.V.:510; IV Piggyback:124.7] Out: 1230 [Urine:1100; Drains:130] Intake/Output this shift: No intake/output data recorded.   PE: Heart: RRR no ectopy Lungs: CTAB Abd: soft,  tender in RUQ, not distended.  Basically soft and benign elsewhere.  JP drain serosanguineous, benign-appearing wounds look good +BS, JP drain with serosang output, 130 cc yesterday     Lab Results:  Results for orders placed or performed during the hospital encounter of 09/30/17 (from the past 24 hour(s))  Glucose, capillary     Status: Abnormal   Collection Time: 10/05/17  8:10 AM  Result Value Ref Range   Glucose-Capillary 104 (H) 70 - 99 mg/dL  Glucose, capillary     Status: Abnormal   Collection Time: 10/05/17 12:21 PM  Result Value Ref Range   Glucose-Capillary 127 (H) 70 - 99 mg/dL  Glucose, capillary     Status: Abnormal   Collection Time: 10/05/17  4:49 PM  Result Value Ref Range   Glucose-Capillary 152 (H) 70 - 99 mg/dL  Glucose, capillary     Status: Abnormal   Collection Time: 10/05/17 10:37 PM  Result Value Ref Range   Glucose-Capillary 165 (H) 70 - 99 mg/dL  CBC     Status: Abnormal   Collection Time: 10/06/17  4:00 AM  Result Value Ref Range   WBC 16.3 (H) 4.0 - 10.5 K/uL   RBC 3.08 (L) 4.22 - 5.81 MIL/uL   Hemoglobin 9.2 (L) 13.0 - 17.0 g/dL   HCT 28.8 (L) 39.0 - 52.0 %   MCV 93.5 78.0 - 100.0 fL   MCH 29.9 26.0 - 34.0 pg   MCHC 31.9 30.0 - 36.0 g/dL   RDW 14.7 11.5 - 15.5 %   Platelets 317 150 - 400 K/uL  Comprehensive metabolic panel     Status: Abnormal   Collection Time: 10/06/17  4:00 AM  Result Value Ref Range   Sodium 137 135 - 145 mmol/L   Potassium 3.5 3.5 - 5.1 mmol/L   Chloride 101 98 - 111 mmol/L   CO2 27 22 - 32 mmol/L   Glucose, Bld 118 (H) 70 - 99 mg/dL   BUN 16 8 - 23 mg/dL   Creatinine, Ser 1.25 (H) 0.61 - 1.24 mg/dL   Calcium 8.3 (L) 8.9 - 10.3 mg/dL   Total Protein 5.9 (L) 6.5 - 8.1 g/dL   Albumin 2.2 (L) 3.5 - 5.0 g/dL   AST 22 15 - 41 U/L   ALT 25 0 - 44 U/L   Alkaline Phosphatase 181 (H) 38 - 126 U/L   Total Bilirubin 1.1 0.3 - 1.2 mg/dL   GFR calc non Af Amer >60 >60 mL/min  GFR calc Af Amer >60 >60 mL/min   Anion gap 9 5 - 15     Studies/Results: No results found.  Marland Kitchen amLODipine  10 mg Oral Daily  . enoxaparin (LOVENOX) injection  40 mg Subcutaneous Q24H  . famotidine  20 mg Oral Daily  . hydrALAZINE  100 mg Oral TID  . insulin aspart  0-9 Units Subcutaneous TID WC  . labetalol  400 mg Oral TID  . lisinopril  40 mg Oral Daily  . mouth rinse  15 mL Mouth Rinse BID     Assessment/Plan: s/p Procedure(s): LAPAROSCOPIC CHOLECYSTECTOMY  Assessment/Plan Type B aortic dissection Right renal mass/laparoscopic assisted right partial nephrectomy 07/28/2017, Dr. Alinda Money MultipleLiver metastasis with biliary obstruction-S/P ERCP x 2-suspected pancreatic cancer-pathology pending liver biopsy 09/22/2017 Type 2 diabetes Hypertension Struct of sleep apnea-CPAP at night Hxof umbilical and ventral hernias S/P TURP 09/08/2017 GERD  Cholecystitis/cholelithiasis POD#3 - Laparoscopic cholecystectomy, suture repair of  epigastric hernia, 10/03/2017 Dr. Excell Seltzer -jp looks good.  WBC down a bit. but do not suspect bile leak or anything major at this time.  -Continue empiric Zosyn. -cont solid diet -add robaxin for MSK pain and spasms. -cont to mobilize and pulm toilet. -lovenox -sennakot  FEN:IV fluids/carb mod ID: Zosyn 7/19=>> day5 - discontinue 24 hours post op, restart 7/24 --> DVT: SCD/Lovenox Follow up: TBD    _0 @  LOS: 6 days    Jerry Weiss 10/06/2017  . .prob

## 2017-10-07 DIAGNOSIS — K810 Acute cholecystitis: Secondary | ICD-10-CM

## 2017-10-07 LAB — GLUCOSE, CAPILLARY
GLUCOSE-CAPILLARY: 102 mg/dL — AB (ref 70–99)
GLUCOSE-CAPILLARY: 118 mg/dL — AB (ref 70–99)
Glucose-Capillary: 97 mg/dL (ref 70–99)
Glucose-Capillary: 145 mg/dL — ABNORMAL HIGH (ref 70–99)

## 2017-10-07 LAB — CBC
HCT: 30.2 % — ABNORMAL LOW (ref 39.0–52.0)
Hemoglobin: 9.4 g/dL — ABNORMAL LOW (ref 13.0–17.0)
MCH: 29.5 pg (ref 26.0–34.0)
MCHC: 31.1 g/dL (ref 30.0–36.0)
MCV: 94.7 fL (ref 78.0–100.0)
PLATELETS: 322 10*3/uL (ref 150–400)
RBC: 3.19 MIL/uL — AB (ref 4.22–5.81)
RDW: 14.7 % (ref 11.5–15.5)
WBC: 16.7 10*3/uL — AB (ref 4.0–10.5)

## 2017-10-07 LAB — COMPREHENSIVE METABOLIC PANEL
ALT: 23 U/L (ref 0–44)
AST: 22 U/L (ref 15–41)
Albumin: 2.1 g/dL — ABNORMAL LOW (ref 3.5–5.0)
Alkaline Phosphatase: 166 U/L — ABNORMAL HIGH (ref 38–126)
Anion gap: 11 (ref 5–15)
BUN: 19 mg/dL (ref 8–23)
CHLORIDE: 100 mmol/L (ref 98–111)
CO2: 27 mmol/L (ref 22–32)
CREATININE: 1.42 mg/dL — AB (ref 0.61–1.24)
Calcium: 8.3 mg/dL — ABNORMAL LOW (ref 8.9–10.3)
GFR calc Af Amer: 60 mL/min — ABNORMAL LOW (ref >60)
GFR, EST NON AFRICAN AMERICAN: 51 mL/min — AB (ref >60)
GLUCOSE: 111 mg/dL — AB (ref 70–99)
POTASSIUM: 3.4 mmol/L — AB (ref 3.5–5.1)
Sodium: 138 mmol/L (ref 135–145)
Total Bilirubin: 1.1 mg/dL (ref 0.3–1.2)
Total Protein: 5.9 g/dL — ABNORMAL LOW (ref 6.5–8.1)

## 2017-10-07 LAB — MAGNESIUM: MAGNESIUM: 1.8 mg/dL (ref 1.7–2.4)

## 2017-10-07 MED ORDER — SODIUM CHLORIDE 0.9 % IV SOLN
INTRAVENOUS | Status: DC
  Administered 2017-10-07 – 2017-10-12 (×10): via INTRAVENOUS

## 2017-10-07 MED ORDER — METHOCARBAMOL 750 MG PO TABS
750.0000 mg | ORAL_TABLET | Freq: Three times a day (TID) | ORAL | Status: DC
  Administered 2017-10-07 – 2017-10-09 (×7): 750 mg via ORAL
  Filled 2017-10-07 (×7): qty 1

## 2017-10-07 MED ORDER — POTASSIUM CHLORIDE CRYS ER 20 MEQ PO TBCR
20.0000 meq | EXTENDED_RELEASE_TABLET | Freq: Once | ORAL | Status: AC
  Administered 2017-10-07: 20 meq via ORAL
  Filled 2017-10-07: qty 1

## 2017-10-07 MED ORDER — POLYETHYLENE GLYCOL 3350 17 G PO PACK
17.0000 g | PACK | Freq: Every day | ORAL | Status: DC
  Administered 2017-10-07 – 2017-10-13 (×6): 17 g via ORAL
  Filled 2017-10-07 (×8): qty 1

## 2017-10-07 NOTE — Plan of Care (Signed)
  Problem: Education: Goal: Knowledge of General Education information will improve Description Including pain rating scale, medication(s)/side effects and non-pharmacologic comfort measures Outcome: Progressing   Problem: Health Behavior/Discharge Planning: Goal: Ability to manage health-related needs will improve Outcome: Progressing   Problem: Clinical Measurements: Goal: Ability to maintain clinical measurements within normal limits will improve Outcome: Progressing Goal: Will remain free from infection Outcome: Progressing Goal: Diagnostic test results will improve Outcome: Progressing Goal: Respiratory complications will improve Outcome: Progressing Goal: Cardiovascular complication will be avoided Outcome: Progressing   Problem: Activity: Goal: Risk for activity intolerance will decrease Outcome: Progressing   Problem: Nutrition: Goal: Adequate nutrition will be maintained Outcome: Progressing   Problem: Coping: Goal: Level of anxiety will decrease Outcome: Progressing   Problem: Elimination: Goal: Will not experience complications related to bowel motility Outcome: Progressing Goal: Will not experience complications related to urinary retention Outcome: Progressing   Problem: Pain Managment: Goal: General experience of comfort will improve Outcome: Progressing   Problem: Safety: Goal: Ability to remain free from injury will improve Outcome: Progressing   Problem: Skin Integrity: Goal: Risk for impaired skin integrity will decrease Outcome: Progressing   Problem: Clinical Measurements: Goal: Postoperative complications will be avoided or minimized Outcome: Progressing   Problem: Skin Integrity: Goal: Demonstration of wound healing without infection will improve Outcome: Progressing

## 2017-10-07 NOTE — Progress Notes (Signed)
PROGRESS NOTE    Jerry Weiss  YIF:[id number] DOB: [birth year] DOA: 09/30/2017 PCP: [name], MD   Brief Narrative:  HPI On 09/30/2017 by [name] Opyd Jerry Weiss is a 62 y.o. male with medical history significant for hypertension, type 2 diabetes mellitus, type B aortic dissection, RCC status-post partial nephrectomy, and admission earlier this month for obstructive jaundice treated with CBD stent, now presenting to the ED with abdominal pain, nausea, and non-bloody vomiting.  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  He was admitted to the hospital 10 days ago with obstructive jaundice, had biliary stent placed, had a liver mass biopsied by IR with pathology not yet back, and was discharged home for outpatient follow-up.  He had been doing fairly well back at home until developing back pain yesterday and then severe abdominal pain today with nausea and nonbloody vomiting. No fever or diarrhea reported.   Interim history Patient admitted for acute cholecystitis, general surgery on board.  Restarted antibiotics on 7/24 given increase in WBC.   Assessment & Plan   Acute cholecystitis  -General surgery consulted and appreciated, status post laparoscopic cholecystectomy on 10/03/2017 -Continue pain control with oxycodone and Robaxin -WBC increased to 19 on 7/24, restarted zosyn -currently leukocytosis stable, 16.7 (?if leukocytosis is related to liver mets) -able to tolerate solid diet, has not had a bowel movement however passing gas  Biliary obstruction status post stent -Patient with CBD stent placed earlier this month, stent has migrated causing cystic duct occlusion.  -Gastroenterology consulted and appreciated-recommended supportive management with IV fluids and antibiotics.  Did not feel indication for repeat ERCP he did not feel that biliary stent  was an issue at this time given the lack of biliary dilatation and improvement in LFTs.  Liver mass/metastasis -Status post biopsy by interventional radiology on 09/22/2017 -Biopsy was indeterminant although suspicious for malignancy. Results are back from [place]: ""These morphologic and immunophenotypical findings are consistent with a diagnosis of metastatic poorly differentiated carcinoma. The differential diagnosis includes collecting duct carcinoma and urothelial carcinoma. Tumors arising in the collecting ducts in the urothelium of the calyces can co-express PAX-8, GATA3 and p63. It is not possible to distinguish between these possibilities on this biopsy. 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 -Discussed with Dr. [name] who will likely follow up with patient as an outpatient  Essential hypertension -Stable, continue amlodipine, hydralazine, labetalol -will hold lisinopril due to AKI  Diabetes mellitus, type II -Hemoglobin A1c 6.8 in June 2019 -Actos, Januvia held -Continue insulin sliding scale and CBG monitoring  Acute kidney injury -Creatinine had bumped to 1.42 -Baseline creatinine 1 -will restart gentle IVF -Continue to monitor BMP  Normocytic anemia -Stable, hemoglobin currently 9.4 -continue to monitor CBC  Transaminitis -Stable, suspect from liver metastasis  Hypokalemia -will replace and continue to monitor   DVT Prophylaxis  Lovenox  Code Status: Full  Family Communication: None at bedside  Disposition Plan: Admitted. Pending improvment  Consultants General surgery Gastroenterology  Procedures  Laparoscopic cholecystectomy  Antibiotics   Anti-infectives (From admission, onward)   Start     Dose/Rate Route  Frequency Ordered Stop   10/05/17 1000  cefTRIAXone (  ROCEPHIN) 2 g in sodium chloride 0.9 % 100 mL IVPB  Status:  Discontinued     2 g 200 mL/hr over 30 Minutes Intravenous Every 24 hours 10/05/17 0806 10/05/17 0828   10/05/17 0830  piperacillin-tazobactam (ZOSYN) IVPB 3.375 g     3.375 g 12.5 mL/hr over 240 Minutes Intravenous Every 8 hours 10/05/17 0828     10/03/17 1600  piperacillin-tazobactam (ZOSYN) IVPB 3.375 g     3.375 g 12.5 mL/hr over 240 Minutes Intravenous Every 8 hours 10/03/17 1535 10/04/17 0932   10/01/17 0600  piperacillin-tazobactam (ZOSYN) IVPB 3.375 g  Status:  Discontinued     3.375 g 12.5 mL/hr over 240 Minutes Intravenous Every 8 hours 09/30/17 2332 10/03/17 1525   09/30/17 2200  piperacillin-tazobactam (ZOSYN) IVPB 3.375 g     3.375 g 100 mL/hr over 30 Minutes Intravenous  Once 09/30/17 2152 09/30/17 2240      Subjective:   Jerry Weiss seen and examined today.  Continues to complain of not feeling well and having nausea.  Has not had a bowel movement but continues to have gas passage.  Denies current chest pain, shortness of breath, dizziness or headache, vomiting.  Feels abdominal pain at incision sites.  Objective:   Vitals:   10/06/17 0819 10/06/17 1453 10/06/17 2144 10/07/17 0501  BP:  118/68 136/69 128/68  Pulse:  72 71 73  Resp:  15 17 18   Temp:  98.4 F (36.9 C) 98.6 F (37 C) 99.1 F (37.3 C)  TempSrc:  Oral Oral Oral  SpO2:  92% 90% 90%  Weight: 76.2 kg (167 lb 15.9 oz)     Height:        Intake/Output Summary (Last 24 hours) at 10/07/2017 1047 Last data filed at 10/07/2017 0600 Gross per 24 hour  Intake 1435.44 ml  Output 550 ml  Net 885.44 ml   Filed Weights   10/02/17 0537 10/03/17 0436 10/06/17 0819  Weight: 79.6 kg (175 lb 7.8 oz) 80 kg (176 lb 5.9 oz) 76.2 kg (167 lb 15.9 oz)   Exam  General: Well developed, well nourished, NAD, appears stated age  HEENT: NCAT, mucous membranes moist.   Neck: Supple  Cardiovascular: S1  S2 auscultated, no rubs, murmurs or gallops. Regular rate and rhythm.  Respiratory: Clear to auscultation bilaterally with equal chest rise  Abdomen: Soft,RUQ TTP, nondistended, + bowel sounds, JP drain with serosanguineous fluid  Extremities: warm dry without cyanosis clubbing or edema  Neuro: AAOx3, nonfocal  Psych: flat affect, however appropriate  Data Reviewed: I have personally reviewed following labs and imaging studies  CBC: Recent Labs  Lab 09/30/17 1949 10/01/17 0535  10/03/17 0538 10/04/17 0645 10/05/17 0611 10/06/17 0400 10/07/17 0454  WBC 19.2* 21.7*   < > 14.9* 17.4* 19.6* 16.3* 16.7*  NEUTROABS 16.6* 18.7*  --   --   --   --   --   --   HGB 11.3* 10.2*   < > 9.0* 8.9* 8.9* 9.2* 9.4*  HCT 35.7* 32.8*   < > 29.0* 27.8* 28.3* 28.8* 30.2*  MCV 94.2 95.1   < > 94.5 93.0 94.3 93.5 94.7  PLT 313 297   < > 262 275 310 317 322   < > = values in this interval not displayed.   Basic Metabolic Panel: Recent Labs  Lab 10/03/17 0538 10/04/17 0645 10/05/17 0611 10/06/17 0400 10/07/17 0454  NA 137 138 139 137 138  K 3.5 3.4* 3.8 3.5 3.4*  CL 102 104  105 101 100  CO2 26 26 26 27 27   GLUCOSE 102* 85 124* 118* 111*  BUN 13 14 16 16 19   CREATININE 1.40* 1.30* 1.36* 1.25* 1.42*  CALCIUM 8.3* 8.7* 8.8* 8.3* 8.3*  MG  --   --   --   --  1.8   GFR: Estimated Creatinine Clearance: 50.4 mL/min (A) (by C-G formula based on SCr of 1.42 mg/dL (H)). Liver Function Tests: Recent Labs  Lab 10/02/17 0744 10/04/17 0645 10/05/17 0611 10/06/17 0400 10/07/17 0454  AST 23 44* 32 22 22  ALT 28 33 31 25 23   ALKPHOS 191* 193* 184* 181* 166*  BILITOT 1.4* 1.1 1.1 1.1 1.1  PROT 5.3* 5.9* 5.8* 5.9* 5.9*  ALBUMIN 2.3* 2.2* 2.3* 2.2* 2.1*   Recent Labs  Lab 09/30/17 1715 10/01/17 0535  LIPASE 21 20   No results for input(s): AMMONIA in the last 168 hours. Coagulation Profile: No results for input(s): INR, PROTIME in the last 168 hours. Cardiac Enzymes: No results for  input(s): CKTOTAL, CKMB, CKMBINDEX, TROPONINI in the last 168 hours. BNP (last 3 results) No results for input(s): PROBNP in the last 8760 hours. HbA1C: No results for input(s): HGBA1C in the last 72 hours. CBG: Recent Labs  Lab 10/06/17 0759 10/06/17 1230 10/06/17 1722 10/06/17 2149 10/07/17 0817  GLUCAP 107* 115* 134* 122* 97   Lipid Profile: No results for input(s): CHOL, HDL, LDLCALC, TRIG, CHOLHDL, LDLDIRECT in the last 72 hours. Thyroid Function Tests: No results for input(s): TSH, T4TOTAL, FREET4, T3FREE, THYROIDAB in the last 72 hours. Anemia Panel: No results for input(s): VITAMINB12, FOLATE, FERRITIN, TIBC, IRON, RETICCTPCT in the last 72 hours. Urine analysis:    Component Value Date/Time   COLORURINE YELLOW 09/30/2017 2106   APPEARANCEUR CLEAR 09/30/2017 2106   LABSPEC 1.016 09/30/2017 2106   PHURINE 5.0 09/30/2017 2106   GLUCOSEU NEGATIVE 09/30/2017 2106   HGBUR SMALL (A) 09/30/2017 2106   [place] NEGATIVE 09/30/2017 2106   [place] NEGATIVE 09/30/2017 2106   PROTEINUR 30 (A) 09/30/2017 2106   NITRITE NEGATIVE 09/30/2017 2106   LEUKOCYTESUR SMALL (A) 09/30/2017 2106   Sepsis Labs: @LABRCNTIP (procalcitonin:4,lacticidven:4)  ) Recent Results (from the past 240 hour(s))  Culture, blood (routine x 2)     Status: None   Collection Time: 09/30/17 11:45 PM  Result Value Ref Range Status   Specimen Description BLOOD LEFT ARM  Final   Special Requests   Final    BOTTLES DRAWN AEROBIC AND ANAEROBIC Blood Culture adequate volume   Culture   Final    NO GROWTH 5 DAYS Performed at [facility], [place] [address], [place], [place] [address]    Report Status 10/06/2017 FINAL  Final  Culture, blood (routine x 2)     Status: None   Collection Time: 10/01/17 12:00 AM  Result Value Ref Range Status   Specimen Description BLOOD LEFT HAND  Final   Special Requests   Final    BOTTLES DRAWN AEROBIC ONLY Blood Culture adequate volume   Culture   Final    NO  GROWTH 5 DAYS Performed at [facility], [place] [address], [place], [place] [address]    Report Status 10/06/2017 FINAL  Final  Surgical pcr screen     Status: None   Collection Time: 10/02/17  5:04 PM  Result Value Ref Range Status   MRSA, PCR NEGATIVE NEGATIVE Final   Staphylococcus aureus NEGATIVE NEGATIVE Final    Comment: (NOTE) The Xpert SA Assay (  FDA approved for NASAL specimens in patients 66 years of age and older), is one component of a comprehensive surveillance program. It is not intended to diagnose infection nor to guide or monitor treatment. Performed at [facility], [place] [address] [place], [place] [address]       Radiology Studies: No results found.   Scheduled Meds: . amLODipine  10 mg Oral Daily  . enoxaparin (LOVENOX) injection  40 mg Subcutaneous Q24H  . famotidine  20 mg Oral Daily  . hydrALAZINE  100 mg Oral TID  . insulin aspart  0-9 Units Subcutaneous TID WC  . labetalol  400 mg Oral TID  . lisinopril  40 mg Oral Daily  . mouth rinse  15 mL Mouth Rinse BID  . methocarbamol  750 mg Oral TID  . polyethylene glycol  17 g Oral Daily  . senna  1 tablet Oral BID   Continuous Infusions: . lactated ringers Stopped (10/06/17 1426)  . piperacillin-tazobactam (ZOSYN)  IV 3.375 g (10/07/17 0551)     LOS: 7 days   Time Spent in minutes   30 minutes  Shemicka Cohrs D.O. on 10/07/2017 at 10:47 AM  Between 7am to 7pm - Pager - [phone]  After 7pm go to [email or web address] - password [email or web address]  And look for the night coverage person covering for me after hours  [facility] Office  [phone]

## 2017-10-07 NOTE — Plan of Care (Signed)
  Problem: Education: Goal: Knowledge of General Education information will improve Description Including pain rating scale, medication(s)/side effects and non-pharmacologic comfort measures Outcome: Progressing   Problem: Health Behavior/Discharge Planning: Goal: Ability to manage health-related needs will improve Outcome: Progressing   Problem: Nutrition: Goal: Adequate nutrition will be maintained Outcome: Progressing   Problem: Elimination: Goal: Will not experience complications related to bowel motility Outcome: Progressing

## 2017-10-07 NOTE — Progress Notes (Signed)
Educated patient on importance of walking and moving post surgery as well as consistent use of incentive spirometer. Patient hesitant to walk because of pain experience while walking yesterday and said use of incentive spirometer was too painful to use at this time. I educated him on importance of using it and he said he would try later on tonight.

## 2017-10-07 NOTE — Progress Notes (Signed)
Patient ID: Jerry Weiss, male   DOB: 05-19-1955, 62 y.o.   MRN: [id number]    4 Days Post-Op  Subjective: Patient not wanting to get up and move.  States everything cramps up.  Some nausea this morning, but still eating some of his solid diet.  No emesis.  No BM yet, + flatus.  Denies urinary or pulmonary symptoms  Objective: Vital signs in last 24 hours: Temp:  [98.4 F (36.9 C)-99.1 F (37.3 C)] 99.1 F (37.3 C) (07/26 0501) Pulse Rate:  [71-73] 73 (07/26 0501) Resp:  [15-18] 18 (07/26 0501) BP: (118-136)/(68-69) 128/68 (07/26 0501) SpO2:  [90 %-92 %] 90 % (07/26 0501) Weight:  [76.2 kg (167 lb 15.9 oz)] 76.2 kg (167 lb 15.9 oz) (07/25 0819) Last BM Date: 09/30/17  Intake/Output from previous day: 07/25 0701 - 07/26 0700 In: 1675.4 [P.O.:1200; I.V.:233.4; IV Piggyback:242] Out: 650 [Urine:625; Drains:25] Intake/Output this shift: No intake/output data recorded.  PE: Heart: regular Lungs: CTAB Abd: soft, tender in RUQ with palpation, +BS, incisions c/d/i, JP with serosang output  Lab Results:  Recent Labs    10/06/17 0400 10/07/17 0454  WBC 16.3* 16.7*  HGB 9.2* 9.4*  HCT 28.8* 30.2*  PLT 317 322   BMET Recent Labs    10/06/17 0400 10/07/17 0454  NA 137 138  K 3.5 3.4*  CL 101 100  CO2 27 27  GLUCOSE 118* 111*  BUN 16 19  CREATININE 1.25* 1.42*  CALCIUM 8.3* 8.3*   PT/INR No results for input(s): LABPROT, INR in the last 72 hours. CMP     Component Value Date/Time   NA 138 10/07/2017 0454   K 3.4 (L) 10/07/2017 0454   CL 100 10/07/2017 0454   CO2 27 10/07/2017 0454   GLUCOSE 111 (H) 10/07/2017 0454   BUN 19 10/07/2017 0454   CREATININE 1.42 (H) 10/07/2017 0454   CALCIUM 8.3 (L) 10/07/2017 0454   PROT 5.9 (L) 10/07/2017 0454   ALBUMIN 2.1 (L) 10/07/2017 0454   AST 22 10/07/2017 0454   ALT 23 10/07/2017 0454   ALKPHOS 166 (H) 10/07/2017 0454   BILITOT 1.1 10/07/2017 0454   GFRNONAA 51 (L) 10/07/2017 0454   GFRAA 60 (L) 10/07/2017 0454    Lipase     Component Value Date/Time   LIPASE 20 10/01/2017 0535       Studies/Results: No results found.  Anti-infectives: Anti-infectives (From admission, onward)   Start     Dose/Rate Route Frequency Ordered Stop   10/05/17 1000  cefTRIAXone (ROCEPHIN) 2 g in sodium chloride 0.9 % 100 mL IVPB  Status:  Discontinued     2 g 200 mL/hr over 30 Minutes Intravenous Every 24 hours 10/05/17 0806 10/05/17 0828   10/05/17 0830  piperacillin-tazobactam (ZOSYN) IVPB 3.375 g     3.375 g 12.5 mL/hr over 240 Minutes Intravenous Every 8 hours 10/05/17 0828     10/03/17 1600  piperacillin-tazobactam (ZOSYN) IVPB 3.375 g     3.375 g 12.5 mL/hr over 240 Minutes Intravenous Every 8 hours 10/03/17 1535 10/04/17 0932   10/01/17 0600  piperacillin-tazobactam (ZOSYN) IVPB 3.375 g  Status:  Discontinued     3.375 g 12.5 mL/hr over 240 Minutes Intravenous Every 8 hours 09/30/17 2332 10/03/17 1525   09/30/17 2200  piperacillin-tazobactam (ZOSYN) IVPB 3.375 g     3.375 g 100 mL/hr over 30 Minutes Intravenous  Once 09/30/17 2152 09/30/17 2240       Assessment/Plan Type B aortic dissection Right renal mass/laparoscopic assisted  right partial nephrectomy 07/28/2017, Dr. Alinda Money MultipleLiver metastasis with biliary obstruction-S/P ERCP x 2- pathology shows poorly differentiated metastatic carcinoma "These morphologic and immunophenotypical findings are consistent with a diagnosis of metastatic poorly differentiated carcinoma. The differential diagnosis includes collecting duct carcinoma and urothelial carcinoma. Tumors arising in the collecting ducts in the urothelium of the calyces can co-express PAX-8, GATA3 and p63. It is not possible to distinguish between these possibilities on this biopsy. The morphology of the metastatic tumor within the liver is reviewed with the tumor within the prior partial kidney resections and the current metastatic liver tumor is morphologically and  immunophenotypically incompatible with the renal cells neoplasms of oncocytosis in the kidney". Type 2 diabetes Hypertension Struct of sleep apnea-CPAP at night Hxof umbilical and ventral hernias S/P TURP 09/08/2017 GERD  Cholecystitis/cholelithiasis POD#4 - Laparoscopic cholecystectomy, suture repair of epigastric hernia, 10/03/2017 Dr. Excell Seltzer -jp looks good. WBC stable. Do not suspect bile leak or anything major at this time secondary to his cholecystectomy.  Unclear why his WBC is still up.  Denies any other infectious symptoms and is AF. -Continue empiric Zosyn, but question of how long. -cont solid diet -increase robaxin to 721m scheduled TID for MSK pain and spasms. -cont to mobilize and pulm toilet. -add miralax with senokot for constipation  FEN:IV fluids/carb mod ID: Zosyn 7/19=>> day5 - discontinue 24 hours post op, restart 7/24 --> DVT: SCD/Lovenox Follow up: TBD   LOS: 7 days    KHenreitta Cea, PAllegheney Clinic Dba Wexford Surgery CenterSurgery 10/07/2017, 7:59 AM Pager: [phone]

## 2017-10-08 LAB — COMPREHENSIVE METABOLIC PANEL
ALK PHOS: 164 U/L — AB (ref 38–126)
ALT: 20 U/L (ref 0–44)
AST: 19 U/L (ref 15–41)
Albumin: 2 g/dL — ABNORMAL LOW (ref 3.5–5.0)
Anion gap: 10 (ref 5–15)
BILIRUBIN TOTAL: 1 mg/dL (ref 0.3–1.2)
BUN: 16 mg/dL (ref 8–23)
CALCIUM: 8.1 mg/dL — AB (ref 8.9–10.3)
CO2: 25 mmol/L (ref 22–32)
Chloride: 102 mmol/L (ref 98–111)
Creatinine, Ser: 1.22 mg/dL (ref 0.61–1.24)
GFR calc Af Amer: >60 mL/min (ref >60)
GFR calc non Af Amer: >60 mL/min (ref >60)
GLUCOSE: 121 mg/dL — AB (ref 70–99)
Potassium: 3.5 mmol/L (ref 3.5–5.1)
Sodium: 137 mmol/L (ref 135–145)
TOTAL PROTEIN: 5.9 g/dL — AB (ref 6.5–8.1)

## 2017-10-08 LAB — CBC
HEMATOCRIT: 29.9 % — AB (ref 39.0–52.0)
Hemoglobin: 9.6 g/dL — ABNORMAL LOW (ref 13.0–17.0)
MCH: 29.9 pg (ref 26.0–34.0)
MCHC: 32.1 g/dL (ref 30.0–36.0)
MCV: 93.1 fL (ref 78.0–100.0)
Platelets: 330 10*3/uL (ref 150–400)
RBC: 3.21 MIL/uL — ABNORMAL LOW (ref 4.22–5.81)
RDW: 14.4 % (ref 11.5–15.5)
WBC: 15.6 10*3/uL — ABNORMAL HIGH (ref 4.0–10.5)

## 2017-10-08 LAB — GLUCOSE, CAPILLARY
Glucose-Capillary: 105 mg/dL — ABNORMAL HIGH (ref 70–99)
Glucose-Capillary: 93 mg/dL (ref 70–99)
Glucose-Capillary: 109 mg/dL — ABNORMAL HIGH (ref 70–99)
Glucose-Capillary: 111 mg/dL — ABNORMAL HIGH (ref 70–99)

## 2017-10-08 MED ORDER — POTASSIUM CHLORIDE CRYS ER 20 MEQ PO TBCR
40.0000 meq | EXTENDED_RELEASE_TABLET | Freq: Once | ORAL | Status: AC
  Administered 2017-10-08: 40 meq via ORAL
  Filled 2017-10-08: qty 2

## 2017-10-08 NOTE — Progress Notes (Signed)
Encouraged patient to use incentive spirometer.  Patient refused states "I can't take a deep breath"  Explained the importance of using IS to prevent pneumonia, patient verbalized understanding.  Encouraged patient to get out of bed to chair.  Patient refused states "It hurts too bad to move."  Explained the importance of mobility, patient verbalized understanding.    MD made aware.

## 2017-10-08 NOTE — Progress Notes (Signed)
PT Cancellation Note  Patient Details Name: Jerry Weiss MRN: [id number] DOB: 1955-08-17   Cancelled Treatment:    Reason Eval/Treat Not Completed: Patient declined, no reason specified Pt declined participating in therapy. Pt reports pain  Is "not bad" while lying supine however is anxious about increased pain with mobility. Pt educated on importance of mobility to his recovery and pt verbalized understanding. Pt encouraged to get OOB with nursing staff today. PT will continue to follow acutely.    Salina April, PTA Pager: [phone]   10/08/2017, 11:02 AM

## 2017-10-08 NOTE — Progress Notes (Signed)
PROGRESS NOTE    Jerry Weiss  TMA:[id number] DOB: 03-18-1955 DOA: 09/30/2017 PCP: [name], MD   Brief Narrative:  HPI On 09/30/2017 by [name] Opyd Jerry Weiss is a 62 y.o. male with medical history significant for hypertension, type 2 diabetes mellitus, type B aortic dissection, RCC status-post partial nephrectomy, and admission earlier this month for obstructive jaundice treated with CBD stent, now presenting to the ED with abdominal pain, nausea, and non-bloody vomiting.  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  He was admitted to the hospital 10 days ago with obstructive jaundice, had biliary stent placed, had a liver mass biopsied by IR with pathology not yet back, and was discharged home for outpatient follow-up.  He had been doing fairly well back at home until developing back pain yesterday and then severe abdominal pain today with nausea and nonbloody vomiting. No fever or diarrhea reported.   Interim history Patient admitted for acute cholecystitis, general surgery on board.  Restarted antibiotics on 7/24 given increase in WBC.   Assessment & Plan   Acute cholecystitis  -General surgery consulted and appreciated, status post laparoscopic cholecystectomy on 10/03/2017 -Continue pain control with oxycodone and Robaxin -WBC increased to 19 on 7/24, restarted zosyn -currently leukocytosis stable, 15.6 (?if leukocytosis is related to liver mets) -able to tolerate solid diet, has not had a bowel movement however passing gas  Biliary obstruction status post stent -Patient with CBD stent placed earlier this month, stent has migrated causing cystic duct occlusion.  -Gastroenterology consulted and appreciated-recommended supportive management with IV fluids and antibiotics.  Did not feel indication for repeat ERCP he did not feel that biliary stent  was an issue at this time given the lack of biliary dilatation and improvement in LFTs.  Liver mass/metastasis -Status post biopsy by interventional radiology on 09/22/2017 -Biopsy was indeterminant although suspicious for malignancy. Results are back from [place]: ""These morphologic and immunophenotypical findings are consistent with a diagnosis of metastatic poorly differentiated carcinoma. The differential diagnosis includes collecting duct carcinoma and urothelial carcinoma. Tumors arising in the collecting ducts in the urothelium of the calyces can co-express PAX-8, GATA3 and p63. It is not possible to distinguish between these possibilities on this biopsy. 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 -Discussed with Dr. [name] who will likely follow up with patient as an outpatient or if he is still admitted by 7/29, he will see him in the hospital  Essential hypertension -Stable, continue amlodipine, hydralazine, labetalol -will hold lisinopril due to AKI  Diabetes mellitus, type II -Hemoglobin A1c 6.8 in June 2019 -Actos, Januvia held -Continue insulin sliding scale and CBG monitoring  Acute kidney injury -Creatinine had bumped to 1.22 -Baseline creatinine 1 -Continue gentle IVF -Continue to monitor BMP  Normocytic anemia -Stable, hemoglobin currently 9.6 -continue to monitor CBC  Transaminitis -Stable, suspect from liver metastasis  Hypokalemia -K 3.5, will give additional dose and monitor BMP  ?Depression -Patient complains of not feeling well, but cannot elaborate. Does not want to get out of bed.  -suspect secondary to malignancy diagnosis   DVT Prophylaxis  Lovenox  Code Status: Full  Family Communication: None at  bedside  Disposition Plan: Admitted. Pending improvment  Consultants General surgery Gastroenterology  Procedures  Laparoscopic cholecystectomy  Antibiotics   Anti-infectives (From admission, onward)   Start     Dose/Rate Route Frequency Ordered Stop   10/05/17 1000  cefTRIAXone (ROCEPHIN) 2 g in sodium chloride 0.9 % 100 mL IVPB  Status:  Discontinued     2 g 200 mL/hr over 30 Minutes Intravenous Every 24 hours 10/05/17 0806 10/05/17 0828   10/05/17 0830  piperacillin-tazobactam (ZOSYN) IVPB 3.375 g     3.375 g 12.5 mL/hr over 240 Minutes Intravenous Every 8 hours 10/05/17 0828     10/03/17 1600  piperacillin-tazobactam (ZOSYN) IVPB 3.375 g     3.375 g 12.5 mL/hr over 240 Minutes Intravenous Every 8 hours 10/03/17 1535 10/04/17 0932   10/01/17 0600  piperacillin-tazobactam (ZOSYN) IVPB 3.375 g  Status:  Discontinued     3.375 g 12.5 mL/hr over 240 Minutes Intravenous Every 8 hours 09/30/17 2332 10/03/17 1525   09/30/17 2200  piperacillin-tazobactam (ZOSYN) IVPB 3.375 g     3.375 g 100 mL/hr over 30 Minutes Intravenous  Once 09/30/17 2152 09/30/17 2240      Subjective:   Jerry Weiss seen and examined today.  Continues to complain of not feeling well but cannot elaborate. Has nausea, but able to still eat. Has not had a bowel movement. Feels weak. Denies chest pain, shortness of breath, dizziness, headache.     Objective:   Vitals:   10/07/17 0501 10/07/17 1344 10/07/17 2041 10/08/17 0517  BP: 128/68 121/69 135/72 (!) 152/74  Pulse: 73 72 73 76  Resp: 18 15 16 16   Temp: 99.1 F (37.3 C) 98.8 F (37.1 C) 98.5 F (36.9 C) 99.1 F (37.3 C)  TempSrc: Oral Oral Oral Oral  SpO2: 90% (!) 88% 91% 91%  Weight:      Height:        Intake/Output Summary (Last 24 hours) at 10/08/2017 1051 Last data filed at 10/08/2017 1041 Gross per 24 hour  Intake 2137.56 ml  Output 1515 ml  Net 622.56 ml   Filed Weights   10/02/17 0537 10/03/17 0436 10/06/17 0819  Weight: 79.6 kg  (175 lb 7.8 oz) 80 kg (176 lb 5.9 oz) 76.2 kg (167 lb 15.9 oz)   Exam  General: Well developed, chronically ill appearing, NAD  HEENT: NCAT, mucous membranes moist.   Neck: Supple, no JVD, no masses  Cardiovascular: S1 S2 auscultated, no rubs, murmurs or gallops. Regular rate and rhythm.  Respiratory: Clear to auscultation bilaterally with equal chest rise  Abdomen: Soft, RUQ TTP, nondistended, + bowel sounds  Extremities: warm dry without cyanosis clubbing or edema  Neuro: AAOx3, nonfocal  Psych: Depressed mood, flat affect  Data Reviewed: I have personally reviewed following labs and imaging studies  CBC: Recent Labs  Lab 10/04/17 0645 10/05/17 0611 10/06/17 0400 10/07/17 0454 10/08/17 0413  WBC 17.4* 19.6* 16.3* 16.7* 15.6*  HGB 8.9* 8.9* 9.2* 9.4* 9.6*  HCT 27.8* 28.3* 28.8* 30.2* 29.9*  MCV 93.0 94.3 93.5 94.7 93.1  PLT 275 310 317 322 768   Basic Metabolic Panel: Recent Labs  Lab 10/04/17 0645 10/05/17 0611 10/06/17 0400 10/07/17 0454 10/08/17 0413  NA 138 139 137 138 137  K 3.4* 3.8 3.5 3.4* 3.5  CL 104 105 101 100 102  CO2 26 26 27 27 25   GLUCOSE 85 124* 118* 111* 121*  BUN 14 16 16 19 16   CREATININE 1.30* 1.36* 1.25* 1.42* 1.22  CALCIUM 8.7* 8.8*  8.3* 8.3* 8.1*  MG  --   --   --  1.8  --    GFR: Estimated Creatinine Clearance: 58.7 mL/min (by C-G formula based on SCr of 1.22 mg/dL). Liver Function Tests: Recent Labs  Lab 10/04/17 0645 10/05/17 0611 10/06/17 0400 10/07/17 0454 10/08/17 0413  AST 44* 32 22 22 19   ALT 33 31 25 23 20   ALKPHOS 193* 184* 181* 166* 164*  BILITOT 1.1 1.1 1.1 1.1 1.0  PROT 5.9* 5.8* 5.9* 5.9* 5.9*  ALBUMIN 2.2* 2.3* 2.2* 2.1* 2.0*   No results for input(s): LIPASE, AMYLASE in the last 168 hours. No results for input(s): AMMONIA in the last 168 hours. Coagulation Profile: No results for input(s): INR, PROTIME in the last 168 hours. Cardiac Enzymes: No results for input(s): CKTOTAL, CKMB, CKMBINDEX,  TROPONINI in the last 168 hours. BNP (last 3 results) No results for input(s): PROBNP in the last 8760 hours. HbA1C: No results for input(s): HGBA1C in the last 72 hours. CBG: Recent Labs  Lab 10/07/17 0817 10/07/17 1212 10/07/17 1726 10/07/17 2035 10/08/17 0750  GLUCAP 97 145* 102* 118* 105*   Lipid Profile: No results for input(s): CHOL, HDL, LDLCALC, TRIG, CHOLHDL, LDLDIRECT in the last 72 hours. Thyroid Function Tests: No results for input(s): TSH, T4TOTAL, FREET4, T3FREE, THYROIDAB in the last 72 hours. Anemia Panel: No results for input(s): VITAMINB12, FOLATE, FERRITIN, TIBC, IRON, RETICCTPCT in the last 72 hours. Urine analysis:    Component Value Date/Time   COLORURINE YELLOW 09/30/2017 2106   APPEARANCEUR CLEAR 09/30/2017 2106   LABSPEC 1.016 09/30/2017 2106   PHURINE 5.0 09/30/2017 2106   GLUCOSEU NEGATIVE 09/30/2017 2106   HGBUR SMALL (A) 09/30/2017 2106   [place] NEGATIVE 09/30/2017 2106   [place] NEGATIVE 09/30/2017 2106   PROTEINUR 30 (A) 09/30/2017 2106   NITRITE NEGATIVE 09/30/2017 2106   LEUKOCYTESUR SMALL (A) 09/30/2017 2106   Sepsis Labs: @LABRCNTIP (procalcitonin:4,lacticidven:4)  ) Recent Results (from the past 240 hour(s))  Culture, blood (routine x 2)     Status: None   Collection Time: 09/30/17 11:45 PM  Result Value Ref Range Status   Specimen Description BLOOD LEFT ARM  Final   Special Requests   Final    BOTTLES DRAWN AEROBIC AND ANAEROBIC Blood Culture adequate volume   Culture   Final    NO GROWTH 5 DAYS Performed at [facility], [place] [address] [place], [place] [address]    Report Status 10/06/2017 FINAL  Final  Culture, blood (routine x 2)     Status: None   Collection Time: 10/01/17 12:00 AM  Result Value Ref Range Status   Specimen Description BLOOD LEFT HAND  Final   Special Requests   Final    BOTTLES DRAWN AEROBIC ONLY Blood Culture adequate volume   Culture   Final    NO GROWTH 5 DAYS Performed at [facility], [place] [address], [place], [place] [address]    Report Status 10/06/2017 FINAL  Final  Surgical pcr screen     Status: None   Collection Time: 10/02/17  5:04 PM  Result Value Ref Range Status   MRSA, PCR NEGATIVE NEGATIVE Final   Staphylococcus aureus NEGATIVE NEGATIVE Final    Comment: (NOTE) The Xpert SA Assay (FDA approved for NASAL specimens in patients 18 years of age and older), is one component of a comprehensive surveillance program. It is not intended to diagnose infection nor to guide or monitor treatment. Performed at [facility],  [address], [place], [place] [address]       Radiology Studies: No results found.   Scheduled Meds: . amLODipine  10 mg Oral Daily  . enoxaparin (LOVENOX) injection  40 mg Subcutaneous Q24H  . famotidine  20 mg Oral Daily  . hydrALAZINE  100 mg Oral TID  . insulin aspart  0-9 Units Subcutaneous TID WC  . labetalol  400 mg Oral TID  . mouth rinse  15 mL Mouth Rinse BID  . methocarbamol  750 mg Oral TID  . polyethylene glycol  17 g Oral Daily  . senna  1 tablet Oral BID   Continuous Infusions: . sodium chloride 75 mL/hr at 10/08/17 1044  . lactated ringers 10 mL/hr at 10/07/17 1002  . piperacillin-tazobactam (ZOSYN)  IV 3.375 g (10/08/17 0606)     LOS: 8 days   Time Spent in minutes   30 minutes  Caia Lofaro D.O. on 10/08/2017 at 10:51 AM  Between 7am to 7pm - Pager - [phone]  After 7pm go to [email or web address] - password [email or web address]  And look for the night coverage person covering for me after hours  [facility] Office  [phone]

## 2017-10-08 NOTE — Progress Notes (Signed)
5 Days Post-Op    CC: Ongoing intractable pain.  Subjective: Patient is 5 days post cholecystectomy.  He is complaining of pain around his port sites.  More on the right side than the left.  He is not eating, pain is pretty much constant, he does not want to move.  He will not even turn over on his side to let me listen to his lungs this morning.  He is not getting out of bed.  His drain is serosanguineous drainage with minimal output.  Sites all look fine.  Objective: Vital signs in last 24 hours: Temp:  [98.5 F (36.9 C)-99.1 F (37.3 C)] 99.1 F (37.3 C) (07/27 0517) Pulse Rate:  [72-76] 76 (07/27 0517) Resp:  [15-16] 16 (07/27 0517) BP: (121-152)/(69-74) 152/74 (07/27 0517) SpO2:  [88 %-91 %] 91 % (07/27 0517) Last BM Date: 09/30/17 1660 PO 518 IV No BM recorded Urine 1355 Afebrile, VSS CMP is stable WBC still 15.6 His last CT was 09/30/2017  Intake/Output from previous day: 07/26 0701 - 07/27 0700 In: 2377.6 [P.O.:1660; I.V.:518.6; IV Piggyback:199] Out: 3299 [Urine:1355] Intake/Output this shift: No intake/output data recorded.  General appearance: alert, cooperative, mild distress and He is just having chronic pain.  So much he does not want to turn over. Resp: clear to auscultation bilaterally and Anterior exam. GI: The abdomen is flat and soft, but he is especially tender on the right side.  This is kind of variable, but remains extremely sensitive.  So much so that he does not want to move.  Lab Results:  Recent Labs    10/07/17 0454 10/08/17 0413  WBC 16.7* 15.6*  HGB 9.4* 9.6*  HCT 30.2* 29.9*  PLT 322 330    BMET Recent Labs    10/07/17 0454 10/08/17 0413  NA 138 137  K 3.4* 3.5  CL 100 102  CO2 27 25  GLUCOSE 111* 121*  BUN 19 16  CREATININE 1.42* 1.22  CALCIUM 8.3* 8.1*   PT/INR No results for input(s): LABPROT, INR in the last 72 hours.  Recent Labs  Lab 10/04/17 0645 10/05/17 0611 10/06/17 0400 10/07/17 0454 10/08/17 0413  AST  44* 32 22 22 19   ALT 33 31 25 23 20   ALKPHOS 193* 184* 181* 166* 164*  BILITOT 1.1 1.1 1.1 1.1 1.0  PROT 5.9* 5.8* 5.9* 5.9* 5.9*  ALBUMIN 2.2* 2.3* 2.2* 2.1* 2.0*     Lipase     Component Value Date/Time   LIPASE 20 10/01/2017 0535   Prior to Admission medications   Medication Sig Start Date End Date Taking? Authorizing Provider  amLODipine (NORVASC) 10 MG tablet Take 10 mg by mouth daily.   Yes [provider]  gabapentin (NEURONTIN) 300 MG capsule Take 300 mg by mouth at bedtime.   Yes [provider]  hydrALAZINE (APRESOLINE) 100 MG tablet Take 100 mg by mouth 3 (three) times daily.   Yes [provider]  labetalol (NORMODYNE) 200 MG tablet Take 400 mg by mouth 3 (three) times daily.    Yes [provider]  lisinopril (PRINIVIL,ZESTRIL) 20 MG tablet Take 40 mg by mouth daily.    Yes [provider]  neomycin-polymyxin-dexameth (MAXITROL) 0.1 % OINT Place 1 application into both eyes daily as needed (eye irritation).   Yes [provider]  ondansetron (ZOFRAN) 4 MG tablet Take 4 mg by mouth 2 (two) times daily as needed for nausea/vomiting. 09/16/17  Yes [provider]  pantoprazole (PROTONIX) 20 MG tablet Take  20 mg by mouth daily.  07/11/16  Yes [provider]  pioglitazone (ACTOS) 15 MG tablet Take 15 mg by mouth daily.   Yes [provider]  polyethylene glycol (MIRALAX / GLYCOLAX) packet Take 17 g by mouth daily as needed for mild constipation or moderate constipation. 09/26/17  Yes Samuella Cota, MD  sitaGLIPtin (JANUVIA) 100 MG tablet Take 100 mg by mouth daily.   Yes [provider]      Medications: . amLODipine  10 mg Oral Daily  . enoxaparin (LOVENOX) injection  40 mg Subcutaneous Q24H  . famotidine  20 mg Oral Daily  . hydrALAZINE  100 mg Oral TID  . insulin aspart  0-9 Units Subcutaneous TID WC  . labetalol  400 mg Oral TID  . mouth rinse  15 mL Mouth Rinse BID  .  methocarbamol  750 mg Oral TID  . polyethylene glycol  17 g Oral Daily  . senna  1 tablet Oral BID   . sodium chloride Stopped (10/07/17 2203)  . lactated ringers 10 mL/hr at 10/07/17 1002  . piperacillin-tazobactam (ZOSYN)  IV 3.375 g (10/08/17 0606)   Anti-infectives (From admission, onward)   Start     Dose/Rate Route Frequency Ordered Stop   10/05/17 1000  cefTRIAXone (ROCEPHIN) 2 g in sodium chloride 0.9 % 100 mL IVPB  Status:  Discontinued     2 g 200 mL/hr over 30 Minutes Intravenous Every 24 hours 10/05/17 0806 10/05/17 0828   10/05/17 0830  piperacillin-tazobactam (ZOSYN) IVPB 3.375 g     3.375 g 12.5 mL/hr over 240 Minutes Intravenous Every 8 hours 10/05/17 0828     10/03/17 1600  piperacillin-tazobactam (ZOSYN) IVPB 3.375 g     3.375 g 12.5 mL/hr over 240 Minutes Intravenous Every 8 hours 10/03/17 1535 10/04/17 0932   10/01/17 0600  piperacillin-tazobactam (ZOSYN) IVPB 3.375 g  Status:  Discontinued     3.375 g 12.5 mL/hr over 240 Minutes Intravenous Every 8 hours 09/30/17 2332 10/03/17 1525   09/30/17 2200  piperacillin-tazobactam (ZOSYN) IVPB 3.375 g     3.375 g 100 mL/hr over 30 Minutes Intravenous  Once 09/30/17 2152 09/30/17 2240      Assessment/Plan Type B aortic dissection Right renal mass/laparoscopic assisted right partial nephrectomy 07/28/2017, Dr. Alinda Money MultipleLiver metastasis with biliary obstruction - S/P ERCP x 2- pathology shows poorly differentiated metastatic carcinoma   - Oncology has been contacted to evaluate  Type 2 diabetes Hypertension Struct of sleep apnea-CPAP at night  Hxof umbilical and ventral hernias S/P TURP 09/08/2017 GERD CKD  - with normalization of creatinine to 1.22 today  Cholecystitis/cholelithiasis POD#5 -Laparoscopic cholecystectomy, suture repair of epigastric hernia, 10/03/2017 Dr. Excell Seltzer -jp looks good. WBC stable.Do not suspect bile leak or anything major at this time secondary to his  cholecystectomy.  Unclear why his WBC is still up.  Denies any other infectious symptoms and is AF. -Continue empiric Zosyn, but question of how long. -cont solid diet -increase robaxin to 774m scheduled TID for MSK pain and spasms. -cont to mobilize and pulm toilet. -add miralax with senokot for constipation  FIHK:VQQVmod ID: [id number] 7/19=>> day5 - discontinue 24 hours post op,        restart 7/24 =>> day 4 DVT: SCD/Lovenox Follow up: TBD   Plan: We do not think this pain is coming from his surgery per se.  He should be sore and tender but not to the point where he does not want to move.  His last CT was 09/30/17.  His creatinine has just returned to normal.  Will discuss repeat CT scan.  He says oncology does not plan to see him until Monday.        LOS: 8 days    Antoino Westhoff 10/08/2017 [phone]

## 2017-10-09 DIAGNOSIS — C787 Secondary malignant neoplasm of liver and intrahepatic bile duct: Secondary | ICD-10-CM

## 2017-10-09 LAB — CBC
HCT: 29 % — ABNORMAL LOW (ref 39.0–52.0)
Hemoglobin: 9 g/dL — ABNORMAL LOW (ref 13.0–17.0)
MCH: 29.3 pg (ref 26.0–34.0)
MCHC: 31 g/dL (ref 30.0–36.0)
MCV: 94.5 fL (ref 78.0–100.0)
Platelets: 346 10*3/uL (ref 150–400)
RBC: 3.07 MIL/uL — ABNORMAL LOW (ref 4.22–5.81)
RDW: 14.6 % (ref 11.5–15.5)
WBC: 13.9 10*3/uL — ABNORMAL HIGH (ref 4.0–10.5)

## 2017-10-09 LAB — BASIC METABOLIC PANEL
Anion gap: 9 (ref 5–15)
BUN: 14 mg/dL (ref 8–23)
CHLORIDE: 104 mmol/L (ref 98–111)
CO2: 25 mmol/L (ref 22–32)
CREATININE: 1.12 mg/dL (ref 0.61–1.24)
Calcium: 8.2 mg/dL — ABNORMAL LOW (ref 8.9–10.3)
GFR calc Af Amer: >60 mL/min (ref >60)
GFR calc non Af Amer: >60 mL/min (ref >60)
Glucose, Bld: 93 mg/dL (ref 70–99)
POTASSIUM: 3.7 mmol/L (ref 3.5–5.1)
SODIUM: 138 mmol/L (ref 135–145)

## 2017-10-09 LAB — GLUCOSE, CAPILLARY
GLUCOSE-CAPILLARY: 118 mg/dL — AB (ref 70–99)
GLUCOSE-CAPILLARY: 104 mg/dL — AB (ref 70–99)
Glucose-Capillary: 84 mg/dL (ref 70–99)
Glucose-Capillary: 120 mg/dL — ABNORMAL HIGH (ref 70–99)

## 2017-10-09 MED ORDER — METHOCARBAMOL 750 MG PO TABS
750.0000 mg | ORAL_TABLET | Freq: Four times a day (QID) | ORAL | Status: DC
  Administered 2017-10-09 (×3): 750 mg via ORAL
  Filled 2017-10-09 (×3): qty 1

## 2017-10-09 MED ORDER — HYDROMORPHONE HCL 1 MG/ML IJ SOLN
0.5000 mg | Freq: Once | INTRAMUSCULAR | Status: AC
  Administered 2017-10-09: 0.5 mg via INTRAVENOUS
  Filled 2017-10-09: qty 1

## 2017-10-09 NOTE — Progress Notes (Signed)
PROGRESS NOTE    Jerry Weiss  DUK:[id number] DOB: 01/13/1956 DOA: 09/30/2017 PCP: [name], MD   Brief Narrative:  HPI On 09/30/2017 by [name] Opyd Jerry Weiss is a 62 y.o. male with medical history significant for hypertension, type 2 diabetes mellitus, type B aortic dissection, RCC status-post partial nephrectomy, and admission earlier this month for obstructive jaundice treated with CBD stent, now presenting to the ED with abdominal pain, nausea, and non-bloody vomiting.  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  He was admitted to the hospital 10 days ago with obstructive jaundice, had biliary stent placed, had a liver mass biopsied by IR with pathology not yet back, and was discharged home for outpatient follow-up.  He had been doing fairly well back at home until developing back pain yesterday and then severe abdominal pain today with nausea and nonbloody vomiting. No fever or diarrhea reported.   Interim history Patient admitted for acute cholecystitis, general surgery on board.  Restarted antibiotics on 7/24 given increase in WBC.   Assessment & Plan   Acute cholecystitis  -General surgery consulted and appreciated, status post laparoscopic cholecystectomy on 10/03/2017 -Continue pain control with oxycodone and Robaxin (will change to q6h) -WBC increased to 19 on 7/24, restarted zosyn -currently leukocytosis stable, 13.9 (?if leukocytosis is related to liver mets) -able to tolerate solid diet, has not had a bowel movement however passing gas  Biliary obstruction status post stent -Patient with CBD stent placed earlier this month, stent has migrated causing cystic duct occlusion.  -Gastroenterology consulted and appreciated-recommended supportive management with IV fluids and antibiotics.  Did not feel indication for repeat ERCP he did not  feel that biliary stent was an issue at this time given the lack of biliary dilatation and improvement in LFTs.  Liver mass/metastasis -Status post biopsy by interventional radiology on 09/22/2017 -Biopsy was indeterminant although suspicious for malignancy. Results are back from [place]: ""These morphologic and immunophenotypical findings are consistent with a diagnosis of metastatic poorly differentiated carcinoma. The differential diagnosis includes collecting duct carcinoma and urothelial carcinoma. Tumors arising in the collecting ducts in the urothelium of the calyces can co-express PAX-8, GATA3 and p63. It is not possible to distinguish between these possibilities on this biopsy. 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 -Discussed with Dr. [name] who will likely follow up with patient as an outpatient or if he is still admitted by 7/29, he will see him in the hospital  Essential hypertension -Stable, continue amlodipine, hydralazine, labetalol -lisinopril held due to AKI  Diabetes mellitus, type II -Hemoglobin A1c 6.8 in June 2019 -Actos, Januvia held -Continue insulin sliding scale and CBG monitoring  Acute kidney injury -Creatinine had bumped to 1.12 -Baseline creatinine 1 -Continue gentle IVF -Continue to monitor BMP  Normocytic anemia -Stable, hemoglobin currently 9.0 -continue to monitor CBC  Transaminitis -Stable, suspect from liver metastasis  Hypokalemia -K 3.7, will give additional dose (goal of 4) and monitor BMP  ?Depression -Patient complains of not feeling well, but cannot elaborate. Does not want to get out of bed.  -has not worked with PT -suspect secondary to malignancy diagnosis  -discussed psychiatry consult with  patient, declined -encouraged patient to get out of bed  DVT Prophylaxis  Lovenox  Code Status: Full  Family Communication: None at bedside  Disposition Plan: Admitted. Pending improvment  Consultants General surgery Gastroenterology  Procedures  Laparoscopic cholecystectomy  Antibiotics   Anti-infectives (From admission, onward)   Start     Dose/Rate Route Frequency Ordered Stop   10/05/17 1000  cefTRIAXone (ROCEPHIN) 2 g in sodium chloride 0.9 % 100 mL IVPB  Status:  Discontinued     2 g 200 mL/hr over 30 Minutes Intravenous Every 24 hours 10/05/17 0806 10/05/17 0828   10/05/17 0830  piperacillin-tazobactam (ZOSYN) IVPB 3.375 g     3.375 g 12.5 mL/hr over 240 Minutes Intravenous Every 8 hours 10/05/17 0828     10/03/17 1600  piperacillin-tazobactam (ZOSYN) IVPB 3.375 g     3.375 g 12.5 mL/hr over 240 Minutes Intravenous Every 8 hours 10/03/17 1535 10/04/17 0932   10/01/17 0600  piperacillin-tazobactam (ZOSYN) IVPB 3.375 g  Status:  Discontinued     3.375 g 12.5 mL/hr over 240 Minutes Intravenous Every 8 hours 09/30/17 2332 10/03/17 1525   09/30/17 2200  piperacillin-tazobactam (ZOSYN) IVPB 3.375 g     3.375 g 100 mL/hr over 30 Minutes Intravenous  Once 09/30/17 2152 09/30/17 2240      Subjective:   Jerry Weiss seen and examined today.  Continues to complain of not feeling well but cannot elaborate. Continues to have nausea, but able to eat. Denies chest pain, shortness of breath, dizziness, headache. Feels weak. States that when he moves his body tenses up and he cannot even speak due to the pain.    Objective:   Vitals:   10/08/17 1307 10/08/17 2121 10/09/17 0442 10/09/17 0812  BP: 137/72 132/71 131/71 (!) 153/66  Pulse: 68 70 70 70  Resp: 16   18  Temp: 98.6 F (37 C) 98.8 F (37.1 C) 98.6 F (37 C) 98 F (36.7 C)  TempSrc: Oral Oral Oral Oral  SpO2: 90% 90% 91% 90%  Weight:      Height:        Intake/Output Summary (Last 24 hours) at 10/09/2017  0957 Last data filed at 10/09/2017 0630 Gross per 24 hour  Intake 1306.16 ml  Output 1235 ml  Net 71.16 ml   Filed Weights   10/02/17 0537 10/03/17 0436 10/06/17 0819  Weight: 79.6 kg (175 lb 7.8 oz) 80 kg (176 lb 5.9 oz) 76.2 kg (167 lb 15.9 oz)   Exam  General: Well developed, chronically ill appearing, NAD  HEENT: NCAT, mucous membranes moist.   Neck: Supple  Cardiovascular: S1 S2 auscultated, no murmur, RRR  Respiratory: Clear to auscultation bilaterally with equal chest rise  Abdomen: Soft, RUQ TTP, nondistended, + bowel sounds  Extremities: warm dry without cyanosis clubbing or edema  Neuro: AAOx3, nonfocal  Psych: Depressed mood and flat affect   Data Reviewed: I have personally reviewed following labs and imaging studies  CBC: Recent Labs  Lab 10/05/17 0611 10/06/17 0400 10/07/17 0454 10/08/17 0413 10/09/17 0603  WBC 19.6* 16.3* 16.7* 15.6* 13.9*  HGB 8.9* 9.2* 9.4* 9.6* 9.0*  HCT 28.3* 28.8* 30.2* 29.9* 29.0*  MCV 94.3 93.5 94.7 93.1 94.5  PLT 310 317 322 330 160   Basic Metabolic Panel: Recent Labs  Lab 10/05/17 0611 10/06/17 0400 10/07/17 0454 10/08/17 0413 10/09/17 0603  NA 139 137 138 137 138  K 3.8 3.5 3.4* 3.5 3.7  CL 105 101 100 102 104  CO2 26 27  27 25 25   GLUCOSE 124* 118* 111* 121* 93  BUN 16 16 19 16 14   CREATININE 1.36* 1.25* 1.42* 1.22 1.12  CALCIUM 8.8* 8.3* 8.3* 8.1* 8.2*  MG  --   --  1.8  --   --    GFR: Estimated Creatinine Clearance: 63.9 mL/min (by C-G formula based on SCr of 1.12 mg/dL). Liver Function Tests: Recent Labs  Lab 10/04/17 0645 10/05/17 0611 10/06/17 0400 10/07/17 0454 10/08/17 0413  AST 44* 32 22 22 19   ALT 33 31 25 23 20   ALKPHOS 193* 184* 181* 166* 164*  BILITOT 1.1 1.1 1.1 1.1 1.0  PROT 5.9* 5.8* 5.9* 5.9* 5.9*  ALBUMIN 2.2* 2.3* 2.2* 2.1* 2.0*   No results for input(s): LIPASE, AMYLASE in the last 168 hours. No results for input(s): AMMONIA in the last 168 hours. Coagulation Profile: No  results for input(s): INR, PROTIME in the last 168 hours. Cardiac Enzymes: No results for input(s): CKTOTAL, CKMB, CKMBINDEX, TROPONINI in the last 168 hours. BNP (last 3 results) No results for input(s): PROBNP in the last 8760 hours. HbA1C: No results for input(s): HGBA1C in the last 72 hours. CBG: Recent Labs  Lab 10/08/17 0750 10/08/17 1158 10/08/17 1704 10/08/17 2118 10/09/17 0815  GLUCAP 105* 93 109* 111* 84   Lipid Profile: No results for input(s): CHOL, HDL, LDLCALC, TRIG, CHOLHDL, LDLDIRECT in the last 72 hours. Thyroid Function Tests: No results for input(s): TSH, T4TOTAL, FREET4, T3FREE, THYROIDAB in the last 72 hours. Anemia Panel: No results for input(s): VITAMINB12, FOLATE, FERRITIN, TIBC, IRON, RETICCTPCT in the last 72 hours. Urine analysis:    Component Value Date/Time   COLORURINE YELLOW 09/30/2017 2106   APPEARANCEUR CLEAR 09/30/2017 2106   LABSPEC 1.016 09/30/2017 2106   PHURINE 5.0 09/30/2017 2106   GLUCOSEU NEGATIVE 09/30/2017 2106   HGBUR SMALL (A) 09/30/2017 2106   [place] NEGATIVE 09/30/2017 2106   [place] NEGATIVE 09/30/2017 2106   PROTEINUR 30 (A) 09/30/2017 2106   NITRITE NEGATIVE 09/30/2017 2106   LEUKOCYTESUR SMALL (A) 09/30/2017 2106   Sepsis Labs: @LABRCNTIP (procalcitonin:4,lacticidven:4)  ) Recent Results (from the past 240 hour(s))  Culture, blood (routine x 2)     Status: None   Collection Time: 09/30/17 11:45 PM  Result Value Ref Range Status   Specimen Description BLOOD LEFT ARM  Final   Special Requests   Final    BOTTLES DRAWN AEROBIC AND ANAEROBIC Blood Culture adequate volume   Culture   Final    NO GROWTH 5 DAYS Performed at [facility], [place] [address], [place], [place] [address]    Report Status 10/06/2017 FINAL  Final  Culture, blood (routine x 2)     Status: None   Collection Time: 10/01/17 12:00 AM  Result Value Ref Range Status   Specimen Description BLOOD LEFT HAND  Final   Special Requests   Final      BOTTLES DRAWN AEROBIC ONLY Blood Culture adequate volume   Culture   Final    NO GROWTH 5 DAYS Performed at [facility], [place] [address] [place], [place] [address]    Report Status 10/06/2017 FINAL  Final  Surgical pcr screen     Status: None   Collection Time: 10/02/17  5:04 PM  Result Value Ref Range Status   MRSA, PCR NEGATIVE NEGATIVE Final   Staphylococcus aureus NEGATIVE NEGATIVE Final    Comment: (NOTE) The Xpert SA Assay (FDA approved for NASAL specimens in patients 56 years of age  and older), is one component of a comprehensive surveillance program. It is not intended to diagnose infection nor to guide or monitor treatment. Performed at [facility], [place] [address] [place], [place] [address]       Radiology Studies: No results found.   Scheduled Meds: . amLODipine  10 mg Oral Daily  . enoxaparin (LOVENOX) injection  40 mg Subcutaneous Q24H  . famotidine  20 mg Oral Daily  . hydrALAZINE  100 mg Oral TID  . insulin aspart  0-9 Units Subcutaneous TID WC  . labetalol  400 mg Oral TID  . mouth rinse  15 mL Mouth Rinse BID  . methocarbamol  750 mg Oral TID  . polyethylene glycol  17 g Oral Daily  . senna  1 tablet Oral BID   Continuous Infusions: . sodium chloride 75 mL/hr at 10/09/17 0300  . lactated ringers 10 mL/hr at 10/07/17 1002  . piperacillin-tazobactam (ZOSYN)  IV 3.375 g (10/09/17 0510)     LOS: 9 days   Time Spent in minutes   30 minutes  Deneshia Zucker D.O. on 10/09/2017 at 9:57 AM  Between 7am to 7pm - Pager - [phone]  After 7pm go to [email or web address] - password [email or web address]  And look for the night coverage person covering for me after hours  [facility] Office  [phone]

## 2017-10-09 NOTE — Progress Notes (Signed)
Pt got up to the chair with a pivot. Pt grimacing with pain to the right lower back upon movement. Pain med given. Encouraged pt to ambulate some more.

## 2017-10-09 NOTE — Progress Notes (Signed)
Patient ID: Jerry Weiss, male   DOB: [birth year], 62 y.o.   MRN: [id number] [facility] Progress Note:   6 Days Post-Op  Subjective: Mental status is clear;   Objective: Vital signs in last 24 hours: Temp:  [98 F (36.7 C)-98.8 F (37.1 C)] 98 F (36.7 C) (07/28 0812) Pulse Rate:  [68-70] 70 (07/28 0812) Resp:  [16-18] 18 (07/28 0812) BP: (131-153)/(66-72) 153/66 (07/28 0812) SpO2:  [90 %-91 %] 90 % (07/28 0812)  Intake/Output from previous day: 07/27 0701 - 07/28 0700 In: 1306.2 [I.V.:1156.2; IV Piggyback:150] Out: 61 [Urine:730; Drains:40] Intake/Output this shift: Total I/O In: -  Out: 465 [Urine:450; Drains:15]  Physical Exam: Work of breathing is OK.  Right upper quadrant pain probably related to liver mets  Lab Results:  Results for orders placed or performed during the hospital encounter of 09/30/17 (from the past 48 hour(s))  Glucose, capillary     Status: Abnormal   Collection Time: 10/07/17 12:12 PM  Result Value Ref Range   Glucose-Capillary 145 (H) 70 - 99 mg/dL  Glucose, capillary     Status: Abnormal   Collection Time: 10/07/17  5:26 PM  Result Value Ref Range   Glucose-Capillary 102 (H) 70 - 99 mg/dL  Glucose, capillary     Status: Abnormal   Collection Time: 10/07/17  8:35 PM  Result Value Ref Range   Glucose-Capillary 118 (H) 70 - 99 mg/dL  CBC     Status: Abnormal   Collection Time: 10/08/17  4:13 AM  Result Value Ref Range   WBC 15.6 (H) 4.0 - 10.5 K/uL   RBC 3.21 (L) 4.22 - 5.81 MIL/uL   Hemoglobin 9.6 (L) 13.0 - 17.0 g/dL   HCT 29.9 (L) 39.0 - 52.0 %   MCV 93.1 78.0 - 100.0 fL   MCH 29.9 26.0 - 34.0 pg   MCHC 32.1 30.0 - 36.0 g/dL   RDW 14.4 11.5 - 15.5 %   Platelets 330 150 - 400 K/uL    Comment: Performed at [facility], [place] [address] [place], [place] [address]  Comprehensive metabolic panel     Status: Abnormal   Collection Time: 10/08/17  4:13 AM  Result Value Ref Range   Sodium 137 135 - 145 mmol/L   Potassium 3.5  3.5 - 5.1 mmol/L   Chloride 102 98 - 111 mmol/L   CO2 25 22 - 32 mmol/L   Glucose, Bld 121 (H) 70 - 99 mg/dL   BUN 16 8 - 23 mg/dL   Creatinine, Ser 1.22 0.61 - 1.24 mg/dL   Calcium 8.1 (L) 8.9 - 10.3 mg/dL   Total Protein 5.9 (L) 6.5 - 8.1 g/dL   Albumin 2.0 (L) 3.5 - 5.0 g/dL   AST 19 15 - 41 U/L   ALT 20 0 - 44 U/L   Alkaline Phosphatase 164 (H) 38 - 126 U/L   Total Bilirubin 1.0 0.3 - 1.2 mg/dL   GFR calc non Af Amer >60 >60 mL/min   GFR calc Af Amer >60 >60 mL/min    Comment: (NOTE) The eGFR has been calculated using the CKD EPI equation. This calculation has not been validated in all clinical situations. eGFR's persistently <60 mL/min signify possible Chronic Kidney Disease.    Anion gap 10 5 - 15    Comment: Performed at [facility] [address] [place], [place] [address]  Glucose, capillary     Status: Abnormal   Collection Time: 10/08/17  7:50 AM  Result Value Ref Range   Glucose-Capillary 105 (H) 70 - 99 mg/dL  Glucose, capillary     Status: None   Collection Time: 10/08/17 11:58 AM  Result Value Ref Range   Glucose-Capillary 93 70 - 99 mg/dL  Glucose, capillary     Status: Abnormal   Collection Time: 10/08/17  5:04 PM  Result Value Ref Range   Glucose-Capillary 109 (H) 70 - 99 mg/dL  Glucose, capillary     Status: Abnormal   Collection Time: 10/08/17  9:18 PM  Result Value Ref Range   Glucose-Capillary 111 (H) 70 - 99 mg/dL  Basic metabolic panel     Status: Abnormal   Collection Time: 10/09/17  6:03 AM  Result Value Ref Range   Sodium 138 135 - 145 mmol/L   Potassium 3.7 3.5 - 5.1 mmol/L   Chloride 104 98 - 111 mmol/L   CO2 25 22 - 32 mmol/L   Glucose, Bld 93 70 - 99 mg/dL   BUN 14 8 - 23 mg/dL   Creatinine, Ser 1.12 0.61 - 1.24 mg/dL   Calcium 8.2 (L) 8.9 - 10.3 mg/dL   GFR calc non Af Amer >60 >60 mL/min   GFR calc Af Amer >60 >60 mL/min    Comment: (NOTE) The eGFR has been calculated using the CKD EPI equation. This calculation has not  been validated in all clinical situations. eGFR's persistently <60 mL/min signify possible Chronic Kidney Disease.    Anion gap 9 5 - 15    Comment: Performed at [facility] [address], [place], [place] [address]  CBC     Status: Abnormal   Collection Time: 10/09/17  6:03 AM  Result Value Ref Range   WBC 13.9 (H) 4.0 - 10.5 K/uL   RBC 3.07 (L) 4.22 - 5.81 MIL/uL   Hemoglobin 9.0 (L) 13.0 - 17.0 g/dL   HCT 29.0 (L) 39.0 - 52.0 %   MCV 94.5 78.0 - 100.0 fL   MCH 29.3 26.0 - 34.0 pg   MCHC 31.0 30.0 - 36.0 g/dL   RDW 14.6 11.5 - 15.5 %   Platelets 346 150 - 400 K/uL    Comment: Performed at [facility], [place] [address], [place], [place] [address]  Glucose, capillary     Status: None   Collection Time: 10/09/17  8:15 AM  Result Value Ref Range   Glucose-Capillary 84 70 - 99 mg/dL    Radiology/Results: No results found.  Anti-infectives: Anti-infectives (From admission, onward)   Start     Dose/Rate Route Frequency Ordered Stop   10/05/17 1000  cefTRIAXone (ROCEPHIN) 2 g in sodium chloride 0.9 % 100 mL IVPB  Status:  Discontinued     2 g 200 mL/hr over 30 Minutes Intravenous Every 24 hours 10/05/17 0806 10/05/17 0828   10/05/17 0830  piperacillin-tazobactam (ZOSYN) IVPB 3.375 g     3.375 g 12.5 mL/hr over 240 Minutes Intravenous Every 8 hours 10/05/17 0828     10/03/17 1600  piperacillin-tazobactam (ZOSYN) IVPB 3.375 g     3.375 g 12.5 mL/hr over 240 Minutes Intravenous Every 8 hours 10/03/17 1535 10/04/17 0932   10/01/17 0600  piperacillin-tazobactam (ZOSYN) IVPB 3.375 g  Status:  Discontinued     3.375 g 12.5 mL/hr over 240 Minutes Intravenous Every 8 hours 09/30/17 2332 10/03/17 1525   09/30/17 2200  piperacillin-tazobactam (ZOSYN) IVPB 3.375 g     3.375 g 100 mL/hr over 30 Minutes Intravenous  Once 09/30/17 2152  09/30/17 2240      Assessment/Plan: Problem List: Patient Active Problem List   Diagnosis Date Noted  . Acute calculous cholecystitis s/p  lap cholecystectomy 10/03/2017 09/30/2017  . Acute cholangitis 09/30/2017  . Abdominal pain, generalized 09/25/2017  . Obstructive jaundice 09/20/2017  . Hypodense mass of liver 09/20/2017  . DM2 (diabetes mellitus, type 2) ([place]) 09/20/2017  . HTN (hypertension) 09/20/2017  . Urinary retention due to benign prostatic hyperplasia 09/08/2017  . Renal cancer, right ([place]) 07/28/2017  . Aortic dissection ([place]) 03/06/2017  . OSA (obstructive sleep apnea) 07/15/2010    Dr. [name] has seen and will advise on chemtherapy.  Post lap chole 6 Days Post-Op    LOS: 9 days   Matt B. Hassell Done, MD, [facility] [phone] beeper [phone]  10/09/2017 12:02 PM

## 2017-10-09 NOTE — Consult Note (Signed)
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  He was discovered to have a right renal mass and thrombosis of the left kidney.  During that hospital admission.  The aortic dissection was treated medically. He was referred to Dr. Alinda Money.  An MRI of the abdomen on 06/24/2017 revealed a solid mass in the right kidney measuring 3.6 x 3.4 cm.  Lateral to the dominant mass and 8 mm subcapsular lesion was noted suspicious for a second solid mass.  A third diffusely enhancing mass was noted in the posterior lower pole of the right kidney measuring 2.0 x 1.2 cm.  He underwent a robotic laparoscopic partial nephrectomy 07/28/2017.  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  He went a TUR on 09/08/2017 for management of BPH with urinary retention.  Jerry Weiss reports developing light-colored stool mild abdominal pain, and nausea.  He was admitted on 09/20/2017.  A CT of the abdomen and pelvis, compared to 05/25/2017 revealed new hypodense lesions in the liver, new mild gallbladder distention, new common bile duct dilatation with central intrahepatic biliary dilatation.  The pancreas appeared unremarkable.  Gastroenterology was consulted.  He underwent an ERCP on 09/21/2017.  A distal common duct stricture was noted.  A common duct stent was placed.  The stent migrated proximally and could not be removed.  He underwent a second ERCP procedure on 09/23/2017 for placement of a bridging stent. The cytology from the ERCP 09/21/2017 was nondiagnostic.  He was taken to an ultrasound-guided biopsy of a liver lesion on 09/22/2017.  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  Jerry Weiss was readmitted 09/30/2017 with severe abdomen/back pain and nausea/vomiting.   An abdominal ultrasound 09/30/2017 confirmed cholelithiasis, gallbladder sludge, diffuse gallbladder wall thickening and a positive Murphy sign.  Multiple liver metastases were noted. He was diagnosed with acute cholecystitis and was taken to the operating room on 10/03/2017 for a cholecystectomy.  Liver metastases were noted.  The gallbladder appeared distended with severe inflammation.  The pathology from the gallbladder is pending.  Jerry Weiss continues to have nausea.  He has pain in the right upper abdomen and right lateral abdomen/posterior chest.      Past Medical History:  Diagnosis Date  . Anemia    mild  . 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 Diabetes mellitus without complication ([place])    type2  . GERD (gastroesophageal reflux disease)   . Hypertension   . Inguinal hernia    Bilateral age 72  . Leg muscle spasm    both legs occ  . OSA (obstructive sleep apnea)    Wears CPAP at night  . Pneumonia    history of x3  . Renal cancer, right (HCC)-chromophobe carcinomas, status post partial nephrectomy  07/28/2017  . 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  Obstructive jaundice July 2019, status placed common bile duct stent placement  09/21/2017 and 09/23/2017  . Status post dilation of esophageal narrowing   . Umbilical hernia    Small noted on CT 05/26/2017  . Ventral hernia    noted on CT 05/26/2017  :  Past Surgical History:  Procedure Laterality Date  . BILIARY STENT PLACEMENT  09/21/2017   Procedure: BILIARY STENT PLACEMENT;  Surgeon: Clarene Essex, MD;  Location: [facility] ENDOSCOPY;  Service: Endoscopy;;  . BILIARY STENT PLACEMENT  09/23/2017   Procedure: BILIARY STENT PLACEMENT;  Surgeon: Clarene Essex, MD;  Location: [place];  Service: Endoscopy;;  . BRONCHIAL BRUSHINGS  09/21/2017   Procedure: BILIARY BRUSHINGS;  Surgeon: Clarene Essex, MD;  Location: [place];  Service: Endoscopy;;  . CHOLECYSTECTOMY N/A 10/03/2017   Procedure: LAPAROSCOPIC CHOLECYSTECTOMY;  Surgeon: Excell Seltzer, MD;  Location: [place];  Service: General;  Laterality: N/A;  . COLONOSCOPY    . ERCP N/A 09/21/2017   Procedure: ENDOSCOPIC RETROGRADE CHOLANGIOPANCREATOGRAPHY (ERCP);  Surgeon: Clarene Essex, MD;  Location: [place];  Service: Endoscopy;  Laterality: N/A;  . ERCP N/A 09/23/2017   Procedure: ENDOSCOPIC RETROGRADE CHOLANGIOPANCREATOGRAPHY (ERCP);  Surgeon: Clarene Essex, MD;  Location: [place];  Service: Endoscopy;  Laterality: N/A;  . HERNIA REPAIR Bilateral    AGE 20  . ROBOT ASSISTED LAPAROSCOPIC NEPHRECTOMY Right 07/28/2017   Procedure: XI ROBOTIC ASSISTED LAPAROSCOPIC PARTIAL NEPHRECTOMY;  Surgeon: Raynelle Bring, MD;  Location: [facility];  Service: Urology;  Laterality: Right;  . SPHINCTEROTOMY  09/21/2017   Procedure: SPHINCTEROTOMY;  Surgeon: Clarene Essex, MD;  Location: [facility] ENDOSCOPY;  Service: Endoscopy;;  . Bess Kinds CHOLANGIOSCOPY N/A 09/23/2017   Procedure: TAVWPVXY CHOLANGIOSCOPY;  Surgeon: Clarene Essex, MD;  Location: [place];  Service: Endoscopy;  Laterality: N/A;  . TONSILLECTOMY     age 76  . TRANSURETHRAL RESECTION OF PROSTATE    . TRANSURETHRAL RESECTION OF PROSTATE N/A 09/08/2017   Procedure: TRANSURETHRAL RESECTION OF THE PROSTATE (TURP);  Surgeon: Irine Seal, MD;  Location: [facility];  Service: Urology;  Laterality: N/A;  . UPPER GASTROINTESTINAL ENDOSCOPY    :   Current Facility-Administered Medications:  .  0.9 %  sodium chloride infusion, , Intravenous, Continuous, Cristal Ford, DO, Last Rate: 75 mL/hr at 10/09/17 0300 .  acetaminophen (TYLENOL) tablet 650 mg, 650 mg, Oral, Q6H PRN, 650 mg at 10/06/17 1252 **OR** acetaminophen (TYLENOL) suppository 650 mg, 650 mg, Rectal, Q6H PRN, Earnstine Regal, PA-C .  amLODipine (NORVASC) tablet 10 mg, 10 mg, Oral, Daily, Earnstine Regal, PA-C, 10 mg at 10/09/17 8016 .  diphenhydrAMINE  (BENADRYL) capsule 25 mg, 25 mg, Oral, Q6H PRN **OR** diphenhydrAMINE (BENADRYL) injection 25 mg, 25 mg, Intravenous, Q6H PRN, Earnstine Regal, PA-C, 25 mg at 10/08/17 0157 .  enoxaparin (LOVENOX) injection 40 mg, 40 mg, Subcutaneous, Q24H, Leodis Sias, RPH, 40 mg at 10/08/17 1441 .  famotidine (PEPCID) tablet 20 mg, 20 mg, Oral, Daily, Saverio Danker, PA-C, 20 mg at 10/09/17 5537 .  hydrALAZINE (APRESOLINE) tablet 100 mg, 100 mg, Oral, TID, Earnstine Regal, PA-C, 100 mg at 10/09/17 4827 .  insulin aspart (novoLOG) injection 0-9 Units, 0-9 Units, Subcutaneous, TID WC, Schorr, Rhetta Mura, NP, 1 Units at 10/07/17 1230 .  labetalol (NORMODYNE) tablet 400 mg, 400 mg, Oral, TID, Earnstine Regal, PA-C, 400 mg at 10/09/17 0786 .  labetalol (NORMODYNE,TRANDATE) injection 5 mg, 5 mg, Intravenous, Q6H PRN, Earnstine Regal, PA-C .  lactated ringers infusion, , Intravenous, Continuous, Earnstine Regal, PA-C, Last Rate: 10 mL/hr at 10/07/17 1002 .  MEDLINE mouth rinse, 15 mL, Mouth Rinse, BID, Earnstine Regal, PA-C, 15 mL at 10/08/17 2134 .  methocarbamol (ROBAXIN) tablet 750 mg, 750 mg, Oral, QID, Mikhail, Maryann, DO .  ondansetron [facility]) tablet 4 mg, 4 mg, Oral, Q6H PRN, 4 mg at 10/05/17 1615 **OR** ondansetron (ZOFRAN) injection 4 mg, 4 mg, Intravenous, Q6H PRN,  Earnstine Regal, PA-C, 4 mg at 10/09/17 1047 .  oxyCODONE (Oxy IR/ROXICODONE) immediate release tablet 5 mg, 5 mg, Oral, Q4H PRN, Earnstine Regal, PA-C, 5 mg at 10/09/17 7672 .  piperacillin-tazobactam (ZOSYN) IVPB 3.375 g, 3.375 g, Intravenous, Q8H, Saverio Danker, PA-C, Last Rate: 12.5 mL/hr at 10/09/17 0510, 3.375 g at 10/09/17 0510 .  polyethylene glycol (MIRALAX / GLYCOLAX) packet 17 g, 17 g, Oral, Daily, Saverio Danker, PA-C, 17 g at 10/09/17 0947 .  senna (SENOKOT) tablet 8.6 mg, 1 tablet, Oral, BID, Fanny Skates, MD, 8.6 mg at 10/09/17 0962:  . amLODipine  10 mg Oral Daily  . enoxaparin (LOVENOX) injection  40 mg  Subcutaneous Q24H  . famotidine  20 mg Oral Daily  . hydrALAZINE  100 mg Oral TID  . insulin aspart  0-9 Units Subcutaneous TID WC  . labetalol  400 mg Oral TID  . mouth rinse  15 mL Mouth Rinse BID  . methocarbamol  750 mg Oral QID  . polyethylene glycol  17 g Oral Daily  . senna  1 tablet Oral BID  :  Allergies  Allergen Reactions  . Ciprofloxacin Other (See Comments)    Unknown  . Codeine Other (See Comments)    Hallucinations   . Shellfish-Derived Products Swelling    "Eyes swell shut"  :  FH: No family history of cancer other than skin cancer  SOCIAL HISTORY: He lives alone in [place].  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  He does not use cigarettes or alcohol.  No transfusion history.  No risk factor for HIV or hepatitis.  Review of Systems:  Positives include: Anorexia, 45 pound weight loss since December 2018, abdominal pain, nausea  A complete ROS was otherwise negative.   Physical Exam:  Blood pressure (!) 153/66, pulse 70, temperature 98 F (36.7 C), temperature source Oral, resp. rate 18, height 5' 7"  (1.702 m), weight 167 lb 15.9 oz (76.2 kg), SpO2 90 %.  HEENT: Oral cavity without visible mass, neck without mass Lungs: Distant breath sounds, clear bilaterally Cardiac: Regular rate and rhythm Abdomen: No hepatosplenomegaly, no mass, nondistended, tender in the right upper abdomen, healing surgical incision sites, right upper quadrant drain site without evidence of infection GU: Testes without mass Vascular: No leg edema Lymph nodes: No cervical, supraclavicular, axillary, or inguinal nodes Neurologic: Alert and oriented, the motor exam appears intact in the upper and lower extremities Skin: No rash Musculoskeletal: No spine tenderness  LABS:  Recent Labs    10/08/17 0413 10/09/17 0603  WBC 15.6* 13.9*  HGB 9.6* 9.0*  HCT 29.9* 29.0*  PLT 330 346    Recent Labs    10/08/17 0413 10/09/17 0603  NA 137 138  K 3.5 3.7  CL 102 104   CO2 25 25  GLUCOSE 121* 93  BUN 16 14  CREATININE 1.22 1.12  CALCIUM 8.1* 8.2*      RADIOLOGY:  Ct Abdomen Pelvis Wo Contrast  Result Date: 09/20/2017 CLINICAL DATA:  Fatigue with nausea and vomiting. Recent TURP 2 weeks ago. History of renal cancer status post right partial nephrectomy. EXAM: CT ABDOMEN AND PELVIS WITHOUT CONTRAST TECHNIQUE: Multidetector CT imaging of the abdomen and pelvis was performed following the standard protocol without IV contrast. COMPARISON:  CT chest, abdomen, and pelvis dated May 25, 2017. FINDINGS: Lower chest: Linear scarring in the right lower lobe. Small pericardial effusion. Hepatobiliary: There are three questionable new hypodense lesions within the peripheral liver, measuring up to 2.9 cm.  Unchanged cholelithiasis with new mild gallbladder distention. No gallbladder wall thickening. New common bile duct dilatation, measuring 1.4 cm in diameter, and mild central intrahepatic biliary dilatation. Pancreas: Unremarkable. No pancreatic ductal dilatation or surrounding inflammatory changes. Spleen: Normal in size without focal abnormality. Adrenals/Urinary Tract: The right adrenal gland is unremarkable. Unchanged 1.9 cm left adrenal adenoma. Unchanged infarcted appearance of the left kidney. Postsurgical changes related to interval right partial nephrectomy without definite focal lesion, although evaluation is limited without intravenous contrast. No renal or ureteral calculi. No hydronephrosis. The bladder is decompressed. Stomach/Bowel: Stomach is within normal limits. Appendix appears normal. No evidence of bowel wall thickening, distention, or inflammatory changes. Vascular/Lymphatic: Aortic atherosclerosis. No enlarged abdominal or pelvic lymph nodes. Reproductive: Mild prostatomegaly, unchanged. Other: No free fluid or pneumoperitoneum. Unchanged fat containing supraumbilical ventral hernia and umbilical hernia. Musculoskeletal: No acute or significant osseous  findings. IMPRESSION: 1. Unchanged cholelithiasis with new mild gallbladder distention and intra- and extrahepatic biliary dilatation. No definite obstructing stone or lesion. Recommend correlation with LFTs and either ERCP or MRCP for further evaluation. 2. Three questionable new hypodensities within the peripheral liver measuring up to 2.9 cm, incompletely evaluated without intravenous contrast. Recommend right upper quadrant ultrasound to confirm these findings prior to potential further evaluation with MRI. 3. Postsurgical changes related to interval right partial nephrectomy. Evaluation for residual or recurrent tumor is limited without intravenous contrast. 4. Unchanged left renal infarction. 5. Small pericardial effusion. 6.  Aortic atherosclerosis (ICD10-I70.0). Electronically Signed   By: Titus Dubin M.D.   On: 09/20/2017 16:33   Dg Chest 2 View  Result Date: 09/30/2017 CLINICAL DATA:  Epigastric pain.  Known thoracic aneurysm. EXAM: CHEST - 2 VIEW COMPARISON:  CT chest 05/25/2017. FINDINGS: Normal heart size. Clear lung fields. No acute bony abnormality. No effusion or pneumothorax. Thoracic aortic enlargement, consistent with known dissection. IMPRESSION: No active cardiopulmonary disease. No apparent interval aortic enlargement or LEFT effusion. Electronically Signed   By: Staci Righter M.D.   On: 09/30/2017 17:59   US Biopsy (liver)  Result Date: 09/22/2017 INDICATION: Concern for renal cell carcinoma, now with multiple liver lesions worrisome for metastatic disease. Please perform ultrasound-guided liver lesion biopsy for tissue diagnostic purposes. EXAM: ULTRASOUND GUIDED LIVER LESION BIOPSY COMPARISON:  CT of the abdomen and pelvis - 09/20/2017 MEDICATIONS: None ANESTHESIA/SEDATION: Fentanyl 100 mcg IV; Versed 1.5 mg IV Total Moderate Sedation time:  13 Minutes. The patient's level of consciousness and vital signs were monitored continuously by radiology nursing throughout the procedure  under my direct supervision. COMPLICATIONS: None immediate. PROCEDURE: Informed written consent was obtained from the patient after a discussion of the risks, benefits and alternatives to treatment. The patient understands and consents the procedure. A timeout was performed prior to the initiation of the procedure. Ultrasound scanning was performed of the right upper abdominal quadrant demonstrates multiple hypoechoic nodules scattered throughout the right lobe of the liver. An approximately 1.7 x 1.8 cm hypoechoic nodule within the right lobe of the liver was targeted for biopsy given lesion location and sonographic window. The procedure was planned. The right upper abdominal quadrant was prepped and draped in the usual sterile fashion. The overlying soft tissues were anesthetized with 1% lidocaine with epinephrine. A 17 gauge, 6.8 cm co-axial needle was advanced into a peripheral aspect of the lesion. This was followed by 4 core biopsies with an 18 gauge core device under direct ultrasound guidance. The coaxial needle tract was embolized with a small amount of Gel-Foam slurry and superficial  hemostasis was obtained with manual compression. Post procedural scanning was negative for definitive area of hemorrhage or additional complication. A dressing was placed. The patient tolerated the procedure well without immediate post procedural complication. IMPRESSION: Technically successful ultrasound guided core needle biopsy of indeterminate lesion within the right lobe of the liver. Electronically Signed   By: Sandi Mariscal M.D.   On: 09/22/2017 17:21   Dg Ercp Biliary & Pancreatic Ducts  Result Date: 09/23/2017 CLINICAL DATA:  62 year old male for ERCP EXAM: ERCP TECHNIQUE: Multiple spot images obtained with the fluoroscopic device and submitted for interpretation post-procedure. FLUOROSCOPY TIME:  Fluoroscopy Time:  9 minutes COMPARISON:  09/21/2017 FINDINGS: Limited images during ERCP. Images demonstrate endoscope  projecting over the upper abdomen, with overlapping metallic stents. IMPRESSION: Limited images during ERCP demonstrates overlapping metallic stents. Please refer to the dictated operative report for full details of intraoperative findings and procedure. Electronically Signed   By: Corrie Mckusick D.O.   On: 09/23/2017 12:27   Dg Ercp Biliary & Pancreatic Ducts  Result Date: 09/21/2017 CLINICAL DATA:  Common bile duct stone. Attempted biliary stent placement. EXAM: ERCP TECHNIQUE: Multiple spot images obtained with the fluoroscopic device and submitted for interpretation post-procedure. FLUOROSCOPY TIME:  18 minutes, 17 seconds COMPARISON:  CT abdomen and pelvis - 09/21/2017; MRCP - 06/24/2017 FINDINGS: Two spot intraoperative fluoroscopic images of the right upper abdominal quadrant during ERCP are provided for review Initial image demonstrates an ERCP probe overlying the right upper abdominal quadrant with selective cannulation opacification of the common bile duct which appears mild to moderately dilated. There is insufflation of a balloon within distal aspect of the CBD. Subsequent image demonstrates presumed sweeping of the CBD and presumed biliary sphincterotomy. IMPRESSION: ERCP as above. These images were submitted for radiologic interpretation only. Please see the procedural report for the amount of contrast and the fluoroscopy time utilized. Electronically Signed   By: Sandi Mariscal M.D.   On: 09/21/2017 12:59   Ct Angio Chest/abd/pel For Dissection W And/or Wo Contrast  Result Date: 09/30/2017 CLINICAL DATA:  62 year old male with back pain and abdominal pain. Nausea vomiting. Concern for aortic dissection. Known type B dissection. Recent ultrasound-guided biopsy of right lobe of the liver. EXAM: CT ANGIOGRAPHY CHEST, ABDOMEN AND PELVIS TECHNIQUE: Multidetector CT imaging through the chest, abdomen and pelvis was performed using the standard protocol during bolus administration of intravenous contrast.  Multiplanar reconstructed images and MIPs were obtained and reviewed to evaluate the vascular anatomy. CONTRAST:  179m ISOVUE-370 IOPAMIDOL (ISOVUE-370) INJECTION 76% COMPARISON:  CT of the abdomen pelvis dated 09/20/2017 and 05/25/2017 FINDINGS: CTA CHEST FINDINGS Cardiovascular: There is no cardiomegaly. Trace pericardial effusion similar to prior CT. Dissection flap origin ating in the aortic arch distal to the origin of the left subclavian artery consistent with known type B dissection and similar to prior CT. No aneurysmal dilatation. No change in the caliber of the aorta since the prior CT. No periaortic stranding or extravasation of contrast. The origins of the great vessels of the aortic arch are patent. There is no CT evidence of pulmonary embolism. Mediastinum/Nodes: No hilar or mediastinal adenopathy. The esophagus is grossly unremarkable. No mediastinal fluid collection or hematoma. Mild heterogeneity of the left lobe of the thyroid gland with probable small nodules. Ultrasound may provide better evaluation. Lungs/Pleura: There is a trace left pleural effusion. Minimal bibasilar dependent atelectasis as well as linear atelectasis/scarring at the right lung base. No focal consolidation, or pneumothorax. The central airways are patent. Musculoskeletal: No chest  wall abnormality. No acute or significant osseous findings. Degenerative changes of the spine. Review of the MIP images confirms the above findings. CTA ABDOMEN AND PELVIS FINDINGS VASCULAR Aorta: Dissection flap extends along the entire length of the abdominal aorta. No aneurysmal dilatation. No change in the caliber of the aorta since the prior CT. No periaortic stranding or extravasation of contrast. Celiac: There is extension of the dissection flap into the celiac artery similar to prior CT. No change in the caliber of the celiac artery. The celiac artery and its branches remain patent. There is a replaced left hepatic artery from the left  gastric artery. SMA: The SMA is patent and arises from the true lumen. There may be extension of the dissection flap to the origin of the SMA. Renals: The right renal artery is patent and arises from the true lumen. The left renal artery arises from the false lumen. The left renal artery is occluded similar to prior CT. IMA: The IMA is patent. The dissection flap extends to the origin of the IMA similar to prior CT. Inflow: Dissection extends to the proximal left common iliac artery similar to prior CT and terminates there. The iliac arteries are patent bilaterally. Veins: No obvious venous abnormality within the limitations of this arterial phase study. Review of the MIP images confirms the above findings. NON-VASCULAR There is no intra-abdominal free air.  Trace subhepatic free fluid. Hepatobiliary: Multiple hepatic hypodense lesions suspicious for metastatic disease. These lesions measure up to 2.7 cm in segment IV B. there has been interval placement of a biliary stent with associated pneumobilia. Evaluation for stent patency is limited on the CT. Some soft tissue density noted within the mid to distal lumen of the stent. The gallbladder is distended and contains multiple calcified stones. High attenuating content within the gallbladder likely vicarious excretion of contrast from recent procedure. There is mild diffuse gallbladder wall thickening and stranding which may be reactive. Clinical correlation is recommended. Pancreas: Ill-defined soft tissue density in the uncinate process of the pancreas is not well characterized. There is atrophy of the body and tail of the pancreas. No ductal dilatation. Spleen: Unremarkable. Adrenals/Urinary Tract: There is a 14 mm left adrenal nodule, indeterminate. The right adrenal gland is unremarkable. Infarcted and atrophic left kidney. Postsurgical changes related to partial right nephrectomy. There is no hydronephrosis on the right. The visualized ureters and urinary  bladder appear unremarkable. Stomach/Bowel: Moderate stool throughout the colon. No bowel dilatation or evidence of obstruction. Lymphatic: Multiple peripancreatic, portacaval enlarged lymph nodes. Multiple mildly rounded retroperitoneal and para-aortic lymph nodes. Reproductive: Mildly enlarged prostate gland measures approximately 5 cm in transverse diameter. Other: Anterior lower abdominal fat containing hernia similar prior CT. No inflammatory changes or fluid collection. Musculoskeletal: No acute or significant osseous findings. Review of the MIP images confirms the above findings. IMPRESSION: 1. Similar appearance of known type B dissection as the prior CT of 05/25/2017. No periaortic stranding, extraluminal contrast, or change in the aortic aortic caliber. Extension of the dissection flap into the celiac axis, origins of the SMA and IMA and occlusion of the left renal artery as seen previously. 2. Interval placement of a biliary stent. Apparent soft tissue density in the mid to distal lumen of the stent noted however, there is pneumobilia suggestive of stent patency. 3. Distended gallbladder with multiple stones and vicariously excreted contrast from prior imaging studies. There is mild thickened appearance of the gallbladder wall which may be reactive. Acute cholecystitis is not excluded. Clinical correlation  is recommended. 4. Multiple hepatic metastatic disease. Recent biopsy of liver lesion. No large hematoma. 5. Ill-defined soft tissue in the uncinate process of the pancreas with atrophy of the distal gland. 6. Infarcted and atrophic left kidney and postsurgical changes of partial right nephrectomy. 7. Retroperitoneal, and periportal adenopathy. Electronically Signed   By: Anner Crete M.D.   On: 09/30/2017 21:37   US Abdomen Limited Ruq  Result Date: 09/30/2017 CLINICAL DATA:  Right upper quadrant abdominal pain. Status post right nephrectomy for renal cancer on 07/28/2017. Nausea, vomiting and  obstructive jaundice. EXAM: ULTRASOUND ABDOMEN LIMITED RIGHT UPPER QUADRANT COMPARISON:  Abdomen and pelvis CT dated 09/20/2017 and abdomen MR dated 06/24/2017. FINDINGS: Gallbladder: Diffuse gallbladder wall thickening with a maximum thickness of 4.5 mm. Sludge in the gallbladder. There is also a 1.2 cm shadowing stone in the gallbladder. There is a 2nd similar sized stone in the gallbladder on the recent CT. Minimal pericholecystic fluid. There was a positive sonographic Murphy sign. Common bile duct: Diameter: 3.8 mm Liver: Multiple masses of varying size and echogenicity. The largest measures 3.6 cm in maximum diameter. Normal liver echotexture. There is also evidence of pneumobilia in the left lobe of the liver. Portal vein is patent on color Doppler imaging with normal direction of blood flow towards the liver. IMPRESSION: 1. Cholelithiasis, sludge in the gallbladder, diffuse gallbladder wall thickening, minimal pericholecystic fluid and positive sonographic Murphy sign. This combination of findings is compatible with a diagnosis of acute cholecystitis. 2. Left lobe liver pneumobilia. 3. Multiple liver metastases. Electronically Signed   By: Claudie Revering M.D.   On: 09/30/2017 19:20   US Abdomen Limited Ruq  Result Date: 09/21/2017 CLINICAL DATA:  Hypodense liver masses on CT.  Obstructive jaundice. EXAM: ULTRASOUND ABDOMEN LIMITED RIGHT UPPER QUADRANT COMPARISON:  Abdominopelvic CT yesterday, CT 05/25/2017. FINDINGS: Gallbladder: Gallbladder is distended containing intraluminal stones and sludge. No gallbladder wall thickening, normal gallbladder wall thickness of 2 mm. No pericholecystic fluid. No sonographic Murphy sign noted by sonographer. Common bile duct: Diameter: 14 mm at the porta hepatis. No visualized choledocholithiasis. Liver: Three peripheral hypoechoic liver lesions measuring 3.4 cm, 2.8 cm, and 1.8 cm in the left and right hepatic lobe. These correspond to findings on CT. Background  parenchymal echogenicity is normal. Portal vein is patent on color Doppler imaging with normal direction of blood flow towards the liver. IMPRESSION: 1. Three peripheral hypoechoic liver lesions, largest measuring 3.4 cm. These have a nonspecific sonographic appearance, MRI would provide further characterization. Given these were not seen on prior exam, metastatic disease is considered if there is history of primary malignancy. 2. Distended gallbladder with stones and sludge, no gallbladder wall thickening. Common bile duct dilatation without visualized choledocholithiasis. Electronically Signed   By: Jeb Levering M.D.   On: 09/21/2017 07:04    Assessment and Plan:   1.  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 09/22/2017  CT abdomen/pelvis 09/20/2017- new hypodense liver lesions, gallbladder distention with intra-and extrahepatic biliary dilatation  ERCP 09/21/2017 and 09/23/2017, distal common duct stricture, status post placement of ridging, duct stents, cytology from biliary brushing-nondiagnostic  Ultrasound-guided biopsy of a right liver lesion 09/22/2017, metastatic poorly differentiated carcinoma  CT angiogram chest, abdomen, and pelvis 09/30/2017- ill-defined soft tissue in the uncinate process, stable type B aortic dissection, distended gallbladder, multiple hepatic metastases, retroperitoneal and periportal adenopathy, infarcted/atrophic left kidney  2.  Acute cholecystitis, status post a cholecystectomy 10/03/2017 3.  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  BPH with urinary retention, status post TUR 09/08/2017 6.  OSA 7.  GERD 8.  Diabetes 9.  Anemia 10. Renal insufficiency-status post infarction of left kidney, partial right nephrectomy  Jerry Weiss has been diagnosed with metastatic poorly differentiated carcinoma  involving a liver biopsy.  The primary tumor site has not been confirmed.  It is unlikely the metastatic carcinoma is related to the renal cell tumors resected in May.  There is evidence of a pancreas uncinate mass, abdominal adenopathy, and he presented with biliary obstruction.  Pancreas cancer is the leading differential diagnosis.  I will review the abdomen CT and MRI.  We may consider a EUS to confirm a pancreas mass. The differential diagnosis includes other primary tumor sites such as another GI or GU primary.  Jerry Weiss understands the metastatic nature of the tumor.  No therapy will be curative.  We will consider palliative chemotherapy options when a more definitive diagnosis is in hand.  I will request tumor from the liver biopsy be submitted for molecular testing.  Recommendations: 1.  Submit the liver biopsy material for Foundation 1 testing 2.  Check CEA and CA 19-9 3.  Consider EUS to establish presence of a pancreas mass 4.  Continue postoperative care per surgery 5.  I will continue following Jerry Weiss in the hospital and outpatient follow up will be scheduled   Betsy Coder, MD 10/09/2017, 10:55 AM

## 2017-10-10 LAB — COMPREHENSIVE METABOLIC PANEL
ALBUMIN: 2 g/dL — AB (ref 3.5–5.0)
ALK PHOS: 197 U/L — AB (ref 38–126)
ALT: 20 U/L (ref 0–44)
AST: 28 U/L (ref 15–41)
Anion gap: 8 (ref 5–15)
BILIRUBIN TOTAL: 0.9 mg/dL (ref 0.3–1.2)
BUN: 11 mg/dL (ref 8–23)
CO2: 25 mmol/L (ref 22–32)
CREATININE: 1.07 mg/dL (ref 0.61–1.24)
Calcium: 8.3 mg/dL — ABNORMAL LOW (ref 8.9–10.3)
Chloride: 104 mmol/L (ref 98–111)
GFR calc Af Amer: >60 mL/min (ref >60)
GFR calc non Af Amer: >60 mL/min (ref >60)
GLUCOSE: 112 mg/dL — AB (ref 70–99)
POTASSIUM: 3.4 mmol/L — AB (ref 3.5–5.1)
Sodium: 137 mmol/L (ref 135–145)
Total Protein: 6.2 g/dL — ABNORMAL LOW (ref 6.5–8.1)

## 2017-10-10 LAB — CEA: CEA: 5.3 ng/mL — ABNORMAL HIGH (ref 0.0–4.7)

## 2017-10-10 LAB — GLUCOSE, CAPILLARY
GLUCOSE-CAPILLARY: 111 mg/dL — AB (ref 70–99)
Glucose-Capillary: 102 mg/dL — ABNORMAL HIGH (ref 70–99)
Glucose-Capillary: 108 mg/dL — ABNORMAL HIGH (ref 70–99)
Glucose-Capillary: 112 mg/dL — ABNORMAL HIGH (ref 70–99)

## 2017-10-10 LAB — CBC
HEMATOCRIT: 31.1 % — AB (ref 39.0–52.0)
HEMOGLOBIN: 9.9 g/dL — AB (ref 13.0–17.0)
MCH: 29.2 pg (ref 26.0–34.0)
MCHC: 31.8 g/dL (ref 30.0–36.0)
MCV: 91.7 fL (ref 78.0–100.0)
Platelets: 364 10*3/uL (ref 150–400)
RBC: 3.39 MIL/uL — ABNORMAL LOW (ref 4.22–5.81)
RDW: 14.4 % (ref 11.5–15.5)
WBC: 12.8 10*3/uL — AB (ref 4.0–10.5)

## 2017-10-10 LAB — PREALBUMIN: PREALBUMIN: 8.6 mg/dL — AB (ref 18–38)

## 2017-10-10 LAB — CANCER ANTIGEN 19-9: CAN 19-9: 3 U/mL (ref 0–35)

## 2017-10-10 LAB — MAGNESIUM: MAGNESIUM: 1.9 mg/dL (ref 1.7–2.4)

## 2017-10-10 MED ORDER — ENSURE ENLIVE PO LIQD
237.0000 mL | Freq: Three times a day (TID) | ORAL | Status: DC
  Administered 2017-10-10 – 2017-10-13 (×10): 237 mL via ORAL

## 2017-10-10 MED ORDER — HYDROMORPHONE HCL 2 MG PO TABS
2.0000 mg | ORAL_TABLET | ORAL | Status: DC | PRN
  Administered 2017-10-10 – 2017-10-14 (×15): 2 mg via ORAL
  Administered 2017-10-14: 4 mg via ORAL
  Administered 2017-10-14: 2 mg via ORAL
  Filled 2017-10-10 (×5): qty 1
  Filled 2017-10-10: qty 2
  Filled 2017-10-10 (×11): qty 1

## 2017-10-10 MED ORDER — PROMETHAZINE HCL 25 MG/ML IJ SOLN
12.5000 mg | Freq: Four times a day (QID) | INTRAMUSCULAR | Status: DC | PRN
  Administered 2017-10-10 – 2017-10-12 (×4): 12.5 mg via INTRAVENOUS
  Filled 2017-10-10 (×4): qty 1

## 2017-10-10 MED ORDER — HALOPERIDOL LACTATE 5 MG/ML IJ SOLN: 2.0000 mg | Freq: Once | INTRAMUSCULAR | Status: DC

## 2017-10-10 MED ORDER — POTASSIUM CHLORIDE CRYS ER 20 MEQ PO TBCR
40.0000 meq | EXTENDED_RELEASE_TABLET | Freq: Once | ORAL | Status: AC
Start: 2017-10-10 — End: 2017-10-10
  Administered 2017-10-10: 40 meq via ORAL
  Filled 2017-10-10: qty 2

## 2017-10-10 MED ORDER — METHOCARBAMOL 500 MG PO TABS
1000.0000 mg | ORAL_TABLET | Freq: Four times a day (QID) | ORAL | Status: DC
  Administered 2017-10-10 – 2017-10-14 (×16): 1000 mg via ORAL
  Filled 2017-10-10 (×17): qty 2

## 2017-10-10 MED ORDER — OXYCODONE HCL 5 MG PO TABS
5.0000 mg | ORAL_TABLET | ORAL | Status: DC | PRN
  Administered 2017-10-10: 10 mg via ORAL
  Filled 2017-10-10: qty 2

## 2017-10-10 NOTE — Progress Notes (Addendum)
Pt refuses to ambulate with mobility tech.

## 2017-10-10 NOTE — Progress Notes (Addendum)
IP PROGRESS NOTE  Subjective:   [name] continues to have pain in the right upper abdomen.  The pain is worse with sitting upright.  He reports relief of the pain with IV Dilaudid, but not with oxycodone.  Objective: Vital signs in last 24 hours: Blood pressure (!) 142/73, pulse 68, temperature 98.6 F (37 C), temperature source Oral, resp. rate 16, height 5' 7"  (1.702 m), weight 172 lb (78 kg), SpO2 94 %.  Intake/Output from previous day: 07/28 0701 - 07/29 0700 In: 2439.4 [P.O.:277; I.V.:1948.8; IV Piggyback:213.5] Out: 2295 [Urine:2230; Drains:65]  Physical Exam:   Abdomen: Soft, no hepatomegaly, right upper quadrant drain, nontender Skin: No rash at the right upper abdomen or right flank    Lab Results: Recent Labs    10/09/17 0603 10/10/17 0552  WBC 13.9* 12.8*  HGB 9.0* 9.9*  HCT 29.0* 31.1*  PLT 346 364    BMET Recent Labs    10/09/17 0603 10/10/17 0552  NA 138 137  K 3.7 3.4*  CL 104 104  CO2 25 25  GLUCOSE 93 112*  BUN 14 11  CREATININE 1.12 1.07  CALCIUM 8.2* 8.3*    No results found for: CEA1  Studies/Results: No results found.  Medications: I have reviewed the patient's current medications.  Assessment/Plan:  1.  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 09/22/2017  CT abdomen/pelvis 09/20/2017- new hypodense liver lesions, gallbladder distention with intra-and extrahepatic biliary dilatation  ERCP 09/21/2017 and 09/23/2017, distal common duct stricture, status post placement of ridging, duct stents, cytology from biliary brushing-nondiagnostic  Ultrasound-guided biopsy of a right liver lesion 09/22/2017, metastatic poorly differentiated carcinoma  CT angiogram chest, abdomen, and pelvis 09/30/2017- ill-defined soft tissue in the uncinate process, stable type B aortic dissection, distended gallbladder, multiple hepatic metastases, retroperitoneal and periportal adenopathy, infarcted/atrophic left  kidney  2.  Acute cholecystitis, status post a cholecystectomy 10/03/2017 3.  Aortic dissection with infarction of the left kidney December 2018 4.  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  BPH with urinary retention, status post TUR 09/08/2017 6.  OSA 7.  GERD 8.  Diabetes 9.  Anemia 10. Renal insufficiency-status post infarction of left kidney, partial right nephrectomy 11.  Pain-likely secondary to cholecystitis in the cholecystectomy versus metastatic carcinoma   [name] appears unchanged today.  He continues to have pain in the right upper abdomen and flank areas.  The pain may be related to the cholecystitis and cholecystectomy, though it is possible his pain is related to metastatic carcinoma.  He will try oral Dilaudid for pain.  [name] has been diagnosed with metastatic carcinoma without an obvious primary tumor site.  I reviewed the CT and MRI images and radiology today.  A definite pancreas mass cannot be appreciated.  There are multiple liver metastases.  I will discuss the case with pathology to see if there is enough tumor on the liver biopsy for molecular testing.  We will follow-up on the CEA and CA 19-9 results.  I recommend an endoscopic ultrasound to rule out a pancreas mass.  Recommendations: 1.  Consult gastroenterology to consider an endoscopic ultrasound to look for evidence of a pancreas mass 2.  Trial of oral Dilaudid for pain 3.  I will submit tissue for molecular testing 4.  Outpatient follow-up will be scheduled at the Cancer center  Addendum: I discussed the case with Dr. Lyndon Code.  He indicates the immunohistochemical stains on the liver do  not suggest a pancreaticobiliary primary.  We can hold on the EUS.  Tumor will be submitted for Foundation 1 testing.  We will consider further work-up for a primary genitourinary malignancy.  I will recommend an outpatient staging PET scan.     LOS: 10 days    Betsy Coder, MD   10/10/2017, 7:15 AM

## 2017-10-10 NOTE — Progress Notes (Signed)
[facility] Progress Note  7 Days Post-Op  Subjective: CC:  States post-op pain was initially incisional soreness that has progressed to significant RUQ/right sided back pain that is worse with sitting up or moving. Says these areas "lock up" and he experiences associated SOB. He has been able to sit up in the chair for max of 15 mins, has not been able to ambulate. Prior to surgery he says he was weak but was ambulatory, would walk up the stairs in his home one time per day with difficulty. He is tolerating PO but has poor appetite. Urinating. +flatus. Denies BM.   Objective: Vital signs in last 24 hours: Temp:  [98.4 F (36.9 C)-98.6 F (37 C)] 98.6 F (37 C) (07/29 0540) Pulse Rate:  [68-72] 68 (07/29 0540) Resp:  [16-19] 16 (07/29 0540) BP: (128-142)/(71-73) 142/73 (07/29 0540) SpO2:  [90 %-94 %] 94 % (07/29 0540) Weight:  [78 kg (172 lb)] 78 kg (172 lb) (07/29 0540) Last BM Date: (pta)  Intake/Output from previous day: 07/28 0701 - 07/29 0700 In: 2439.4 [P.O.:277; I.V.:1948.8; IV Piggyback:213.5] Out: 2295 [Urine:2230; Drains:65] Intake/Output this shift: No intake/output data recorded.  PE: Gen:  Alert, NAD, cooperative, appears uncomfortable  Card:  Regular rate and rhythm Pulm:  Normal effort, diminished breath sounds bilateral lung bases  Abd: Soft, mild TPP, incisions clean and dry, SS drainage in JP.  Skin: warm and dry, no rashes  Psych: A&Ox3   Lab Results:  Recent Labs    10/09/17 0603 10/10/17 0552  WBC 13.9* 12.8*  HGB 9.0* 9.9*  HCT 29.0* 31.1*  PLT 346 364   BMET Recent Labs    10/09/17 0603 10/10/17 0552  NA 138 137  K 3.7 3.4*  CL 104 104  CO2 25 25  GLUCOSE 93 112*  BUN 14 11  CREATININE 1.12 1.07  CALCIUM 8.2* 8.3*   PT/INR No results for input(s): LABPROT, INR in the last 72 hours. CMP     Component Value Date/Time   NA 137 10/10/2017 0552   K 3.4 (L) 10/10/2017 0552   CL 104 10/10/2017 0552   CO2 25 10/10/2017  0552   GLUCOSE 112 (H) 10/10/2017 0552   BUN 11 10/10/2017 0552   CREATININE 1.07 10/10/2017 0552   CALCIUM 8.3 (L) 10/10/2017 0552   PROT 6.2 (L) 10/10/2017 0552   ALBUMIN 2.0 (L) 10/10/2017 0552   AST 28 10/10/2017 0552   ALT 20 10/10/2017 0552   ALKPHOS 197 (H) 10/10/2017 0552   BILITOT 0.9 10/10/2017 0552   GFRNONAA >60 10/10/2017 0552   GFRAA >60 10/10/2017 0552   Lipase     Component Value Date/Time   LIPASE 20 10/01/2017 0535       Studies/Results: No results found.  Anti-infectives: Anti-infectives (From admission, onward)   Start     Dose/Rate Route Frequency Ordered Stop   10/05/17 1000  cefTRIAXone (ROCEPHIN) 2 g in sodium chloride 0.9 % 100 mL IVPB  Status:  Discontinued     2 g 200 mL/hr over 30 Minutes Intravenous Every 24 hours 10/05/17 0806 10/05/17 0828   10/05/17 0830  piperacillin-tazobactam (ZOSYN) IVPB 3.375 g     3.375 g 12.5 mL/hr over 240 Minutes Intravenous Every 8 hours 10/05/17 0828     10/03/17 1600  piperacillin-tazobactam (ZOSYN) IVPB 3.375 g     3.375 g 12.5 mL/hr over 240 Minutes Intravenous Every 8 hours 10/03/17 1535 10/04/17 0932   10/01/17 0600  piperacillin-tazobactam (ZOSYN) IVPB 3.375 g  Status:  Discontinued     3.375 g 12.5 mL/hr over 240 Minutes Intravenous Every 8 hours 09/30/17 2332 10/03/17 1525   09/30/17 2200  piperacillin-tazobactam (ZOSYN) IVPB 3.375 g     3.375 g 100 mL/hr over 30 Minutes Intravenous  Once 09/30/17 2152 09/30/17 2240     Assessment/Plan Type B aortic dissection Right renal mass/laparoscopic assisted right partial nephrectomy 07/28/2017, Dr. Alinda Money MultipleLiver metastasis with biliary obstruction - S/P ERCP x 2- pathology shows poorly differentiated metastatic carcinoma  - Dr. [name] following, ordered tumor markers, recommending EUS   Type 2 diabetes Hypertension Struct of sleep apnea-CPAP at night  Hxof umbilical and ventral hernias S/P TURP 09/08/2017 GERD CKD  - creatinine normalized    Cholecystitis/cholelithiasis POD#7-Laparoscopic cholecystectomy, suture repair of epigastric hernia, 10/03/2017 Dr. Excell Seltzer - afebrile, VSS, WBC 12.8 from 13.9 -On empiric Zosyn, I think this can be discontinued as acute cholecystitis has been adequately treated at this point.  - cont solid diet - continue PO pain control - cont aggressive pulm toilet and encourage increased activity, pt w/ significant physical deconditioning  - cont bowel regimen with miralax with senokot  ITV:IFXG mod, albumin is 2.0, prealbumin pending; add on protein shakes TID  ID: Zosyn 7/19-22, resumed Zosyn 7/24 >> (Day#6)  DVT: SCD/Lovenox Follow up: TBD  Plan: add K pad for his back (previously using on abdomen), increase robaxin and oxy scale (appreciate oncology recs for pain control as well), add ensure TID and check prealbumin.     LOS: 10 days    [facility] 10/10/2017, 8:22 AM Pager: [phone] Consults: [phone] Mon-Fri 7:00 am-4:30 pm Sat-Sun 7:00 am-11:30 am

## 2017-10-10 NOTE — Progress Notes (Signed)
PROGRESS NOTE    Jerry Weiss  YPP:[id number] DOB: 12-Oct-1955 DOA: 09/30/2017 PCP: [name], MD   Brief Narrative:  HPI On 09/30/2017 by [name] Opyd Jerry Weiss is a 62 y.o. male with medical history significant for hypertension, type 2 diabetes mellitus, type B aortic dissection, RCC status-post partial nephrectomy, and admission earlier this month for obstructive jaundice treated with CBD stent, now presenting to the ED with abdominal pain, nausea, and non-bloody vomiting.  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  He was admitted to the hospital 10 days ago with obstructive jaundice, had biliary stent placed, had a liver mass biopsied by IR with pathology not yet back, and was discharged home for outpatient follow-up.  He had been doing fairly well back at home until developing back pain yesterday and then severe abdominal pain today with nausea and nonbloody vomiting. No fever or diarrhea reported.   Interim history Patient admitted for acute cholecystitis, general surgery on board.  Restarted antibiotics on 7/24 given increase in WBC.   Assessment & Plan   Acute cholecystitis  -General surgery consulted and appreciated, status post laparoscopic cholecystectomy on 10/03/2017 -Continue pain control with oxycodone and Robaxin (will change to q6h) -WBC increased to 19 on 7/24, restarted zosyn -currently leukocytosis stable, 12.8 -able to tolerate solid diet, has not had a bowel movement however passing gas  Biliary obstruction status post stent -Patient with CBD stent placed earlier this month, stent has migrated causing cystic duct occlusion.  -Gastroenterology consulted and appreciated-recommended supportive management with IV fluids and antibiotics.  Did not feel indication for repeat ERCP he did not feel that biliary stent was an issue at this  time given the lack of biliary dilatation and improvement in LFTs.  Liver mass/metastasis -Status post biopsy by interventional radiology on 09/22/2017 -Biopsy was indeterminant although suspicious for malignancy. Results are back from [place]: ""These morphologic and immunophenotypical findings are consistent with a diagnosis of metastatic poorly differentiated carcinoma. The differential diagnosis includes collecting duct carcinoma and urothelial carcinoma. Tumors arising in the collecting ducts in the urothelium of the calyces can co-express PAX-8, GATA3 and p63. It is not possible to distinguish between these possibilities on this biopsy. 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 -Oncology, Dr. [name], consulted and appreciated. Ordered CEA, CA19-9. Submitted liver bx material for [employer] 1 testing. ?EUS.   Essential hypertension -Stable, continue amlodipine, hydralazine, labetalol -lisinopril held due to AKI  Diabetes mellitus, type II -Hemoglobin A1c 6.8 in June 2019 -Actos, Januvia held -Continue insulin sliding scale and CBG monitoring  Acute kidney injury -Creatinine had bumped to 1.42, Baseline creatinine 1 -Creatinine currently 1.07 -Continue gentle IVF -Continue to monitor BMP  Normocytic anemia -Stable, hemoglobin currently 9.9 -continue to monitor CBC  Transaminitis -Stable, suspect from liver metastasis  Hypokalemia -K 3.4, will order supplementation and obtain magnesium level -continue to monitor BMP  ?Depression -Patient complains of not feeling well, but cannot elaborate. Does not want to get out of bed.  -has not worked with PT -suspect secondary to malignancy diagnosis  -discussed psychiatry consult with patient,  declined -encouraged patient to get out of bed  DVT Prophylaxis  Lovenox  Code Status: Full  Family Communication: None at bedside  Disposition Plan: Admitted. Pending improvment  Consultants General surgery Gastroenterology Oncology  Procedures  Laparoscopic cholecystectomy  Antibiotics   Anti-infectives (From admission, onward)   Start     Dose/Rate Route Frequency Ordered Stop   10/05/17 1000  cefTRIAXone (ROCEPHIN) 2 g in sodium chloride 0.9 % 100 mL IVPB  Status:  Discontinued     2 g 200 mL/hr over 30 Minutes Intravenous Every 24 hours 10/05/17 0806 10/05/17 0828   10/05/17 0830  piperacillin-tazobactam (ZOSYN) IVPB 3.375 g     3.375 g 12.5 mL/hr over 240 Minutes Intravenous Every 8 hours 10/05/17 0828     10/03/17 1600  piperacillin-tazobactam (ZOSYN) IVPB 3.375 g     3.375 g 12.5 mL/hr over 240 Minutes Intravenous Every 8 hours 10/03/17 1535 10/04/17 0932   10/01/17 0600  piperacillin-tazobactam (ZOSYN) IVPB 3.375 g  Status:  Discontinued     3.375 g 12.5 mL/hr over 240 Minutes Intravenous Every 8 hours 09/30/17 2332 10/03/17 1525   09/30/17 2200  piperacillin-tazobactam (ZOSYN) IVPB 3.375 g     3.375 g 100 mL/hr over 30 Minutes Intravenous  Once 09/30/17 2152 09/30/17 2240      Subjective:   Jerry Weiss seen and examined today.  Feels better today. States he wants to get out of bed and walk. Denies current chest pain, shortness of breath, nausea, vomiting, dizziness, headache. Has abdominal soreness. Has had gas but no bowel movement.  Objective:   Vitals:   10/09/17 0812 10/09/17 1428 10/09/17 2102 10/10/17 0540  BP: (!) 153/66 129/71 128/72 (!) 142/73  Pulse: 70 72 70 68  Resp: 18 19 16 16   Temp: 98 F (36.7 C) 98.4 F (36.9 C) 98.5 F (36.9 C) 98.6 F (37 C)  TempSrc: Oral Oral Oral Oral  SpO2: 90% 90% 92% 94%  Weight:    78 kg (172 lb)  Height:        Intake/Output Summary (Last 24 hours) at 10/10/2017 1054 Last data filed at 10/10/2017  1045 Gross per 24 hour  Intake 2439.36 ml  Output 2055 ml  Net 384.36 ml   Filed Weights   10/03/17 0436 10/06/17 0819 10/10/17 0540  Weight: 80 kg (176 lb 5.9 oz) 76.2 kg (167 lb 15.9 oz) 78 kg (172 lb)   Exam  General: Well developed, chronically ill appearing, NAD  HEENT: NCAT, mucous membranes moist.   Neck: Supple  Cardiovascular: S1 S2 auscultated, no rubs, murmurs or gallops. Regular rate and rhythm.  Respiratory: Clear to auscultation bilaterally with equal chest rise  Abdomen: Soft, RUQ TTP, nondistended, + bowel sounds  Extremities: warm dry without cyanosis clubbing or edema  Neuro: AAOx3, nonfocal  Psych: Appropriate mood and affect  Data Reviewed: I have personally reviewed following labs and imaging studies  CBC: Recent Labs  Lab 10/06/17 0400 10/07/17 0454 10/08/17 0413 10/09/17 0603 10/10/17 0552  WBC 16.3* 16.7* 15.6* 13.9* 12.8*  HGB 9.2* 9.4* 9.6* 9.0* 9.9*  HCT 28.8* 30.2* 29.9* 29.0* 31.1*  MCV 93.5 94.7 93.1 94.5 91.7  PLT 317 322 330 346 497   Basic Metabolic Panel: Recent Labs  Lab 10/06/17 0400 10/07/17 0454 10/08/17 0413 10/09/17 0603 10/10/17 0552  NA 137 138 137 138 137  K 3.5 3.4* 3.5 3.7 3.4*  CL 101 100 102 104 104  CO2 27 27 25 25 25   GLUCOSE 118* 111* 121* 93 112*  BUN 16 19 16 14 11   CREATININE 1.25*  1.42* 1.22 1.12 1.07  CALCIUM 8.3* 8.3* 8.1* 8.2* 8.3*  MG  --  1.8  --   --   --    GFR: Estimated Creatinine Clearance: 66.9 mL/min (by C-G formula based on SCr of 1.07 mg/dL). Liver Function Tests: Recent Labs  Lab 10/05/17 0611 10/06/17 0400 10/07/17 0454 10/08/17 0413 10/10/17 0552  AST 32 22 22 19 28   ALT 31 25 23 20 20   ALKPHOS 184* 181* 166* 164* 197*  BILITOT 1.1 1.1 1.1 1.0 0.9  PROT 5.8* 5.9* 5.9* 5.9* 6.2*  ALBUMIN 2.3* 2.2* 2.1* 2.0* 2.0*   No results for input(s): LIPASE, AMYLASE in the last 168 hours. No results for input(s): AMMONIA in the last 168 hours. Coagulation Profile: No results  for input(s): INR, PROTIME in the last 168 hours. Cardiac Enzymes: No results for input(s): CKTOTAL, CKMB, CKMBINDEX, TROPONINI in the last 168 hours. BNP (last 3 results) No results for input(s): PROBNP in the last 8760 hours. HbA1C: No results for input(s): HGBA1C in the last 72 hours. CBG: Recent Labs  Lab 10/09/17 0815 10/09/17 1223 10/09/17 1655 10/09/17 2101 10/10/17 0833  GLUCAP 84 118* 104* 120* 102*   Lipid Profile: No results for input(s): CHOL, HDL, LDLCALC, TRIG, CHOLHDL, LDLDIRECT in the last 72 hours. Thyroid Function Tests: No results for input(s): TSH, T4TOTAL, FREET4, T3FREE, THYROIDAB in the last 72 hours. Anemia Panel: No results for input(s): VITAMINB12, FOLATE, FERRITIN, TIBC, IRON, RETICCTPCT in the last 72 hours. Urine analysis:    Component Value Date/Time   COLORURINE YELLOW 09/30/2017 2106   APPEARANCEUR CLEAR 09/30/2017 2106   LABSPEC 1.016 09/30/2017 2106   PHURINE 5.0 09/30/2017 2106   GLUCOSEU NEGATIVE 09/30/2017 2106   HGBUR SMALL (A) 09/30/2017 2106   [place] NEGATIVE 09/30/2017 2106   [place] NEGATIVE 09/30/2017 2106   PROTEINUR 30 (A) 09/30/2017 2106   NITRITE NEGATIVE 09/30/2017 2106   LEUKOCYTESUR SMALL (A) 09/30/2017 2106   Sepsis Labs: @LABRCNTIP (procalcitonin:4,lacticidven:4)  ) Recent Results (from the past 240 hour(s))  Culture, blood (routine x 2)     Status: None   Collection Time: 09/30/17 11:45 PM  Result Value Ref Range Status   Specimen Description BLOOD LEFT ARM  Final   Special Requests   Final    BOTTLES DRAWN AEROBIC AND ANAEROBIC Blood Culture adequate volume   Culture   Final    NO GROWTH 5 DAYS Performed at [facility], [place] [address] [place], [place] [address]    Report Status 10/06/2017 FINAL  Final  Culture, blood (routine x 2)     Status: None   Collection Time: 10/01/17 12:00 AM  Result Value Ref Range Status   Specimen Description BLOOD LEFT HAND  Final   Special Requests   Final     BOTTLES DRAWN AEROBIC ONLY Blood Culture adequate volume   Culture   Final    NO GROWTH 5 DAYS Performed at [facility], [place] [address], [place], [place] [address]    Report Status 10/06/2017 FINAL  Final  Surgical pcr screen     Status: None   Collection Time: 10/02/17  5:04 PM  Result Value Ref Range Status   MRSA, PCR NEGATIVE NEGATIVE Final   Staphylococcus aureus NEGATIVE NEGATIVE Final    Comment: (NOTE) The Xpert SA Assay (FDA approved for NASAL specimens in patients 74 years of age and older), is one component of a comprehensive surveillance program. It is not intended to diagnose infection nor to guide or  monitor treatment. Performed at [facility], [place] [address] [place], [place] [address]       Radiology Studies: No results found.   Scheduled Meds: . amLODipine  10 mg Oral Daily  . enoxaparin (LOVENOX) injection  40 mg Subcutaneous Q24H  . famotidine  20 mg Oral Daily  . feeding supplement (ENSURE ENLIVE)  237 mL Oral TID WC  . hydrALAZINE  100 mg Oral TID  . insulin aspart  0-9 Units Subcutaneous TID WC  . labetalol  400 mg Oral TID  . mouth rinse  15 mL Mouth Rinse BID  . methocarbamol  1,000 mg Oral QID  . polyethylene glycol  17 g Oral Daily  . senna  1 tablet Oral BID   Continuous Infusions: . sodium chloride 75 mL/hr at 10/10/17 0600  . lactated ringers 10 mL/hr at 10/07/17 1002  . piperacillin-tazobactam (ZOSYN)  IV 3.375 g (10/10/17 0511)     LOS: 10 days   Time Spent in minutes   30 minutes  Talesha Ellithorpe D.O. on 10/10/2017 at 10:54 AM  Between 7am to 7pm - Pager - [phone]  After 7pm go to [email or web address] - password [email or web address]  And look for the night coverage person covering for me after hours  [facility] Office  [phone]

## 2017-10-10 NOTE — Progress Notes (Signed)
PT Cancellation Note  Patient Details Name: Abdulkareem Badolato MRN: [id number] DOB: 02/06/1956   Cancelled Treatment:    Reason Eval/Treat Not Completed: Other (comment).  Pt refused.  Too nauseated to even get OOB to the chair.  RN aware (and actually in room relaying info to PT via phone).  I am concerned that he is going to get so weak and deconditioned that he will be unable to go home where his bedroom is up a full flight of stairs.    Thanks,    Barbarann Ehlers. [place], [place], DPT [phone]   10/10/2017, 1:36 PM

## 2017-10-10 NOTE — Progress Notes (Signed)
After administration of one time dose of Tramadol patient was able to sleep for a few hours. I just changed his dressing around his JP drain which had significant serosanquaneous leakage with slight bright red blood which also leaked onto bed pad saturating it and patient gown. Cleaned around site with guaze and NS and reinforced with split guaze and tape. Pt seemed to experience significant pain when moving side to side to change bed linens. His pain was 8/10 at that time so administered oxycodone. Patient then used incentive spirometer x10 for 1000 and didn't exhibit much difficulty or pain in effort. Will continue to reinforce importance of movement and incentive spirometer. Pt currently relaxed and resting

## 2017-10-11 ENCOUNTER — Other Ambulatory Visit: Payer: Self-pay | Admitting: Nurse Practitioner

## 2017-10-11 DIAGNOSIS — R160 Hepatomegaly, not elsewhere classified: Secondary | ICD-10-CM

## 2017-10-11 LAB — COMPREHENSIVE METABOLIC PANEL
ALT: 27 U/L (ref 0–44)
ANION GAP: 10 (ref 5–15)
AST: 39 U/L (ref 15–41)
Albumin: 2.2 g/dL — ABNORMAL LOW (ref 3.5–5.0)
Alkaline Phosphatase: 219 U/L — ABNORMAL HIGH (ref 38–126)
BUN: 11 mg/dL (ref 8–23)
CALCIUM: 8.5 mg/dL — AB (ref 8.9–10.3)
CO2: 25 mmol/L (ref 22–32)
Chloride: 103 mmol/L (ref 98–111)
Creatinine, Ser: 1.22 mg/dL (ref 0.61–1.24)
GFR calc non Af Amer: >60 mL/min (ref >60)
Glucose, Bld: 138 mg/dL — ABNORMAL HIGH (ref 70–99)
POTASSIUM: 3.9 mmol/L (ref 3.5–5.1)
SODIUM: 138 mmol/L (ref 135–145)
Total Bilirubin: 1.1 mg/dL (ref 0.3–1.2)
Total Protein: 6.4 g/dL — ABNORMAL LOW (ref 6.5–8.1)

## 2017-10-11 LAB — CBC
HCT: 31.2 % — ABNORMAL LOW (ref 39.0–52.0)
Hemoglobin: 9.9 g/dL — ABNORMAL LOW (ref 13.0–17.0)
MCH: 29.5 pg (ref 26.0–34.0)
MCHC: 31.7 g/dL (ref 30.0–36.0)
MCV: 92.9 fL (ref 78.0–100.0)
PLATELETS: 379 10*3/uL (ref 150–400)
RBC: 3.36 MIL/uL — ABNORMAL LOW (ref 4.22–5.81)
RDW: 14.6 % (ref 11.5–15.5)
WBC: 12.2 10*3/uL — ABNORMAL HIGH (ref 4.0–10.5)

## 2017-10-11 LAB — GLUCOSE, CAPILLARY
GLUCOSE-CAPILLARY: 92 mg/dL (ref 70–99)
GLUCOSE-CAPILLARY: 141 mg/dL — AB (ref 70–99)
Glucose-Capillary: 148 mg/dL — ABNORMAL HIGH (ref 70–99)
Glucose-Capillary: 134 mg/dL — ABNORMAL HIGH (ref 70–99)

## 2017-10-11 NOTE — Progress Notes (Signed)
[facility] Progress Note  8 Days Post-Op  Subjective: CC:  Pain improved on PO dilaudid prescribed by Dr. [name]. Tolerating PO, enjoyed his ensure shakes. Mobilized a short distance in the hall. Urinating without symptoms and having flatus.   Objective: Vital signs in last 24 hours: Temp:  [98.6 F (37 C)-99 F (37.2 C)] 99 F (37.2 C) (07/30 0420) Pulse Rate:  [73-78] 76 (07/30 0420) Resp:  [17-18] 18 (07/30 0420) BP: (130-147)/(68-87) 133/76 (07/30 0420) SpO2:  [94 %-96 %] 95 % (07/30 0420) Last BM Date: (pta)  Intake/Output from previous day: 07/29 0701 - 07/30 0700 In: 1954.8 [P.O.:1080; I.V.:750; IV Piggyback:124.8] Out: 1600 [Urine:1600] Intake/Output this shift: Total I/O In: -  Out: 20 [Drains:20]  PE: Gen:  Alert, NAD, cooperative, appears uncomfortable  Card:  Regular rate and rhythm Pulm:  Normal effort, diminished breath sounds bilateral lung bases  Abd: Soft, mild TPP, incisions clean and dry, SS drainage in JP.  Skin: warm and dry, no rashes  Psych: A&Ox3     Lab Results:  Recent Labs    10/10/17 0552 10/11/17 0636  WBC 12.8* 12.2*  HGB 9.9* 9.9*  HCT 31.1* 31.2*  PLT 364 379   BMET Recent Labs    10/10/17 0552 10/11/17 0636  NA 137 138  K 3.4* 3.9  CL 104 103  CO2 25 25  GLUCOSE 112* 138*  BUN 11 11  CREATININE 1.07 1.22  CALCIUM 8.3* 8.5*   PT/INR No results for input(s): LABPROT, INR in the last 72 hours. CMP     Component Value Date/Time   NA 138 10/11/2017 0636   K 3.9 10/11/2017 0636   CL 103 10/11/2017 0636   CO2 25 10/11/2017 0636   GLUCOSE 138 (H) 10/11/2017 0636   BUN 11 10/11/2017 0636   CREATININE 1.22 10/11/2017 0636   CALCIUM 8.5 (L) 10/11/2017 0636   PROT 6.4 (L) 10/11/2017 0636   ALBUMIN 2.2 (L) 10/11/2017 0636   AST 39 10/11/2017 0636   ALT 27 10/11/2017 0636   ALKPHOS 219 (H) 10/11/2017 0636   BILITOT 1.1 10/11/2017 0636   GFRNONAA >60 10/11/2017 0636   GFRAA >60 10/11/2017 0636    Lipase     Component Value Date/Time   LIPASE 20 10/01/2017 0535       Studies/Results: No results found.  Anti-infectives: Anti-infectives (From admission, onward)   Start     Dose/Rate Route Frequency Ordered Stop   10/05/17 1000  cefTRIAXone (ROCEPHIN) 2 g in sodium chloride 0.9 % 100 mL IVPB  Status:  Discontinued     2 g 200 mL/hr over 30 Minutes Intravenous Every 24 hours 10/05/17 0806 10/05/17 0828   10/05/17 0830  piperacillin-tazobactam (ZOSYN) IVPB 3.375 g     3.375 g 12.5 mL/hr over 240 Minutes Intravenous Every 8 hours 10/05/17 0828     10/03/17 1600  piperacillin-tazobactam (ZOSYN) IVPB 3.375 g     3.375 g 12.5 mL/hr over 240 Minutes Intravenous Every 8 hours 10/03/17 1535 10/04/17 0932   10/01/17 0600  piperacillin-tazobactam (ZOSYN) IVPB 3.375 g  Status:  Discontinued     3.375 g 12.5 mL/hr over 240 Minutes Intravenous Every 8 hours 09/30/17 2332 10/03/17 1525   09/30/17 2200  piperacillin-tazobactam (ZOSYN) IVPB 3.375 g     3.375 g 100 mL/hr over 30 Minutes Intravenous  Once 09/30/17 2152 09/30/17 2240     Assessment/Plan Type B aortic dissection Right renal mass/laparoscopic assisted right partial nephrectomy 07/28/2017, Dr. Alinda Money MultipleLiver metastasis with  biliary obstruction - S/P ERCP x 2- pathology shows poorly differentiated metastatic carcinoma - Dr. [name] following, ordered tumor markers, recommending EUS   Type 2 diabetes Hypertension Struct of sleep apnea-CPAP at night  Hxof umbilical and ventral hernias S/P TURP 09/08/2017 GERD CKD- creatinine normalized   Cholecystitis/cholelithiasis POD#7-Laparoscopic cholecystectomy, suture repair of epigastric hernia, 10/03/2017 Dr. Excell Seltzer - afebrile, VSS, WBC 12.2, downtrending  -On empiric Zosyn, I think this can be discontinued as acute cholecystitis has been adequately treated at this point.  - cont solid diet - continue PO pain control - cont aggressive pulm  toilet and encourage increased activity, pt w/ significant physical deconditioning  - cont bowel regimen with miralax with senokot  BZX:YDSW mod, prealbumin 8.6, continue ensure TID  ID: Zosyn 7/19-22, resumed Zosyn 7/24 >> (Day#7)  DVT: SCD/Lovenox Follow up: TBD  Plan: D/C abx. D/C JP drain. Stable for discharge from surgical perspective. Follow up with Dr. Excell Seltzer and Dr. [name].     LOS: 11 days    Jill Alexanders , [facility] Surgery 10/11/2017, 8:20 AM Pager: [phone] Consults: [phone] Mon-Fri 7:00 am-4:30 pm Sat-Sun 7:00 am-11:30 am

## 2017-10-11 NOTE — Progress Notes (Signed)
PROGRESS NOTE    Jerry Weiss  QBH:[id number] DOB: 06-17-1955 DOA: 09/30/2017 PCP: [name], MD   Brief Narrative:  HPI On 09/30/2017 by [name] Opyd Jahan Friedlander is a 62 y.o. male with medical history significant for hypertension, type 2 diabetes mellitus, type B aortic dissection, RCC status-post partial nephrectomy, and admission earlier this month for obstructive jaundice treated with CBD stent, now presenting to the ED with abdominal pain, nausea, and non-bloody vomiting.  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  He was admitted to the hospital 10 days ago with obstructive jaundice, had biliary stent placed, had a liver mass biopsied by IR with pathology not yet back, and was discharged home for outpatient follow-up.  He had been doing fairly well back at home until developing back pain yesterday and then severe abdominal pain today with nausea and nonbloody vomiting. No fever or diarrhea reported.   Interim history Patient admitted for acute cholecystitis, general surgery on board.  Restarted antibiotics on 7/24 given increase in WBC.   Assessment & Plan   Acute cholecystitis  -General surgery consulted and appreciated, status post laparoscopic cholecystectomy on 10/03/2017 -Continue pain control with oral dilaudid and Robaxin  -WBC increased to 19 on 7/24, restarted zosyn -currently leukocytosis stable, 12.8 -able to tolerate solid diet, has not had a bowel movement however passing gas  Biliary obstruction status post stent -Patient with CBD stent placed earlier this month, stent has migrated causing cystic duct occlusion.  -Gastroenterology consulted and appreciated-recommended supportive management with IV fluids and antibiotics.  Did not feel indication for repeat ERCP he did not feel that biliary stent was an issue at this time given the  lack of biliary dilatation and improvement in LFTs.  Liver mass/metastasis -Status post biopsy by interventional radiology on 09/22/2017 -Biopsy was indeterminant although suspicious for malignancy. Results are back from [place]: ""These morphologic and immunophenotypical findings are consistent with a diagnosis of metastatic poorly differentiated carcinoma. The differential diagnosis includes collecting duct carcinoma and urothelial carcinoma. Tumors arising in the collecting ducts in the urothelium of the calyces can co-express PAX-8, GATA3 and p63. It is not possible to distinguish between these possibilities on this biopsy. 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 -Oncology, Dr. [name], consulted and appreciated. Ordered CEA 3, CA19-9 5.3. Submitted liver bx material for [employer] 1 testing. ?EUS.  -Dr. [name] placed patient on oral Dilaudid which has helped his pain along with robaxin  Essential hypertension -Stable, continue amlodipine, hydralazine, labetalol -lisinopril held due to AKI  Diabetes mellitus, type II -Hemoglobin A1c 6.8 in June 2019 -Actos, Januvia held -Continue insulin sliding scale and CBG monitoring  Acute kidney injury -Creatinine had bumped to 1.42, Baseline creatinine 1 -Creatinine currently 1.2 -Continue gentle IVF -Continue to monitor BMP  Normocytic anemia -Stable, hemoglobin currently 9.9 -continue to monitor CBC  Transaminitis -Stable, suspect from liver metastasis  Hypokalemia -resolved with replacement, K 3.9 (Magnesium 1.9) -continue to monitor BMP  ?Depression -Patient complains of not feeling well, but cannot elaborate. Does not want to get out of bed.  -has not worked with PT - has declined 3  times -suspect secondary to malignancy diagnosis  -  discussed psychiatry consult with patient, declined -encouraged patient to get out of bed  DVT Prophylaxis  Lovenox  Code Status: Full  Family Communication: None at bedside  Disposition Plan: Admitted. Pending improvement and work with PT. Suspect home in 1-2 days  Consultants General surgery Gastroenterology Oncology  Procedures  Laparoscopic cholecystectomy  Antibiotics   Anti-infectives (From admission, onward)   Start     Dose/Rate Route Frequency Ordered Stop   10/05/17 1000  cefTRIAXone (ROCEPHIN) 2 g in sodium chloride 0.9 % 100 mL IVPB  Status:  Discontinued     2 g 200 mL/hr over 30 Minutes Intravenous Every 24 hours 10/05/17 0806 10/05/17 0828   10/05/17 0830  piperacillin-tazobactam (ZOSYN) IVPB 3.375 g     3.375 g 12.5 mL/hr over 240 Minutes Intravenous Every 8 hours 10/05/17 0828     10/03/17 1600  piperacillin-tazobactam (ZOSYN) IVPB 3.375 g     3.375 g 12.5 mL/hr over 240 Minutes Intravenous Every 8 hours 10/03/17 1535 10/04/17 0932   10/01/17 0600  piperacillin-tazobactam (ZOSYN) IVPB 3.375 g  Status:  Discontinued     3.375 g 12.5 mL/hr over 240 Minutes Intravenous Every 8 hours 09/30/17 2332 10/03/17 1525   09/30/17 2200  piperacillin-tazobactam (ZOSYN) IVPB 3.375 g     3.375 g 100 mL/hr over 30 Minutes Intravenous  Once 09/30/17 2152 09/30/17 2240      Subjective:   [name] seen and examined today.  She is feeling better today.  Was able to get up and walk some by himself.  Currently denies chest pain, shortness breath, vomiting, diarrhea, constipation, dizziness or headache.  Feels abdominal pain as well as nausea and muscle cramping has improved. Objective:   Vitals:   10/10/17 0540 10/10/17 1636 10/10/17 2158 10/11/17 0420  BP: (!) 142/73 (!) 147/87 130/68 133/76  Pulse: 68 78 73 76  Resp: 16 18 17 18   Temp: 98.6 F (37 C) 98.6 F (37 C) 98.9 F (37.2 C) 99 F (37.2 C)  TempSrc:  Oral Oral Oral Oral  SpO2: 94% 96% 94% 95%  Weight: 78 kg (172 lb)     Height:        Intake/Output Summary (Last 24 hours) at 10/11/2017 1249 Last data filed at 10/11/2017 1013 Gross per 24 hour  Intake 1814.76 ml  Output 945 ml  Net 869.76 ml   Filed Weights   10/03/17 0436 10/06/17 0819 10/10/17 0540  Weight: 80 kg (176 lb 5.9 oz) 76.2 kg (167 lb 15.9 oz) 78 kg (172 lb)   Exam  General: Well developed, chronically ill-appearing, no apparent distress  HEENT: NCAT, mucous membranes moist.   Neck: Supple  Cardiovascular: S1 S2 auscultated, no murmur, RRR  Respiratory: Clear to auscultation bilaterally with equal chest rise  Abdomen: Soft, RUQ TTP, nondistended, + bowel sounds  Extremities: warm dry without cyanosis clubbing or edema  Neuro: AAOx3, nonfocal  Psych: Pleasant, appropriate mood and affect  Data Reviewed: I have personally reviewed following labs and imaging studies  CBC: Recent Labs  Lab 10/07/17 0454 10/08/17 0413 10/09/17 0603 10/10/17 0552 10/11/17 0636  WBC 16.7* 15.6* 13.9* 12.8* 12.2*  HGB 9.4* 9.6* 9.0* 9.9* 9.9*  HCT 30.2* 29.9* 29.0* 31.1* 31.2*  MCV 94.7 93.1 94.5 91.7 92.9  PLT 322 330 346 364 696   Basic Metabolic Panel: Recent Labs  Lab 10/07/17 0454 10/08/17 0413 10/09/17 0603 10/10/17 0552 10/11/17 0636  NA 138 137 138 137 138  K 3.4* 3.5 3.7 3.4* 3.9  CL  100 102 104 104 103  CO2 27 25 25 25 25   GLUCOSE 111* 121* 93 112* 138*  BUN 19 16 14 11 11   CREATININE 1.42* 1.22 1.12 1.07 1.22  CALCIUM 8.3* 8.1* 8.2* 8.3* 8.5*  MG 1.8  --   --  1.9  --    GFR: Estimated Creatinine Clearance: 58.7 mL/min (by C-G formula based on SCr of 1.22 mg/dL). Liver Function Tests: Recent Labs  Lab 10/06/17 0400 10/07/17 0454 10/08/17 0413 10/10/17 0552 10/11/17 0636  AST 22 22 19 28  39  ALT 25 23 20 20 27   ALKPHOS 181* 166* 164* 197* 219*  BILITOT 1.1 1.1 1.0 0.9 1.1  PROT 5.9* 5.9* 5.9* 6.2* 6.4*  ALBUMIN 2.2* 2.1* 2.0* 2.0*  2.2*   No results for input(s): LIPASE, AMYLASE in the last 168 hours. No results for input(s): AMMONIA in the last 168 hours. Coagulation Profile: No results for input(s): INR, PROTIME in the last 168 hours. Cardiac Enzymes: No results for input(s): CKTOTAL, CKMB, CKMBINDEX, TROPONINI in the last 168 hours. BNP (last 3 results) No results for input(s): PROBNP in the last 8760 hours. HbA1C: No results for input(s): HGBA1C in the last 72 hours. CBG: Recent Labs  Lab 10/10/17 1312 10/10/17 1750 10/10/17 2103 10/11/17 0751 10/11/17 1228  GLUCAP 111* 108* 112* 148* 134*   Lipid Profile: No results for input(s): CHOL, HDL, LDLCALC, TRIG, CHOLHDL, LDLDIRECT in the last 72 hours. Thyroid Function Tests: No results for input(s): TSH, T4TOTAL, FREET4, T3FREE, THYROIDAB in the last 72 hours. Anemia Panel: No results for input(s): VITAMINB12, FOLATE, FERRITIN, TIBC, IRON, RETICCTPCT in the last 72 hours. Urine analysis:    Component Value Date/Time   COLORURINE YELLOW 09/30/2017 2106   APPEARANCEUR CLEAR 09/30/2017 2106   LABSPEC 1.016 09/30/2017 2106   PHURINE 5.0 09/30/2017 2106   GLUCOSEU NEGATIVE 09/30/2017 2106   HGBUR SMALL (A) 09/30/2017 2106   [address] NEGATIVE 09/30/2017 2106   [place] NEGATIVE 09/30/2017 2106   PROTEINUR 30 (A) 09/30/2017 2106   NITRITE NEGATIVE 09/30/2017 2106   LEUKOCYTESUR SMALL (A) 09/30/2017 2106   Sepsis Labs: @LABRCNTIP (procalcitonin:4,lacticidven:4)  ) Recent Results (from the past 240 hour(s))  Surgical pcr screen     Status: None   Collection Time: 10/02/17  5:04 PM  Result Value Ref Range Status   MRSA, PCR NEGATIVE NEGATIVE Final   Staphylococcus aureus NEGATIVE NEGATIVE Final    Comment: (NOTE) The Xpert SA Assay (FDA approved for NASAL specimens in patients 61 years of age and older), is one component of a comprehensive surveillance program. It is not intended to diagnose infection nor to guide or monitor  treatment. Performed at [facility], [place] [address], [place], [place] [address]       Radiology Studies: No results found.   Scheduled Meds: . amLODipine  10 mg Oral Daily  . enoxaparin (LOVENOX) injection  40 mg Subcutaneous Q24H  . famotidine  20 mg Oral Daily  . feeding supplement (ENSURE ENLIVE)  237 mL Oral TID WC  . hydrALAZINE  100 mg Oral TID  . insulin aspart  0-9 Units Subcutaneous TID WC  . labetalol  400 mg Oral TID  . mouth rinse  15 mL Mouth Rinse BID  . methocarbamol  1,000 mg Oral QID  . polyethylene glycol  17 g Oral Daily  . senna  1 tablet Oral BID   Continuous Infusions: . sodium chloride 75 mL/hr at 10/11/17 0633  . lactated ringers 10 mL/hr at 10/07/17  1002  . piperacillin-tazobactam (ZOSYN)  IV 3.375 g (10/11/17 0634)     LOS: 11 days   Time Spent in minutes   30 minutes  Ether Wolters D.O. on 10/11/2017 at 12:49 PM  Between 7am to 7pm - Pager - [phone]  After 7pm go to [email or web address] - password [email or web address]  And look for the night coverage person covering for me after hours  [facility] Office  [phone]

## 2017-10-11 NOTE — Progress Notes (Signed)
IP PROGRESS NOTE  Subjective:   Mr. Ethington reports partial improvement in the abdominal pain.  Dilaudid helped the pain.  He was able to ambulate this morning.  Objective: Vital signs in last 24 hours: Blood pressure 133/76, pulse 76, temperature 99 F (37.2 C), temperature source Oral, resp. rate 18, height 5' 7"  (1.702 m), weight 172 lb (78 kg), SpO2 95 %.  Intake/Output from previous day: 07/29 0701 - 07/30 0700 In: 1954.8 [P.O.:1080; I.V.:750; IV Piggyback:124.8] Out: 1600 [Urine:1600]  Physical Exam: Not performed today       Lab Results: Recent Labs    10/10/17 0552 10/11/17 0636  WBC 12.8* 12.2*  HGB 9.9* 9.9*  HCT 31.1* 31.2*  PLT 364 379    BMET Recent Labs    10/10/17 0552 10/11/17 0636  NA 137 138  K 3.4* 3.9  CL 104 103  CO2 25 25  GLUCOSE 112* 138*  BUN 11 11  CREATININE 1.07 1.22  CALCIUM 8.3* 8.5*    Lab Results  Component Value Date   CEA1 5.3 (H) 10/09/2017   CA 19-9 on 10/09/2017: 3  Medications: I have reviewed the patient's current medications.  Assessment/Plan:  1.  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 09/22/2017  CT abdomen/pelvis 09/20/2017- new hypodense liver lesions, gallbladder distention with intra-and extrahepatic biliary dilatation  ERCP 09/21/2017 and 09/23/2017, distal common duct stricture, status post placement of ridging, duct stents, cytology from biliary brushing-nondiagnostic  Ultrasound-guided biopsy of a right liver lesion 09/22/2017, metastatic poorly differentiated carcinoma  CT angiogram chest, abdomen, and pelvis 09/30/2017- ill-defined soft tissue in the uncinate process, stable type B aortic dissection, distended gallbladder, multiple hepatic metastases, retroperitoneal and periportal adenopathy, infarcted/atrophic left kidney  2.  Acute cholecystitis, status post a cholecystectomy 10/03/2017 3.  Aortic dissection with infarction of the left kidney December 2018 4.   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  BPH with urinary retention, status post TUR 09/08/2017 6.  OSA 7.  GERD 8.  Diabetes 9.  Anemia 10. Renal insufficiency-status post infarction of left kidney, partial right nephrectomy 11.  Pain-likely secondary to cholecystitis in the cholecystectomy versus metastatic carcinoma   Mr. Mannan has been diagnosed with metastatic carcinoma involving the liver.  A primary tumor site has not been defined.  I discussed the case with pathology yesterday.  The immunohistochemical stains on the liver biopsy are not suggestive of a pancreaticobiliary primary.  [employer] 1 testing was submitted.  I reviewed CT and MRI images with radiology.  We could not appreciate a pancreas mass.  The CA 19-9 is normal, making pancreas cancer a less likely diagnosis.  I will schedule an outpatient PET scan.  Recommendations: 1.  Continue postoperative care per medicine and surgery 2.  Outpatient PET scan 3.  Outpatient follow-up at the Cancer center during the week of 10/17/2017  Please call oncology as needed.     LOS: 11 days   Betsy Coder, MD   10/11/2017, 8:04 AM

## 2017-10-11 NOTE — Progress Notes (Signed)
PT Cancellation Note  Patient Details Name: Jerry Weiss MRN: [id number] DOB: [birth year]   Cancelled Treatment:    Reason Eval/Treat Not Completed: Other (comment).  Pt was coming out of the bathroom with IV pole when PT entered the room.  He sat down on the bed a reported feeling lightheaded, which passed with sitting.  He reported to therapist that he did not feel up for walking or attempting stairs today, but was agreeable for PT to come at around 11 am tomorrow (pt's preferred time).  PT will check back tomorrow as he will need to know if he can make it up to his second floor bedroom before he discharges.    Thanks,    Barbarann Ehlers. [place], [place], DPT [phone]   10/11/2017, 5:05 PM

## 2017-10-12 DIAGNOSIS — K8000 Calculus of gallbladder with acute cholecystitis without obstruction: Secondary | ICD-10-CM

## 2017-10-12 LAB — BASIC METABOLIC PANEL
ANION GAP: 11 (ref 5–15)
BUN: 11 mg/dL (ref 8–23)
CALCIUM: 8.2 mg/dL — AB (ref 8.9–10.3)
CO2: 23 mmol/L (ref 22–32)
Chloride: 103 mmol/L (ref 98–111)
Creatinine, Ser: 1.21 mg/dL (ref 0.61–1.24)
GFR calc Af Amer: >60 mL/min (ref >60)
GLUCOSE: 104 mg/dL — AB (ref 70–99)
POTASSIUM: 3.9 mmol/L (ref 3.5–5.1)
Sodium: 137 mmol/L (ref 135–145)

## 2017-10-12 LAB — GLUCOSE, CAPILLARY
GLUCOSE-CAPILLARY: 90 mg/dL (ref 70–99)
GLUCOSE-CAPILLARY: 148 mg/dL — AB (ref 70–99)
GLUCOSE-CAPILLARY: 116 mg/dL — AB (ref 70–99)
GLUCOSE-CAPILLARY: 165 mg/dL — AB (ref 70–99)

## 2017-10-12 LAB — CBC
HCT: 29.3 % — ABNORMAL LOW (ref 39.0–52.0)
Hemoglobin: 9 g/dL — ABNORMAL LOW (ref 13.0–17.0)
MCH: 29.1 pg (ref 26.0–34.0)
MCHC: 30.7 g/dL (ref 30.0–36.0)
MCV: 94.8 fL (ref 78.0–100.0)
PLATELETS: 323 10*3/uL (ref 150–400)
RBC: 3.09 MIL/uL — AB (ref 4.22–5.81)
RDW: 14.8 % (ref 11.5–15.5)
WBC: 13.2 10*3/uL — ABNORMAL HIGH (ref 4.0–10.5)

## 2017-10-12 NOTE — Progress Notes (Signed)
PT Cancellation Note/Discharge  Patient Details Name: Jerry Weiss MRN: [id number] DOB: 1955-09-02   Cancelled Treatment:    Reason Eval/Treat Not Completed: Pain limiting ability to participate.  Despite arranging a time to come to see pt today for PT, he continues to refuse stating he is in too much pain right now, can I come back later.  Unfortunately, every time we come back to check on him as well he refuses [place] again.  We have given him more than the standard of three refusals and d/c from therapy.  Unfortunately, we need to sign off as pt continues to refuse to work with [place].  I encouraged him to participate with mobility tech and if he felt up to it when she checked in later to go practice a few stairs in the stairwell.   PT to sign off due to too many consecutive refusals.  Thanks,    Barbarann Ehlers. Kashari Chalmers, PT, DPT [phone]   10/12/2017, 11:16 AM

## 2017-10-12 NOTE — Progress Notes (Signed)
1200cc brown colored emesis vomited this pm, prn phherneghan administered

## 2017-10-12 NOTE — Progress Notes (Signed)
PROGRESS NOTE    Jerry Weiss  PRX:[id number] DOB: 1955-07-05 DOA: 09/30/2017 PCP: [name], MD   Brief Narrative:  HPI On 09/30/2017 by [name] Opyd Henrry Weiss is a 62 y.o. male with medical history significant for hypertension, type 2 diabetes mellitus, type B aortic dissection, RCC status-post partial nephrectomy, and admission earlier this month for obstructive jaundice treated with CBD stent, now presenting to the ED with abdominal pain, nausea, and non-bloody vomiting.  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  He was admitted to the hospital 10 days ago with obstructive jaundice, had biliary stent placed, had a liver mass biopsied by IR with pathology not yet back, and was discharged home for outpatient follow-up.  He had been doing fairly well back at home until developing back pain yesterday and then severe abdominal pain today with nausea and nonbloody vomiting. No fever or diarrhea reported.   Interim history Patient admitted for acute cholecystitis, general surgery on board.  Restarted antibiotics on 7/24 given increase in WBC.   Assessment & Plan   Acute cholecystitis s/p surgery -General surgery consulted and appreciated, status post laparoscopic cholecystectomy on 10/03/2017 -Continue pain control with oral dilaudid and Robaxin  -WBC increased to 19 on 7/24, restarted zosyn--currently leukocytosis stable, 13.2, d/c ZOSYN 7/31 -vomit 1200 cc today--monitor on saline and re-assess am  Biliary obstruction status post stent -Patient with CBD stent placed earlier this month, stent has migrated causing cystic duct occlusion.  -Gastroenterology consulted and appreciated-recommended supportive management with IV fluids and antibiotics.  Did not feel indication for repeat ERCP he did not feel that biliary stent was an issue at this time given the  lack of biliary dilatation and improvement in LFTs -rpt labs in am 8/1.   Liver mass/metastasis -Status post biopsy by interventional radiology on 09/22/2017 -Biopsy was indeterminant although suspicious for malignancy. Results are back from [place]-Malignancy unclear etiopathogenesis -Oncology, Dr. [name], consulted and appreciated. Ordered CEA 3, CA19-9 5.3. [not pancreatic] Submitted liver bx material for Foundation 1 testing. ?EUS.  -Dr. [name] placed patient on oral Dilaudid which has helped his pain along with robaxin   Essential hypertension -controlled well onamlodipine, hydralazine, labetalol [copnsider switching to longer acting metoprolol as OP] -lisinopril held due to AKI which is resolved  Diabetes mellitus, type II -Hemoglobin A1c 6.8 in June 2019 -Actos, Januvia held -Continue insulin sliding scale and CBG monitoring  Acute kidney injury -Creatinine had bumped to 1.42, Baseline creatinine 1 -Creatinine currently 1.2 -saline at 75 cc/h given vomit  Normocytic anemia -Stable, hemoglobin currently 9.9 -continue to monitor CBC  Transaminitis -Stable, suspect from liver metastasis  Hypokalemia -resolved with replacement, K 3.9 (Magnesium 1.9) -continue to monitor BMP as prn  ?Depression -Patient complains of not feeling well, but cannot elaborate. Does not want to get out of bed.  -has not worked with PT - has declined 3 times  DVT Prophylaxis  Lovenox  Code Status: Full  Family Communication: None at bedside  Disposition Plan: Admitted. Pending improvement- home in 1-2 days if no vomit nor fever  Consultants General surgery Gastroenterology Oncology  Procedures  Laparoscopic cholecystectomy  Antibiotics   Anti-infectives (From admission, onward)   Start     Dose/Rate Route Frequency Ordered Stop   10/05/17 1000  cefTRIAXone (ROCEPHIN) 2 g in sodium chloride 0.9 % 100 mL IVPB  Status:  Discontinued     2 g 200 mL/hr over 30 Minutes Intravenous  Every 24 hours 10/05/17 0806 10/05/17 0828   10/05/17 0830  piperacillin-tazobactam (ZOSYN) IVPB 3.375 g     3.375 g 12.5 mL/hr over 240 Minutes Intravenous Every 8 hours 10/05/17 0828     10/03/17 1600  piperacillin-tazobactam (ZOSYN) IVPB 3.375 g     3.375 g 12.5 mL/hr over 240 Minutes Intravenous Every 8 hours 10/03/17 1535 10/04/17 0932   10/01/17 0600  piperacillin-tazobactam (ZOSYN) IVPB 3.375 g  Status:  Discontinued     3.375 g 12.5 mL/hr over 240 Minutes Intravenous Every 8 hours 09/30/17 2332 10/03/17 1525   09/30/17 2200  piperacillin-tazobactam (ZOSYN) IVPB 3.375 g     3.375 g 100 mL/hr over 30 Minutes Intravenous  Once 09/30/17 2152 09/30/17 2240      Subjective:   Omit lunch earlier-not bloody-no cp Passing stool No fever no chills Siting up  Objective:   Vitals:   10/11/17 2154 10/12/17 0452 10/12/17 0615 10/12/17 1408  BP: 130/73 (!) 149/71  132/63  Pulse: 75 67  67  Resp: 18 18  14   Temp: 98.9 F (37.2 C) 98.6 F (37 C)  98.6 F (37 C)  TempSrc: Oral Oral  Oral  SpO2: 94% 95%  94%  Weight:   79.3 kg (174 lb 13.2 oz)   Height:        Intake/Output Summary (Last 24 hours) at 10/12/2017 1456 Last data filed at 10/12/2017 0930 Gross per 24 hour  Intake 1618.07 ml  Output -  Net 1618.07 ml   Filed Weights   10/06/17 0819 10/10/17 0540 10/12/17 0615  Weight: 76.2 kg (167 lb 15.9 oz) 78 kg (172 lb) 79.3 kg (174 lb 13.2 oz)    Exam  eomi frail ncat nad  cta b  Tender RUQ  s1 s 2slight tahcy  No le edema  noeruo intact   Data Reviewed: I have personally reviewed following labs and imaging studies  CBC: Recent Labs  Lab 10/08/17 0413 10/09/17 0603 10/10/17 0552 10/11/17 0636 10/12/17 0624  WBC 15.6* 13.9* 12.8* 12.2* 13.2*  HGB 9.6* 9.0* 9.9* 9.9* 9.0*  HCT 29.9* 29.0* 31.1* 31.2* 29.3*  MCV 93.1 94.5 91.7 92.9 94.8  PLT 330 346 364 379 570   Basic Metabolic Panel: Recent Labs  Lab 10/07/17 0454 10/08/17 0413 10/09/17 0603  10/10/17 0552 10/11/17 0636 10/12/17 0624  NA 138 137 138 137 138 137  K 3.4* 3.5 3.7 3.4* 3.9 3.9  CL 100 102 104 104 103 103  CO2 27 25 25 25 25 23   GLUCOSE 111* 121* 93 112* 138* 104*  BUN 19 16 14 11 11 11   CREATININE 1.42* 1.22 1.12 1.07 1.22 1.21  CALCIUM 8.3* 8.1* 8.2* 8.3* 8.5* 8.2*  MG 1.8  --   --  1.9  --   --    GFR: Estimated Creatinine Clearance: 59.2 mL/min (by C-G formula based on SCr of 1.21 mg/dL). Liver Function Tests: Recent Labs  Lab 10/06/17 0400 10/07/17 0454 10/08/17 0413 10/10/17 0552 10/11/17 0636  AST 22 22 19 28  39  ALT 25 23 20 20 27   ALKPHOS 181* 166* 164* 197* 219*  BILITOT 1.1 1.1 1.0 0.9 1.1  PROT 5.9* 5.9* 5.9* 6.2* 6.4*  ALBUMIN 2.2* 2.1* 2.0* 2.0* 2.2*   No results for input(s): LIPASE, AMYLASE in the last 168 hours. No results for input(s): AMMONIA in the last 168 hours. Coagulation Profile: No results for input(s): INR,  PROTIME in the last 168 hours. Cardiac Enzymes: No results for input(s): CKTOTAL, CKMB, CKMBINDEX, TROPONINI in the last 168 hours. BNP (last 3 results) No results for input(s): PROBNP in the last 8760 hours. HbA1C: No results for input(s): HGBA1C in the last 72 hours. CBG: Recent Labs  Lab 10/11/17 1228 10/11/17 1702 10/11/17 2151 10/12/17 0751 10/12/17 1208  GLUCAP 134* 92 141* 90 148*   Lipid Profile: No results for input(s): CHOL, HDL, LDLCALC, TRIG, CHOLHDL, LDLDIRECT in the last 72 hours. Thyroid Function Tests: No results for input(s): TSH, T4TOTAL, FREET4, T3FREE, THYROIDAB in the last 72 hours. Anemia Panel: No results for input(s): VITAMINB12, FOLATE, FERRITIN, TIBC, IRON, RETICCTPCT in the last 72 hours. Urine analysis:    Component Value Date/Time   COLORURINE YELLOW 09/30/2017 2106   APPEARANCEUR CLEAR 09/30/2017 2106   LABSPEC 1.016 09/30/2017 2106   PHURINE 5.0 09/30/2017 2106   GLUCOSEU NEGATIVE 09/30/2017 2106   HGBUR SMALL (A) 09/30/2017 2106   [place] NEGATIVE 09/30/2017 2106    [place] NEGATIVE 09/30/2017 2106   PROTEINUR 30 (A) 09/30/2017 2106   NITRITE NEGATIVE 09/30/2017 2106   LEUKOCYTESUR SMALL (A) 09/30/2017 2106   Sepsis Labs: @LABRCNTIP (procalcitonin:4,lacticidven:4)  ) Recent Results (from the past 240 hour(s))  Surgical pcr screen     Status: None   Collection Time: 10/02/17  5:04 PM  Result Value Ref Range Status   MRSA, PCR NEGATIVE NEGATIVE Final   Staphylococcus aureus NEGATIVE NEGATIVE Final    Comment: (NOTE) The Xpert SA Assay (FDA approved for NASAL specimens in patients 18 years of age and older), is one component of a comprehensive surveillance program. It is not intended to diagnose infection nor to guide or monitor treatment. Performed at [facility], [place] [address], [place], [place] [address]       Radiology Studies: No results found.   Scheduled Meds: . amLODipine  10 mg Oral Daily  . enoxaparin (LOVENOX) injection  40 mg Subcutaneous Q24H  . famotidine  20 mg Oral Daily  . feeding supplement (ENSURE ENLIVE)  237 mL Oral TID WC  . hydrALAZINE  100 mg Oral TID  . insulin aspart  0-9 Units Subcutaneous TID WC  . labetalol  400 mg Oral TID  . mouth rinse  15 mL Mouth Rinse BID  . methocarbamol  1,000 mg Oral QID  . polyethylene glycol  17 g Oral Daily  . senna  1 tablet Oral BID   Continuous Infusions: . sodium chloride 75 mL/hr at 10/12/17 0946     LOS: 12 days   Time Spent in minutes   30 minutes  Verneita Griffes, MD [facility] [phone]

## 2017-10-12 NOTE — Progress Notes (Signed)
[facility] Progress Note  9 Days Post-Op  Subjective: CC:  No new complaints. States he is moving around better, tolerating PO, drinking ensure. Urinating without symptoms and having bowel function. Denies complications after JP removal.  Objective: Vital signs in last 24 hours: Temp:  [98.6 F (37 C)-98.9 F (37.2 C)] 98.6 F (37 C) (07/31 0452) Pulse Rate:  [67-75] 67 (07/31 0452) Resp:  [18] 18 (07/31 0452) BP: (128-149)/(71-73) 149/71 (07/31 0452) SpO2:  [94 %-96 %] 95 % (07/31 0452) Weight:  [79.3 kg (174 lb 13.2 oz)] 79.3 kg (174 lb 13.2 oz) (07/31 0615) Last BM Date: 10/11/17  Intake/Output from previous day: 07/30 0701 - 07/31 0700 In: 1404.1 [P.O.:460; I.V.:834.1; IV Piggyback:100] Out: 20 [Drains:20] Intake/Output this shift: No intake/output data recorded.  PE: Gen: Alert, NAD, laying in bed, blinds are pulled up.  Card: Regular rate and rhythm Pulm: Normal effort, diminished breath sounds bilateral lung bases Abd: Soft, obese,mild TPP, incisions clean and dry Skin: warm and dry, no rashes  Psych: A&Ox3, flat affect  Lab Results:  Recent Labs    10/11/17 0636 10/12/17 0624  WBC 12.2* 13.2*  HGB 9.9* 9.0*  HCT 31.2* 29.3*  PLT 379 323   BMET Recent Labs    10/11/17 0636 10/12/17 0624  NA 138 137  K 3.9 3.9  CL 103 103  CO2 25 23  GLUCOSE 138* 104*  BUN 11 11  CREATININE 1.22 1.21  CALCIUM 8.5* 8.2*   PT/INR No results for input(s): LABPROT, INR in the last 72 hours. CMP     Component Value Date/Time   NA 137 10/12/2017 0624   K 3.9 10/12/2017 0624   CL 103 10/12/2017 0624   CO2 23 10/12/2017 0624   GLUCOSE 104 (H) 10/12/2017 0624   BUN 11 10/12/2017 0624   CREATININE 1.21 10/12/2017 0624   CALCIUM 8.2 (L) 10/12/2017 0624   PROT 6.4 (L) 10/11/2017 0636   ALBUMIN 2.2 (L) 10/11/2017 0636   AST 39 10/11/2017 0636   ALT 27 10/11/2017 0636   ALKPHOS 219 (H) 10/11/2017 0636   BILITOT 1.1 10/11/2017 0636   GFRNONAA  >60 10/12/2017 0624   GFRAA >60 10/12/2017 0624   Lipase     Component Value Date/Time   LIPASE 20 10/01/2017 0535       Studies/Results: No results found.  Anti-infectives: Anti-infectives (From admission, onward)   Start     Dose/Rate Route Frequency Ordered Stop   10/05/17 1000  cefTRIAXone (ROCEPHIN) 2 g in sodium chloride 0.9 % 100 mL IVPB  Status:  Discontinued     2 g 200 mL/hr over 30 Minutes Intravenous Every 24 hours 10/05/17 0806 10/05/17 0828   10/05/17 0830  piperacillin-tazobactam (ZOSYN) IVPB 3.375 g     3.375 g 12.5 mL/hr over 240 Minutes Intravenous Every 8 hours 10/05/17 0828     10/03/17 1600  piperacillin-tazobactam (ZOSYN) IVPB 3.375 g     3.375 g 12.5 mL/hr over 240 Minutes Intravenous Every 8 hours 10/03/17 1535 10/04/17 0932   10/01/17 0600  piperacillin-tazobactam (ZOSYN) IVPB 3.375 g  Status:  Discontinued     3.375 g 12.5 mL/hr over 240 Minutes Intravenous Every 8 hours 09/30/17 2332 10/03/17 1525   09/30/17 2200  piperacillin-tazobactam (ZOSYN) IVPB 3.375 g     3.375 g 100 mL/hr over 30 Minutes Intravenous  Once 09/30/17 2152 09/30/17 2240     Assessment/Plan Type B aortic dissection Right renal mass/laparoscopic assisted right partial nephrectomy 07/28/2017, Dr. Alinda Money MultipleLiver  metastasis with biliary obstruction - S/P ERCP x 2- pathology shows poorly differentiated metastatic carcinoma -Dr. [name] following, ordered tumor markers, recommending EUS  Type 2 diabetes Hypertension Struct of sleep apnea-CPAP at night  Hxof umbilical and ventral hernias S/P TURP 09/08/2017 GERD CKD- creatininenormalized  Cholecystitis/cholelithiasis POD#8-Laparoscopic cholecystectomy, suture repair of epigastric hernia, 10/03/2017 Dr. Excell Seltzer -afebrile, VSS, WBC 13 - cont solid diet -continue PO pain control - contaggressive pulm toilet and encourage increased activity, pt w/ significant physical deconditioning -cont  bowel regimen withmiralax with senokot  CYE:LYHT mod, prealbumin 8.6, continue ensure TID  ID: Zosyn 7/19-22, resumed Zosyn 7/24-7/30  DVT: SCD/Lovenox Follow up: TBD  Plan: surgically stable, home per medical team. Follow up provided.     LOS: 12 days    Jill Alexanders , [facility] 10/12/2017, 8:06 AM Pager: [phone] Consults: [phone] Mon-Fri 7:00 am-4:30 pm Sat-Sun 7:00 am-11:30 am

## 2017-10-13 LAB — COMPREHENSIVE METABOLIC PANEL
ALBUMIN: 2.1 g/dL — AB (ref 3.5–5.0)
ALK PHOS: 223 U/L — AB (ref 38–126)
ALT: 29 U/L (ref 0–44)
AST: 35 U/L (ref 15–41)
Anion gap: 9 (ref 5–15)
BILIRUBIN TOTAL: 1 mg/dL (ref 0.3–1.2)
BUN: 9 mg/dL (ref 8–23)
CALCIUM: 8.3 mg/dL — AB (ref 8.9–10.3)
CO2: 26 mmol/L (ref 22–32)
Chloride: 106 mmol/L (ref 98–111)
Creatinine, Ser: 1.17 mg/dL (ref 0.61–1.24)
GFR calc Af Amer: >60 mL/min (ref >60)
GFR calc non Af Amer: >60 mL/min (ref >60)
GLUCOSE: 106 mg/dL — AB (ref 70–99)
Potassium: 3.2 mmol/L — ABNORMAL LOW (ref 3.5–5.1)
Sodium: 141 mmol/L (ref 135–145)
TOTAL PROTEIN: 5.8 g/dL — AB (ref 6.5–8.1)

## 2017-10-13 LAB — CBC WITH DIFFERENTIAL/PLATELET
ABS IMMATURE GRANULOCYTES: 0.1 10*3/uL (ref 0.0–0.1)
Basophils Absolute: 0.1 10*3/uL (ref 0.0–0.1)
Basophils Relative: 0 %
Eosinophils Absolute: 0.3 10*3/uL (ref 0.0–0.7)
Eosinophils Relative: 2 %
HEMATOCRIT: 28.2 % — AB (ref 39.0–52.0)
HEMOGLOBIN: 8.6 g/dL — AB (ref 13.0–17.0)
Immature Granulocytes: 1 %
Lymphocytes Relative: 17 %
Lymphs Abs: 2.5 10*3/uL (ref 0.7–4.0)
MCH: 28.9 pg (ref 26.0–34.0)
MCHC: 30.5 g/dL (ref 30.0–36.0)
MCV: 94.6 fL (ref 78.0–100.0)
MONO ABS: 0.9 10*3/uL (ref 0.1–1.0)
Monocytes Relative: 6 %
Neutro Abs: 10.6 10*3/uL — ABNORMAL HIGH (ref 1.7–7.7)
Neutrophils Relative %: 74 %
Platelets: 351 10*3/uL (ref 150–400)
RBC: 2.98 MIL/uL — AB (ref 4.22–5.81)
RDW: 15 % (ref 11.5–15.5)
WBC: 14.4 10*3/uL — ABNORMAL HIGH (ref 4.0–10.5)

## 2017-10-13 LAB — GLUCOSE, CAPILLARY
Glucose-Capillary: 108 mg/dL — ABNORMAL HIGH (ref 70–99)
Glucose-Capillary: 122 mg/dL — ABNORMAL HIGH (ref 70–99)
Glucose-Capillary: 106 mg/dL — ABNORMAL HIGH (ref 70–99)

## 2017-10-13 MED ORDER — POTASSIUM & SODIUM PHOSPHATES 280-160-250 MG PO PACK
1.0000 | PACK | Freq: Three times a day (TID) | ORAL | Status: DC
  Administered 2017-10-13 (×3): 1 via ORAL
  Filled 2017-10-13 (×7): qty 1

## 2017-10-13 NOTE — Progress Notes (Signed)
Stable from surgical perspective.  Tolerating PO, having bowel function.  We will sign off. Call as needed.

## 2017-10-13 NOTE — Progress Notes (Signed)
PROGRESS NOTE    Jerry Weiss  WHQ:[id number] DOB: 07-13-1955 DOA: 09/30/2017 PCP: [name], MD   Brief Narrative:   62 y.o. M hypertension, type 2 diabetes mellitus, type B aortic dissection, RCC status-post partial nephrectomy, and admission earlier this month for obstructive jaundice treated with CBD stent, now presenting to the ED with abdominal pain, nausea, and non-bloody vomiting.   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   He was admitted to the hospital 10 days ago with obstructive jaundice, had biliary stent placed, had a liver mass biopsied by IR with pathology not yet back, and was discharged home for outpatient follow-up.   He had been doing fairly well back at home until developing back pain yesterday and then severe abdominal pain today with nausea and nonbloody vomiting. No fever or diarrhea reported.   Interim history Patient admitted for acute cholecystitis, general surgery on board.  Restarted antibiotics on 7/24 given increase in WBC.   Assessment & Plan   Acute cholecystitis s/p surgery -General surgery consulted and appreciated, status post laparoscopic cholecystectomy on 10/03/2017 -Continue pain control with oral dilaudid and Robaxin  -WBC increased to 19 on 7/24, restarted zosyn--currently leukocytosis stable, 13.2, d/c ZOSYN 7/31 -no further vomit-OOB, Diet,Saline lock, cut back narcs in am  Biliary obstruction status post stent -Patient with CBD stent placed earlier this month, stent has migrated causing cystic duct occlusion.  -Gastroenterology consulted and appreciated-recommended supportive management with IV fluids and antibiotics.  Did not feel indication for repeat ERCP he did not feel that biliary stent was an issue at this time given the lack of biliary dilatation and improvement in LFTs -slight elevation wbc--monitor  am trend  Liver mass/metastasis -Status post biopsy by interventional radiology on 09/22/2017 -Biopsy was indeterminant although suspicious for malignancy. Results are back from [place]-Malignancy unclear etiopathogenesis -Oncology, Dr. [name], consulted and appreciated. Ordered CEA 3, CA19-9 5.3. [not pancreatic] Submitted liver bx material for Foundation 1 testing. ?EUS.  -Dr. [name] placed patient on oral Dilaudid which has helped his pain along with robaxin   Essential hypertension -controlled well onamlodipine, hydralazine, labetalol [consider switching to longer acting metoprolol as OP] -lisinopril held due to AKI which is resolved  Diabetes mellitus, type II -Hemoglobin A1c 6.8 in June 2019 -Actos, Januvia held -Continue insulin sliding scale and CBG monitoring  Acute kidney injury -Creatinine had bumped to 1.42, Baseline creatinine 1 -Creatinine currently 1.1 -saline locking today  Normocytic anemia -Stable, hemoglobin currently 9.9 -continue to monitor CBC  Transaminitis -Stable, suspect from liver metastasis  Hypokalemia -resolved with replacement, K 3.2  Giving k replacement today- BMP as prn  ?Depression -Patient complains of not feeling well, but cannot elaborate. Does not want to get out of bed.  -has not worked with PT - has declined 3 times  DVT Prophylaxis  Lovenox  Code Status: Full  Family Communication: None at bedside  Disposition Plan: Admitted. Pending improvement- home in 1-2 days if no vomit nor fever  Consultants General surgery Gastroenterology Oncology  Procedures  Laparoscopic cholecystectomy  Antibiotics   Anti-infectives (From admission, onward)   Start     Dose/Rate Route Frequency Ordered Stop   10/05/17 1000  cefTRIAXone (ROCEPHIN) 2 g in sodium chloride 0.9 % 100 mL IVPB  Status:  Discontinued     2 g 200 mL/hr over 30 Minutes Intravenous Every 24  hours 10/05/17 0806 10/05/17 0828   10/05/17 0830  piperacillin-tazobactam  (ZOSYN) IVPB 3.375 g  Status:  Discontinued     3.375 g 12.5 mL/hr over 240 Minutes Intravenous Every 8 hours 10/05/17 0828 10/12/17 1647   10/03/17 1600  piperacillin-tazobactam (ZOSYN) IVPB 3.375 g     3.375 g 12.5 mL/hr over 240 Minutes Intravenous Every 8 hours 10/03/17 1535 10/04/17 0932   10/01/17 0600  piperacillin-tazobactam (ZOSYN) IVPB 3.375 g  Status:  Discontinued     3.375 g 12.5 mL/hr over 240 Minutes Intravenous Every 8 hours 09/30/17 2332 10/03/17 1525   09/30/17 2200  piperacillin-tazobactam (ZOSYN) IVPB 3.375 g     3.375 g 100 mL/hr over 30 Minutes Intravenous  Once 09/30/17 2152 09/30/17 2240      Subjective:   Eating some Tired Pain 7/10 No chills no n/v No fever  Objective:   Vitals:   10/12/17 0615 10/12/17 1408 10/12/17 2025 10/13/17 0512  BP:  132/63 124/68 135/70  Pulse:  67 77 70  Resp:  14 18 18   Temp:  98.6 F (37 C) 98.7 F (37.1 C) 98.5 F (36.9 C)  TempSrc:  Oral Oral Oral  SpO2:  94% 92% 90%  Weight: 79.3 kg (174 lb 13.2 oz)     Height:        Intake/Output Summary (Last 24 hours) at 10/13/2017 1152 Last data filed at 10/13/2017 1042 Gross per 24 hour  Intake 2593.29 ml  Output -  Net 2593.29 ml   Filed Weights   10/06/17 0819 10/10/17 0540 10/12/17 0615  Weight: 76.2 kg (167 lb 15.9 oz) 78 kg (172 lb) 79.3 kg (174 lb 13.2 oz)    Exam  eomi frail ncat nad no distress no ict no pallor-flat affect  cta b no added sound  Tender RUQ wounds clean  s1 s2   No le edema  neuro intact   Data Reviewed: I have personally reviewed following labs and imaging studies  CBC: Recent Labs  Lab 10/09/17 0603 10/10/17 0552 10/11/17 0636 10/12/17 0624 10/13/17 0504  WBC 13.9* 12.8* 12.2* 13.2* 14.4*  NEUTROABS  --   --   --   --  10.6*  HGB 9.0* 9.9* 9.9* 9.0* 8.6*  HCT 29.0* 31.1* 31.2* 29.3* 28.2*  MCV 94.5 91.7 92.9 94.8 94.6  PLT 346 364 379 323 323   Basic Metabolic Panel: Recent Labs  Lab 10/07/17 0454  10/09/17 0603  10/10/17 0552 10/11/17 0636 10/12/17 0624 10/13/17 0504  NA 138   < > 138 137 138 137 141  K 3.4*   < > 3.7 3.4* 3.9 3.9 3.2*  CL 100   < > 104 104 103 103 106  CO2 27   < > 25 25 25 23 26   GLUCOSE 111*   < > 93 112* 138* 104* 106*  BUN 19   < > 14 11 11 11 9   CREATININE 1.42*   < > 1.12 1.07 1.22 1.21 1.17  CALCIUM 8.3*   < > 8.2* 8.3* 8.5* 8.2* 8.3*  MG 1.8  --   --  1.9  --   --   --    < > = values in this interval not displayed.   GFR: Estimated Creatinine Clearance: 61.2 mL/min (by C-G formula based on SCr of 1.17 mg/dL). Liver Function Tests: Recent Labs  Lab 10/07/17 0454 10/08/17 0413 10/10/17 0552 10/11/17 0636 10/13/17 0504  AST 22 19 28  39 35  ALT 23 20 20  27  29  ALKPHOS 166* 164* 197* 219* 223*  BILITOT 1.1 1.0 0.9 1.1 1.0  PROT 5.9* 5.9* 6.2* 6.4* 5.8*  ALBUMIN 2.1* 2.0* 2.0* 2.2* 2.1*   No results for input(s): LIPASE, AMYLASE in the last 168 hours. No results for input(s): AMMONIA in the last 168 hours. Coagulation Profile: No results for input(s): INR, PROTIME in the last 168 hours. Cardiac Enzymes: No results for input(s): CKTOTAL, CKMB, CKMBINDEX, TROPONINI in the last 168 hours. BNP (last 3 results) No results for input(s): PROBNP in the last 8760 hours. HbA1C: No results for input(s): HGBA1C in the last 72 hours. CBG: Recent Labs  Lab 10/12/17 0751 10/12/17 1208 10/12/17 1712 10/12/17 2128 10/13/17 0744  GLUCAP 90 148* 116* 165* 108*   Lipid Profile: No results for input(s): CHOL, HDL, LDLCALC, TRIG, CHOLHDL, LDLDIRECT in the last 72 hours. Thyroid Function Tests: No results for input(s): TSH, T4TOTAL, FREET4, T3FREE, THYROIDAB in the last 72 hours. Anemia Panel: No results for input(s): VITAMINB12, FOLATE, FERRITIN, TIBC, IRON, RETICCTPCT in the last 72 hours. Urine analysis:    Component Value Date/Time   COLORURINE YELLOW 09/30/2017 2106   APPEARANCEUR CLEAR 09/30/2017 2106   LABSPEC 1.016 09/30/2017 2106   PHURINE 5.0  09/30/2017 2106   GLUCOSEU NEGATIVE 09/30/2017 2106   HGBUR SMALL (A) 09/30/2017 2106   [place] NEGATIVE 09/30/2017 2106   [place] NEGATIVE 09/30/2017 2106   PROTEINUR 30 (A) 09/30/2017 2106   NITRITE NEGATIVE 09/30/2017 2106   LEUKOCYTESUR SMALL (A) 09/30/2017 2106   Sepsis Labs: @LABRCNTIP (procalcitonin:4,lacticidven:4)  ) No results found for this or any previous visit (from the past 240 hour(s)).    Radiology Studies: No results found.   Scheduled Meds: . amLODipine  10 mg Oral Daily  . enoxaparin (LOVENOX) injection  40 mg Subcutaneous Q24H  . famotidine  20 mg Oral Daily  . feeding supplement (ENSURE ENLIVE)  237 mL Oral TID WC  . hydrALAZINE  100 mg Oral TID  . insulin aspart  0-9 Units Subcutaneous TID WC  . labetalol  400 mg Oral TID  . mouth rinse  15 mL Mouth Rinse BID  . methocarbamol  1,000 mg Oral QID  . polyethylene glycol  17 g Oral Daily  . potassium & sodium phosphates  1 packet Oral TID WC & HS  . senna  1 tablet Oral BID   Continuous Infusions:    LOS: 13 days   Time Spent in minutes   20 minutes  Verneita Griffes, MD [facility] (P) [phone]

## 2017-10-14 LAB — RENAL FUNCTION PANEL
ALBUMIN: 2.2 g/dL — AB (ref 3.5–5.0)
Anion gap: 10 (ref 5–15)
BUN: 7 mg/dL — AB (ref 8–23)
CALCIUM: 8.5 mg/dL — AB (ref 8.9–10.3)
CO2: 25 mmol/L (ref 22–32)
CREATININE: 1.11 mg/dL (ref 0.61–1.24)
Chloride: 103 mmol/L (ref 98–111)
Glucose, Bld: 100 mg/dL — ABNORMAL HIGH (ref 70–99)
PHOSPHORUS: 3.7 mg/dL (ref 2.5–4.6)
Potassium: 3.4 mmol/L — ABNORMAL LOW (ref 3.5–5.1)
SODIUM: 138 mmol/L (ref 135–145)

## 2017-10-14 LAB — CBC WITH DIFFERENTIAL/PLATELET
ABS IMMATURE GRANULOCYTES: 0.1 10*3/uL (ref 0.0–0.1)
BASOS ABS: 0.1 10*3/uL (ref 0.0–0.1)
BASOS PCT: 0 %
EOS ABS: 0.3 10*3/uL (ref 0.0–0.7)
Eosinophils Relative: 2 %
HCT: 28.7 % — ABNORMAL LOW (ref 39.0–52.0)
Hemoglobin: 9 g/dL — ABNORMAL LOW (ref 13.0–17.0)
IMMATURE GRANULOCYTES: 1 %
Lymphocytes Relative: 19 %
Lymphs Abs: 2.7 10*3/uL (ref 0.7–4.0)
MCH: 29.3 pg (ref 26.0–34.0)
MCHC: 31.4 g/dL (ref 30.0–36.0)
MCV: 93.5 fL (ref 78.0–100.0)
Monocytes Absolute: 0.9 10*3/uL (ref 0.1–1.0)
Monocytes Relative: 7 %
NEUTROS ABS: 9.9 10*3/uL — AB (ref 1.7–7.7)
Neutrophils Relative %: 71 %
PLATELETS: 385 10*3/uL (ref 150–400)
RBC: 3.07 MIL/uL — AB (ref 4.22–5.81)
RDW: 15.1 % (ref 11.5–15.5)
WBC: 13.9 10*3/uL — AB (ref 4.0–10.5)

## 2017-10-14 LAB — GLUCOSE, CAPILLARY
GLUCOSE-CAPILLARY: 138 mg/dL — AB (ref 70–99)
GLUCOSE-CAPILLARY: 96 mg/dL (ref 70–99)
GLUCOSE-CAPILLARY: 112 mg/dL — AB (ref 70–99)

## 2017-10-14 MED ORDER — FAMOTIDINE 20 MG PO TABS: 20.0000 mg | ORAL_TABLET | Freq: Every day | ORAL | 3 refills | Status: DC

## 2017-10-14 MED ORDER — HYDROMORPHONE HCL 2 MG PO TABS: 2.0000 mg | ORAL_TABLET | ORAL | 0 refills | Status: DC | PRN

## 2017-10-14 MED ORDER — ENSURE ENLIVE PO LIQD: 237.0000 mL | Freq: Three times a day (TID) | ORAL | 12 refills | Status: DC

## 2017-10-14 MED ORDER — ONDANSETRON HCL 4 MG PO TABS: 4.0000 mg | ORAL_TABLET | Freq: Four times a day (QID) | ORAL | 0 refills | Status: DC | PRN

## 2017-10-14 MED ORDER — METHOCARBAMOL 500 MG PO TABS: 1000.0000 mg | ORAL_TABLET | Freq: Four times a day (QID) | ORAL | 0 refills | Status: DC

## 2017-10-14 MED ORDER — DIPHENHYDRAMINE HCL 25 MG PO CAPS: 25.0000 mg | ORAL_CAPSULE | Freq: Four times a day (QID) | ORAL | 0 refills | Status: DC | PRN

## 2017-10-14 NOTE — Progress Notes (Signed)
Pt IV site was taken out d/t leaking. Pt refused another IV to be place, states "he might go home in the morning". On call X. Blount,NP made aware via text page.

## 2017-10-14 NOTE — Discharge Summary (Signed)
Physician Discharge Summary  Jerry Weiss BOF:[id number] DOB: 1955/04/19 DOA: 09/30/2017  PCP: [name], MD  Admit date: 09/30/2017 Discharge date: 10/14/2017  Time spent: 45 minutes  Recommendations for Outpatient Follow-up:  1. Needs outpatient follow-up with Dr. Estrella Deeds has follow-up appointment scheduled with oncology 10/20/2017 for further management of his newly diagnosed metastatic poorly differentiated carcinoma 2. I would suggest follow-up with Dr. Alinda Money of urology on a as needed basis-he is status post robotic laparoscopic nephrectomy 07/28/2017 3. He has been given limited prescription of Dilaudid and Robaxin for what is presumably his postop abdominal pain in addition to his metastatic capsule pain 4. Would recommend outpatient screening PHQ 2/PHQ-9 for depression 5. Would recommend be met in 1 week as I am resuming lisinopril on discharge which was held previously 6. Ultimately recommend outpatient goals of care given metastatic process  Discharge Diagnoses:  Principal Problem:   Acute calculous cholecystitis s/p lap cholecystectomy 10/03/2017 Active Problems:   OSA (obstructive sleep apnea)   Renal cancer, right (HCC)   Hypodense mass of liver   DM2 (diabetes mellitus, type 2) (HCC)   HTN (hypertension)   Aortic dissection (HCC)   Acute cholangitis   Discharge Condition: Improved moderately  Diet recommendation: Heart healthy low-salt  Filed Weights   10/06/17 0819 10/10/17 0540 10/12/17 0615  Weight: 76.2 kg (167 lb 15.9 oz) 78 kg (172 lb) 79.3 kg (174 lb 13.2 oz)    History of present illness:  62 y.o.Mhypertension, type 2 diabetes mellitus, type B aortic dissection,RCC status-post partial nephrectomy, and admission earlier this month for obstructive jaundice treated with CBD stent by Dr. Watt Climes with ERCP 7/11, now presenting to the ED with abdominal pain, nausea, and non-bloody vomiting. 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 Alinda Money He was admitted to the hospital 10 days ago with obstructive jaundice, had biliary stent placed, had a liver mass biopsied by IR with pathology not yet back, and was discharged home for outpatient follow-up.  He had been doing fairly well back at home until developing back pain yesterday and then severe abdominal pain today with nausea and nonbloody vomiting. No fever or diarrhea reported.    Hospital Course:  Acute cholecystitis s/p surgery -General surgery consulted and appreciated, status post laparoscopic cholecystectomy on 10/03/2017 -Continue pain control with oral dilaudid and Robaxin  -WBC increased to 19 on 7/24, restarted zosyn--currently leukocytosis stable, 13.2, d/c ZOSYN 7/31 -no further vomit-OOB, Diet,Saline lock, cut back narcs in am -Discharging home with limited amount of narcotics Robaxin and has stabilized to some degree  Biliary obstruction status post stent -Patient with CBD stent placed earlier this month, stent has migrated causing cystic duct occlusion.  -Gastroenterology consulted and appreciated-recommended supportive management with IV fluids and antibiotics.  Did not feel indication for repeat ERCP he did not feel that biliary stent was an issue at this time given the lack of biliary dilatation and improvement in LFTs -slight elevation wbc--monitor am trend--white count on discharge 13 suspect this is secondary to his malignancy as he is not febrile and does not have any localizing source of infection at this time  Liver mass/metastasis -Status post biopsy by interventional radiology on 09/22/2017 -Biopsy was indeterminant although suspicious for malignancy. Results are back from [place]-Malignancy unclear etiopathogenesis -Oncology, Dr. [name], consulted and appreciated. Ordered CEA 3, CA19-9  5.3.  [not pancreatic] Submitted liver bx material for [facility] 1 testing. -Dr. [name] placed patient on oral Dilaudid which has helped his pain along with robaxin   Essential hypertension -controlled well onamlodipine, hydralazine, labetalol [consider switching to longer acting metoprolol as OP] -lisinopril held due to AKI which is resolved and meds have been resumed  Diabetes mellitus, type II -Hemoglobin A1c 6.8 in June 2019 -Actos, Januvia held actually and resumed on discharge  Acute kidney injury -Creatinine had bumped to 1.42, Baseline creatinine 1 -Creatinine currently 1.1 -saline locking today  Normocytic anemia -Stable, hemoglobin currently 9.9 -continue to monitor CBC  Transaminitis -Stable, suspect from liver metastasis  Hypokalemia -resolved with replacement, K 3.2  Giving k replacement today- BMP as prn as an outpatient  ?Depression -Patient complains of not feeling well, but cannot elaborate. Does not want to get out of bed.  -has not worked with PT - has declined 3 times -He was more mobile and able to get out of bed on 8/2 and was felt stabilizing for discharge   Procedures: Testing of liver biopsy-"These morphologic and immunophenotypical findings are consistent with a diagnosis of metastatic poorly differentiated carcinoma. The differential diagnosis includes collecting duct carcinoma and urothelial carcinoma. Tumors arising in the collecting ducts in the urothelium of the calyces can co-express PAX-8, GATA3 and p63. It is not possible to distinguish between these possibilities on this biopsy. 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 (i.e. Studies not automatically included, echos, thoracentesis, etc; not  x-rays)  Lap chole 7.22.19   Consultations:  Onco  Gi  gen surg  Discharge Exam: Vitals:   10/13/17 2105 10/14/17 0456  BP: (!) 145/73 (!) 148/81  Pulse: 73 74  Resp: 18 16  Temp: 99 F (37.2 C) 98.6 F (37 C)  SpO2: 94% 96%    General: Awake alert pleasant no distress EOMI NCAT no pallor no icterus Cardiovascular: S1-S2 no murmur rub or gallop Respiratory: Clinically clear no added sound  Discharge Instructions   Discharge Instructions    Diet - low sodium heart healthy   Complete by:  As directed    Discharge instructions   Complete by:  As directed    We have prescribed a limited amount of Robaxin in addition to Dilaudid which will need to be given to you for your liver capsule pain and can be refilled by Dr. Harrington Challenger Please follow-up with Dr. Malachy Mood as per his appointment and advice to you I would recommend that you also go slowly and start work only when you are very comfortable   Increase activity slowly   Complete by:  As directed      Allergies as of 10/14/2017      Reactions   Ciprofloxacin Other (See Comments)   Unknown   Codeine Other (See Comments)   Hallucinations    Shellfish-derived Products Swelling   "Eyes swell shut"      Medication List    STOP taking these medications   lisinopril 20 MG tablet Commonly known as:  PRINIVIL,ZESTRIL     TAKE these medications   amLODipine 10 MG tablet Commonly known as:  NORVASC Take 10 mg by mouth daily.   diphenhydrAMINE 25 mg capsule Commonly known as:  BENADRYL Take 1 capsule (25 mg total) by mouth every 6 (six) hours  as needed for itching.   famotidine 20 MG tablet Commonly known as:  PEPCID Take 1 tablet (20 mg total) by mouth daily. Start taking on:  10/15/2017   feeding supplement (ENSURE ENLIVE) Liqd Take 237 mLs by mouth 3 (three) times daily with meals.   gabapentin 300 MG capsule Commonly known as:  NEURONTIN Take 300 mg by mouth at bedtime.   hydrALAZINE 100 MG tablet Commonly  known as:  APRESOLINE Take 100 mg by mouth 3 (three) times daily.   HYDROmorphone 2 MG tablet Commonly known as:  DILAUDID Take 1-2 tablets (2-4 mg total) by mouth every 4 (four) hours as needed for moderate pain or severe pain.   labetalol 200 MG tablet Commonly known as:  NORMODYNE Take 400 mg by mouth 3 (three) times daily.   methocarbamol 500 MG tablet Commonly known as:  ROBAXIN Take 2 tablets (1,000 mg total) by mouth 4 (four) times daily.   neomycin-polymyxin-dexameth 0.1 % Oint Commonly known as:  MAXITROL Place 1 application into both eyes daily as needed (eye irritation).   ondansetron 4 MG tablet Commonly known as:  ZOFRAN Take 4 mg by mouth 2 (two) times daily as needed for nausea/vomiting.   pantoprazole 20 MG tablet Commonly known as:  PROTONIX Take 20 mg by mouth daily.   pioglitazone 15 MG tablet Commonly known as:  ACTOS Take 15 mg by mouth daily.   polyethylene glycol packet Commonly known as:  MIRALAX / GLYCOLAX Take 17 g by mouth daily as needed for mild constipation or moderate constipation.   sitaGLIPtin 100 MG tablet Commonly known as:  JANUVIA Take 100 mg by mouth daily.      Allergies  Allergen Reactions  . Ciprofloxacin Other (See Comments)    Unknown  . Codeine Other (See Comments)    Hallucinations   . Shellfish-Derived Products Swelling    "Eyes swell shut"   Follow-up Information    Excell Seltzer, MD Follow up on 10/17/2017.   Specialty:  General Surgery Why:  You have a DOW appointment with our PA at 11:30 AM.  Be at the office 30 minutes early for check in.  Bring photo ID and insurance information.   Contact information: [facility] [address] [phone]            The results of significant diagnostics from this hospitalization (including imaging, microbiology, ancillary and laboratory) are listed below for reference.    Significant Diagnostic Studies: Ct Abdomen Pelvis Wo  Contrast  Result Date: 09/20/2017 CLINICAL DATA:  Fatigue with nausea and vomiting. Recent TURP 2 weeks ago. History of renal cancer status post right partial nephrectomy. EXAM: CT ABDOMEN AND PELVIS WITHOUT CONTRAST TECHNIQUE: Multidetector CT imaging of the abdomen and pelvis was performed following the standard protocol without IV contrast. COMPARISON:  CT chest, abdomen, and pelvis dated May 25, 2017. FINDINGS: Lower chest: Linear scarring in the right lower lobe. Small pericardial effusion. Hepatobiliary: There are three questionable new hypodense lesions within the peripheral liver, measuring up to 2.9 cm. Unchanged cholelithiasis with new mild gallbladder distention. No gallbladder wall thickening. New common bile duct dilatation, measuring 1.4 cm in diameter, and mild central intrahepatic biliary dilatation. Pancreas: Unremarkable. No pancreatic ductal dilatation or surrounding inflammatory changes. Spleen: Normal in size without focal abnormality. Adrenals/Urinary Tract: The right adrenal gland is unremarkable. Unchanged 1.9 cm left adrenal adenoma. Unchanged infarcted appearance of the left kidney. Postsurgical changes related to interval right partial nephrectomy without definite focal lesion,  although evaluation is limited without intravenous contrast. No renal or ureteral calculi. No hydronephrosis. The bladder is decompressed. Stomach/Bowel: Stomach is within normal limits. Appendix appears normal. No evidence of bowel wall thickening, distention, or inflammatory changes. Vascular/Lymphatic: Aortic atherosclerosis. No enlarged abdominal or pelvic lymph nodes. Reproductive: Mild prostatomegaly, unchanged. Other: No free fluid or pneumoperitoneum. Unchanged fat containing supraumbilical ventral hernia and umbilical hernia. Musculoskeletal: No acute or significant osseous findings. IMPRESSION: 1. Unchanged cholelithiasis with new mild gallbladder distention and intra- and extrahepatic biliary  dilatation. No definite obstructing stone or lesion. Recommend correlation with LFTs and either ERCP or MRCP for further evaluation. 2. Three questionable new hypodensities within the peripheral liver measuring up to 2.9 cm, incompletely evaluated without intravenous contrast. Recommend right upper quadrant ultrasound to confirm these findings prior to potential further evaluation with MRI. 3. Postsurgical changes related to interval right partial nephrectomy. Evaluation for residual or recurrent tumor is limited without intravenous contrast. 4. Unchanged left renal infarction. 5. Small pericardial effusion. 6.  Aortic atherosclerosis (ICD10-I70.0). Electronically Signed   By: Titus Dubin M.D.   On: 09/20/2017 16:33   Dg Chest 2 View  Result Date: 09/30/2017 CLINICAL DATA:  Epigastric pain.  Known thoracic aneurysm. EXAM: CHEST - 2 VIEW COMPARISON:  CT chest 05/25/2017. FINDINGS: Normal heart size. Clear lung fields. No acute bony abnormality. No effusion or pneumothorax. Thoracic aortic enlargement, consistent with known dissection. IMPRESSION: No active cardiopulmonary disease. No apparent interval aortic enlargement or LEFT effusion. Electronically Signed   By: Staci Righter M.D.   On: 09/30/2017 17:59   US Biopsy (liver)  Result Date: 09/22/2017 INDICATION: Concern for renal cell carcinoma, now with multiple liver lesions worrisome for metastatic disease. Please perform ultrasound-guided liver lesion biopsy for tissue diagnostic purposes. EXAM: ULTRASOUND GUIDED LIVER LESION BIOPSY COMPARISON:  CT of the abdomen and pelvis - 09/20/2017 MEDICATIONS: None ANESTHESIA/SEDATION: Fentanyl 100 mcg IV; Versed 1.5 mg IV Total Moderate Sedation time:  13 Minutes. The patient's level of consciousness and vital signs were monitored continuously by radiology nursing throughout the procedure under my direct supervision. COMPLICATIONS: None immediate. PROCEDURE: Informed written consent was obtained from the  patient after a discussion of the risks, benefits and alternatives to treatment. The patient understands and consents the procedure. A timeout was performed prior to the initiation of the procedure. Ultrasound scanning was performed of the right upper abdominal quadrant demonstrates multiple hypoechoic nodules scattered throughout the right lobe of the liver. An approximately 1.7 x 1.8 cm hypoechoic nodule within the right lobe of the liver was targeted for biopsy given lesion location and sonographic window. The procedure was planned. The right upper abdominal quadrant was prepped and draped in the usual sterile fashion. The overlying soft tissues were anesthetized with 1% lidocaine with epinephrine. A 17 gauge, 6.8 cm co-axial needle was advanced into a peripheral aspect of the lesion. This was followed by 4 core biopsies with an 18 gauge core device under direct ultrasound guidance. The coaxial needle tract was embolized with a small amount of Gel-Foam slurry and superficial hemostasis was obtained with manual compression. Post procedural scanning was negative for definitive area of hemorrhage or additional complication. A dressing was placed. The patient tolerated the procedure well without immediate post procedural complication. IMPRESSION: Technically successful ultrasound guided core needle biopsy of indeterminate lesion within the right lobe of the liver. Electronically Signed   By: Sandi Mariscal M.D.   On: 09/22/2017 17:21   Dg Ercp Biliary & Pancreatic Ducts  Result Date:  09/23/2017 CLINICAL DATA:  62 year old male for ERCP EXAM: ERCP TECHNIQUE: Multiple spot images obtained with the fluoroscopic device and submitted for interpretation post-procedure. FLUOROSCOPY TIME:  Fluoroscopy Time:  9 minutes COMPARISON:  09/21/2017 FINDINGS: Limited images during ERCP. Images demonstrate endoscope projecting over the upper abdomen, with overlapping metallic stents. IMPRESSION: Limited images during ERCP  demonstrates overlapping metallic stents. Please refer to the dictated operative report for full details of intraoperative findings and procedure. Electronically Signed   By: Corrie Mckusick D.O.   On: 09/23/2017 12:27   Dg Ercp Biliary & Pancreatic Ducts  Result Date: 09/21/2017 CLINICAL DATA:  Common bile duct stone. Attempted biliary stent placement. EXAM: ERCP TECHNIQUE: Multiple spot images obtained with the fluoroscopic device and submitted for interpretation post-procedure. FLUOROSCOPY TIME:  18 minutes, 17 seconds COMPARISON:  CT abdomen and pelvis - 09/21/2017; MRCP - 06/24/2017 FINDINGS: Two spot intraoperative fluoroscopic images of the right upper abdominal quadrant during ERCP are provided for review Initial image demonstrates an ERCP probe overlying the right upper abdominal quadrant with selective cannulation opacification of the common bile duct which appears mild to moderately dilated. There is insufflation of a balloon within distal aspect of the CBD. Subsequent image demonstrates presumed sweeping of the CBD and presumed biliary sphincterotomy. IMPRESSION: ERCP as above. These images were submitted for radiologic interpretation only. Please see the procedural report for the amount of contrast and the fluoroscopy time utilized. Electronically Signed   By: Sandi Mariscal M.D.   On: 09/21/2017 12:59   Ct Angio Chest/abd/pel For Dissection W And/or Wo Contrast  Result Date: 09/30/2017 CLINICAL DATA:  63 year old male with back pain and abdominal pain. Nausea vomiting. Concern for aortic dissection. Known type B dissection. Recent ultrasound-guided biopsy of right lobe of the liver. EXAM: CT ANGIOGRAPHY CHEST, ABDOMEN AND PELVIS TECHNIQUE: Multidetector CT imaging through the chest, abdomen and pelvis was performed using the standard protocol during bolus administration of intravenous contrast. Multiplanar reconstructed images and MIPs were obtained and reviewed to evaluate the vascular anatomy.  CONTRAST:  175m ISOVUE-370 IOPAMIDOL (ISOVUE-370) INJECTION 76% COMPARISON:  CT of the abdomen pelvis dated 09/20/2017 and 05/25/2017 FINDINGS: CTA CHEST FINDINGS Cardiovascular: There is no cardiomegaly. Trace pericardial effusion similar to prior CT. Dissection flap origin ating in the aortic arch distal to the origin of the left subclavian artery consistent with known type B dissection and similar to prior CT. No aneurysmal dilatation. No change in the caliber of the aorta since the prior CT. No periaortic stranding or extravasation of contrast. The origins of the great vessels of the aortic arch are patent. There is no CT evidence of pulmonary embolism. Mediastinum/Nodes: No hilar or mediastinal adenopathy. The esophagus is grossly unremarkable. No mediastinal fluid collection or hematoma. Mild heterogeneity of the left lobe of the thyroid gland with probable small nodules. Ultrasound may provide better evaluation. Lungs/Pleura: There is a trace left pleural effusion. Minimal bibasilar dependent atelectasis as well as linear atelectasis/scarring at the right lung base. No focal consolidation, or pneumothorax. The central airways are patent. Musculoskeletal: No chest wall abnormality. No acute or significant osseous findings. Degenerative changes of the spine. Review of the MIP images confirms the above findings. CTA ABDOMEN AND PELVIS FINDINGS VASCULAR Aorta: Dissection flap extends along the entire length of the abdominal aorta. No aneurysmal dilatation. No change in the caliber of the aorta since the prior CT. No periaortic stranding or extravasation of contrast. Celiac: There is extension of the dissection flap into the celiac artery similar to prior  CT. No change in the caliber of the celiac artery. The celiac artery and its branches remain patent. There is a replaced left hepatic artery from the left gastric artery. SMA: The SMA is patent and arises from the true lumen. There may be extension of the  dissection flap to the origin of the SMA. Renals: The right renal artery is patent and arises from the true lumen. The left renal artery arises from the false lumen. The left renal artery is occluded similar to prior CT. IMA: The IMA is patent. The dissection flap extends to the origin of the IMA similar to prior CT. Inflow: Dissection extends to the proximal left common iliac artery similar to prior CT and terminates there. The iliac arteries are patent bilaterally. Veins: No obvious venous abnormality within the limitations of this arterial phase study. Review of the MIP images confirms the above findings. NON-VASCULAR There is no intra-abdominal free air.  Trace subhepatic free fluid. Hepatobiliary: Multiple hepatic hypodense lesions suspicious for metastatic disease. These lesions measure up to 2.7 cm in segment IV B. there has been interval placement of a biliary stent with associated pneumobilia. Evaluation for stent patency is limited on the CT. Some soft tissue density noted within the mid to distal lumen of the stent. The gallbladder is distended and contains multiple calcified stones. High attenuating content within the gallbladder likely vicarious excretion of contrast from recent procedure. There is mild diffuse gallbladder wall thickening and stranding which may be reactive. Clinical correlation is recommended. Pancreas: Ill-defined soft tissue density in the uncinate process of the pancreas is not well characterized. There is atrophy of the body and tail of the pancreas. No ductal dilatation. Spleen: Unremarkable. Adrenals/Urinary Tract: There is a 14 mm left adrenal nodule, indeterminate. The right adrenal gland is unremarkable. Infarcted and atrophic left kidney. Postsurgical changes related to partial right nephrectomy. There is no hydronephrosis on the right. The visualized ureters and urinary bladder appear unremarkable. Stomach/Bowel: Moderate stool throughout the colon. No bowel dilatation or  evidence of obstruction. Lymphatic: Multiple peripancreatic, portacaval enlarged lymph nodes. Multiple mildly rounded retroperitoneal and para-aortic lymph nodes. Reproductive: Mildly enlarged prostate gland measures approximately 5 cm in transverse diameter. Other: Anterior lower abdominal fat containing hernia similar prior CT. No inflammatory changes or fluid collection. Musculoskeletal: No acute or significant osseous findings. Review of the MIP images confirms the above findings. IMPRESSION: 1. Similar appearance of known type B dissection as the prior CT of 05/25/2017. No periaortic stranding, extraluminal contrast, or change in the aortic aortic caliber. Extension of the dissection flap into the celiac axis, origins of the SMA and IMA and occlusion of the left renal artery as seen previously. 2. Interval placement of a biliary stent. Apparent soft tissue density in the mid to distal lumen of the stent noted however, there is pneumobilia suggestive of stent patency. 3. Distended gallbladder with multiple stones and vicariously excreted contrast from prior imaging studies. There is mild thickened appearance of the gallbladder wall which may be reactive. Acute cholecystitis is not excluded. Clinical correlation is recommended. 4. Multiple hepatic metastatic disease. Recent biopsy of liver lesion. No large hematoma. 5. Ill-defined soft tissue in the uncinate process of the pancreas with atrophy of the distal gland. 6. Infarcted and atrophic left kidney and postsurgical changes of partial right nephrectomy. 7. Retroperitoneal, and periportal adenopathy. Electronically Signed   By: Anner Crete M.D.   On: 09/30/2017 21:37   US Abdomen Limited Ruq  Result Date: 09/30/2017 CLINICAL DATA:  Right upper quadrant abdominal pain. Status post right nephrectomy for renal cancer on 07/28/2017. Nausea, vomiting and obstructive jaundice. EXAM: ULTRASOUND ABDOMEN LIMITED RIGHT UPPER QUADRANT COMPARISON:  Abdomen and  pelvis CT dated 09/20/2017 and abdomen MR dated 06/24/2017. FINDINGS: Gallbladder: Diffuse gallbladder wall thickening with a maximum thickness of 4.5 mm. Sludge in the gallbladder. There is also a 1.2 cm shadowing stone in the gallbladder. There is a 2nd similar sized stone in the gallbladder on the recent CT. Minimal pericholecystic fluid. There was a positive sonographic Murphy sign. Common bile duct: Diameter: 3.8 mm Liver: Multiple masses of varying size and echogenicity. The largest measures 3.6 cm in maximum diameter. Normal liver echotexture. There is also evidence of pneumobilia in the left lobe of the liver. Portal vein is patent on color Doppler imaging with normal direction of blood flow towards the liver. IMPRESSION: 1. Cholelithiasis, sludge in the gallbladder, diffuse gallbladder wall thickening, minimal pericholecystic fluid and positive sonographic Murphy sign. This combination of findings is compatible with a diagnosis of acute cholecystitis. 2. Left lobe liver pneumobilia. 3. Multiple liver metastases. Electronically Signed   By: Claudie Revering M.D.   On: 09/30/2017 19:20   US Abdomen Limited Ruq  Result Date: 09/21/2017 CLINICAL DATA:  Hypodense liver masses on CT.  Obstructive jaundice. EXAM: ULTRASOUND ABDOMEN LIMITED RIGHT UPPER QUADRANT COMPARISON:  Abdominopelvic CT yesterday, CT 05/25/2017. FINDINGS: Gallbladder: Gallbladder is distended containing intraluminal stones and sludge. No gallbladder wall thickening, normal gallbladder wall thickness of 2 mm. No pericholecystic fluid. No sonographic Murphy sign noted by sonographer. Common bile duct: Diameter: 14 mm at the porta hepatis. No visualized choledocholithiasis. Liver: Three peripheral hypoechoic liver lesions measuring 3.4 cm, 2.8 cm, and 1.8 cm in the left and right hepatic lobe. These correspond to findings on CT. Background parenchymal echogenicity is normal. Portal vein is patent on color Doppler imaging with normal direction of  blood flow towards the liver. IMPRESSION: 1. Three peripheral hypoechoic liver lesions, largest measuring 3.4 cm. These have a nonspecific sonographic appearance, MRI would provide further characterization. Given these were not seen on prior exam, metastatic disease is considered if there is history of primary malignancy. 2. Distended gallbladder with stones and sludge, no gallbladder wall thickening. Common bile duct dilatation without visualized choledocholithiasis. Electronically Signed   By: Jeb Levering M.D.   On: 09/21/2017 07:04    Microbiology: No results found for this or any previous visit (from the past 240 hour(s)).   Labs: Basic Metabolic Panel: Recent Labs  Lab 10/10/17 0552 10/11/17 0636 10/12/17 0624 10/13/17 0504 10/14/17 0554  NA 137 138 137 141 138  K 3.4* 3.9 3.9 3.2* 3.4*  CL 104 103 103 106 103  CO2 _0 GLUCOSE 112* 138* 104* 106* 100*  BUN _1 7*  CREATININE 1.07 1.22 1.21 1.17 1.11  CALCIUM 8.3* 8.5* 8.2* 8.3* 8.5*  MG 1.9  --   --   --   --   PHOS  --   --   --   --  3.7   Liver Function Tests: Recent Labs  Lab 10/08/17 0413 10/10/17 0552 10/11/17 0636 10/13/17 0504 10/14/17 0554  AST 19 28 39 35  --   ALT _2 --   ALKPHOS 164* 197* 219* 223*  --   BILITOT 1.0 0.9 1.1 1.0  --   PROT 5.9* 6.2* 6.4* 5.8*  --   ALBUMIN 2.0* 2.0* 2.2* 2.1* 2.2*  No results for input(s): LIPASE, AMYLASE in the last 168 hours. No results for input(s): AMMONIA in the last 168 hours. CBC: Recent Labs  Lab 10/10/17 0552 10/11/17 0636 10/12/17 0624 10/13/17 0504 10/14/17 0554  WBC 12.8* 12.2* 13.2* 14.4* 13.9*  NEUTROABS  --   --   --  10.6* 9.9*  HGB 9.9* 9.9* 9.0* 8.6* 9.0*  HCT 31.1* 31.2* 29.3* 28.2* 28.7*  MCV 91.7 92.9 94.8 94.6 93.5  PLT 364 379 323 351 385   Cardiac Enzymes: No results for input(s): CKTOTAL, CKMB, CKMBINDEX, TROPONINI in the last 168 hours. BNP: BNP (last 3 results) No results for input(s): BNP in  the last 8760 hours.  ProBNP (last 3 results) No results for input(s): PROBNP in the last 8760 hours.  CBG: Recent Labs  Lab 10/13/17 0744 10/13/17 1423 10/13/17 1727 10/13/17 2109 10/14/17 0820  GLUCAP 108* 122* 106* 138* 96       Signed:  Nita Sells MD   [facility] 10/14/2017, 10:35 AM

## 2017-10-14 NOTE — Progress Notes (Signed)
Discussed discharge information with pt and pt spouse both verbalized understanding of follow up appointments and discharge medications and all discharge information.

## 2017-10-17 ENCOUNTER — Encounter (HOSPITAL_COMMUNITY): Payer: BLUE CROSS/BLUE SHIELD

## 2017-10-18 ENCOUNTER — Telehealth: Payer: Self-pay | Admitting: *Deleted

## 2017-10-18 DIAGNOSIS — Z9049 Acquired absence of other specified parts of digestive tract: Secondary | ICD-10-CM | POA: Diagnosis not present

## 2017-10-18 NOTE — Telephone Encounter (Signed)
Patients daughter called to find out about PET Scan that was declined by the insurance company. Patient has contacted his insurance company and they deny receiving any request for a PET scan by [place].  Confirmed order was sent, peer to peer was completed and again denied. Order was sent to managed care for the appeal process to begin.   Patient's daughter has questions about what is next if this scan can not be completed or if something else can be done. Daughter would like Dr. [name] to call her before OV on Thursday so she may prepare for the visit with her father.    Lauren [phone]  Contact information forwarded with message to Dr. [name] for advisement.

## 2017-10-20 ENCOUNTER — Telehealth: Payer: Self-pay | Admitting: Nurse Practitioner

## 2017-10-20 ENCOUNTER — Encounter: Payer: Self-pay | Admitting: Nurse Practitioner

## 2017-10-20 ENCOUNTER — Inpatient Hospital Stay: Payer: BLUE CROSS/BLUE SHIELD | Attending: Nurse Practitioner | Admitting: Nurse Practitioner

## 2017-10-20 VITALS — BP 135/92 | HR 74 | Temp 98.4°F | Resp 17 | Ht 67.0 in | Wt 166.4 lb

## 2017-10-20 DIAGNOSIS — C801 Malignant (primary) neoplasm, unspecified: Secondary | ICD-10-CM

## 2017-10-20 DIAGNOSIS — K219 Gastro-esophageal reflux disease without esophagitis: Secondary | ICD-10-CM | POA: Insufficient documentation

## 2017-10-20 DIAGNOSIS — D649 Anemia, unspecified: Secondary | ICD-10-CM | POA: Diagnosis not present

## 2017-10-20 DIAGNOSIS — C787 Secondary malignant neoplasm of liver and intrahepatic bile duct: Secondary | ICD-10-CM | POA: Diagnosis not present

## 2017-10-20 DIAGNOSIS — E119 Type 2 diabetes mellitus without complications: Secondary | ICD-10-CM | POA: Diagnosis not present

## 2017-10-20 DIAGNOSIS — I82401 Acute embolism and thrombosis of unspecified deep veins of right lower extremity: Secondary | ICD-10-CM | POA: Diagnosis not present

## 2017-10-20 DIAGNOSIS — C799 Secondary malignant neoplasm of unspecified site: Secondary | ICD-10-CM

## 2017-10-20 DIAGNOSIS — R109 Unspecified abdominal pain: Secondary | ICD-10-CM | POA: Diagnosis not present

## 2017-10-20 MED ORDER — MORPHINE SULFATE ER 15 MG PO TBCR: 15.0000 mg | EXTENDED_RELEASE_TABLET | Freq: Two times a day (BID) | ORAL | 0 refills | Status: DC

## 2017-10-20 MED ORDER — HYDROMORPHONE HCL 4 MG PO TABS: 4.0000 mg | ORAL_TABLET | ORAL | 0 refills | Status: DC | PRN

## 2017-10-20 NOTE — Progress Notes (Addendum)
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  He has noted swelling at the right abdomen extending laterally.  No erythema.  No fever.  He continues to have right-sided abdominal pain.  He is taking Dilaudid 4 mg every 4 hours around-the-clock.  He is having nausea making it difficult to eat.  Fluid intake overall is good.  No diarrhea.  He is weak.  Objective:  Vital signs in last 24 hours:  Blood pressure (!) 135/92, pulse 74, temperature 98.4 F (36.9 C), temperature source Oral, resp. rate 17, height 5' 7"  (1.702 m), weight 166 lb 6.4 oz (75.5 kg), SpO2 99 %.    HEENT: No thrush or ulcers. Resp: Lungs clear bilaterally. Cardio: Regular rate and rhythm. GI: Right-sided abdominal tenderness.  No hepatomegaly.  Right lateral abdominal wall edema.  Surgical incisions without erythema. Vascular: No leg edema. Neuro: Alert and oriented.   Lab Results:  Lab Results  Component Value Date   WBC 13.9 (H) 10/14/2017   HGB 9.0 (L) 10/14/2017   HCT 28.7 (L) 10/14/2017   MCV 93.5 10/14/2017   PLT 385 10/14/2017   NEUTROABS 9.9 (H) 10/14/2017    Imaging:  No results found.  Medications: I have reviewed the patient's current medications.  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 09/22/2017  CT abdomen/pelvis 09/20/2017- new hypodense liver lesions, gallbladder distention with intra-and extrahepatic biliary dilatation  ERCP 09/21/2017 and 09/23/2017, distal common duct stricture, status post placement of ridging, duct stents, cytology from biliary brushing-nondiagnostic  Ultrasound-guided biopsy of a right liver lesion 09/22/2017, metastatic poorly differentiated carcinoma  CT  angiogram chest, abdomen, and pelvis 09/30/2017- ill-defined soft tissue in the uncinate process, stable type B aortic dissection, distended gallbladder, multiple hepatic metastases, retroperitoneal and periportal adenopathy, infarcted/atrophic left kidney  2.Acute cholecystitis, status post a cholecystectomy 10/03/2017  Gallbladder pathology-acute and chronic cholecystitis with cholelithiasis; no evidence of malignancy  Pathology on cystic duct lymph node-metastatic carcinoma; CK7, PAX-8, CD10 and p63 positive; CK 20,  GATA-3, CDX-2 and CD117 negative 3.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  Pain-likely secondary to cholecystitis and the cholecystectomy versus metastatic carcinoma; MS Contin 15 mg every 12 hours beginning 10/20/2017, Dilaudid as needed  Disposition: Jerry Weiss has been diagnosed with metastatic poorly differentiated carcinoma involving the liver.  The primary site is unknown.  We have referred him for a PET scan to define the extent of disease, determine the etiology of his pain and potentially identify the primary tumor site.  We are awaiting insurance approval.  He is having significant right-sided abdominal pain.  He will begin MS Contin 15 mg every 12 hours and continue Dilaudid every 4 hours as needed.  He was provided with prescriptions for both at today's visit.  He will return for a follow-up visit in approximately 1 week.  He will contact the office in the interim with any problems.  Patient seen with Jerry Weiss.  25 minutes were spent face-to-face at today's visit with the majority of that time involved in counseling/coordination of  care.  Jerry Weiss   10/20/2017  3:44 PM This was a shared visit with Jerry Weiss.  Jerry Weiss was interviewed and examined.  He has been diagnosed with metastatic carcinoma of unknown primary.  He is symptomatic with abdominal pain.  We adjusted the narcotic regimen today.  I recommend a staging PET scan to define the extent of disease, help explain his pain, and look for a primary tumor site.  I am suspicious of a gastrointestinal malignancy based on his clinical presentation with biliary obstruction.   Foundation 1 testing on the liver biopsy is pending.  We will consider submitting tissue for a gene array assay for unknown primary cancers.  Jerry Weiss

## 2017-10-20 NOTE — Telephone Encounter (Signed)
Scheduled appt per 8/8 los - gave patient AVS and calender per los.

## 2017-10-21 ENCOUNTER — Telehealth: Payer: Self-pay | Admitting: *Deleted

## 2017-10-21 NOTE — Telephone Encounter (Signed)
Telephone call from patient's wife to check status of PA request. Debria Garret, LPN contacted and message left.

## 2017-10-21 NOTE — Telephone Encounter (Addendum)
Patient called and was unable to pick up pain medication last night. MS Contin needs a prior authorization per [employer]. Message left for [facility] in managed care to complete prior auth. Message also left on vm for Debria Garret, LPN

## 2017-10-24 NOTE — Progress Notes (Signed)
PA for morphine (MS Contin) 15 mg tabs has been submitted.

## 2017-10-24 NOTE — Telephone Encounter (Signed)
Returned call to pt to inform thst PA for morphine is being done today. Staff member that does PA's was out of office. Spoke with pt pharmacy to have a PA reqeust faxed to [facility]. LPN.    LVM for pt to return call to office.   Received call from [place]. Informed her of information above. She voiced understanding. Spoke directly with patient and daughter. Pt states that his vomiting frequency has increased. He is able to eat/drink but later in the day "it all comes up". This RN voiced understanding. Daughter states that he has a script for nausea but has not used it. Per Lattie Haw, NP pt to take zofran and monitor symptoms to see if the zofran alleviates symptoms. Pt voiced understanding. Will keep clinic updated with how he's doing

## 2017-10-25 NOTE — Progress Notes (Signed)
PA for Morphine Sulfate 15 mg tabs has been approved from 10/24/17 - 10/23/18.

## 2017-10-26 DIAGNOSIS — C801 Malignant (primary) neoplasm, unspecified: Secondary | ICD-10-CM | POA: Diagnosis not present

## 2017-10-26 DIAGNOSIS — C787 Secondary malignant neoplasm of liver and intrahepatic bile duct: Secondary | ICD-10-CM | POA: Diagnosis not present

## 2017-10-28 ENCOUNTER — Ambulatory Visit (HOSPITAL_COMMUNITY)
Admission: RE | Admit: 2017-10-28 | Discharge: 2017-10-28 | Disposition: A | Discharge status: Home / Self Care | Payer: BLUE CROSS/BLUE SHIELD | Source: Ambulatory Visit | Attending: Nurse Practitioner | Admitting: Nurse Practitioner

## 2017-10-28 ENCOUNTER — Inpatient Hospital Stay: Payer: BLUE CROSS/BLUE SHIELD | Admitting: Nurse Practitioner

## 2017-10-28 ENCOUNTER — Encounter: Payer: Self-pay | Admitting: Nurse Practitioner

## 2017-10-28 VITALS — BP 133/76 | HR 80 | Temp 98.1°F | Resp 18 | Ht 67.0 in | Wt 163.5 lb

## 2017-10-28 DIAGNOSIS — I82441 Acute embolism and thrombosis of right tibial vein: Secondary | ICD-10-CM | POA: Diagnosis not present

## 2017-10-28 DIAGNOSIS — R109 Unspecified abdominal pain: Secondary | ICD-10-CM | POA: Diagnosis not present

## 2017-10-28 DIAGNOSIS — C801 Malignant (primary) neoplasm, unspecified: Secondary | ICD-10-CM | POA: Diagnosis not present

## 2017-10-28 DIAGNOSIS — I82491 Acute embolism and thrombosis of other specified deep vein of right lower extremity: Secondary | ICD-10-CM | POA: Insufficient documentation

## 2017-10-28 DIAGNOSIS — C799 Secondary malignant neoplasm of unspecified site: Secondary | ICD-10-CM | POA: Diagnosis not present

## 2017-10-28 DIAGNOSIS — I82401 Acute embolism and thrombosis of unspecified deep veins of right lower extremity: Secondary | ICD-10-CM

## 2017-10-28 DIAGNOSIS — C787 Secondary malignant neoplasm of liver and intrahepatic bile duct: Secondary | ICD-10-CM

## 2017-10-28 DIAGNOSIS — D649 Anemia, unspecified: Secondary | ICD-10-CM | POA: Diagnosis not present

## 2017-10-28 DIAGNOSIS — K219 Gastro-esophageal reflux disease without esophagitis: Secondary | ICD-10-CM | POA: Diagnosis not present

## 2017-10-28 DIAGNOSIS — E119 Type 2 diabetes mellitus without complications: Secondary | ICD-10-CM | POA: Diagnosis not present

## 2017-10-28 DIAGNOSIS — R600 Localized edema: Secondary | ICD-10-CM | POA: Diagnosis not present

## 2017-10-28 MED ORDER — HYDROMORPHONE HCL 4 MG PO TABS: 4.0000 mg | ORAL_TABLET | ORAL | 0 refills | Status: DC | PRN

## 2017-10-28 MED ORDER — METOCLOPRAMIDE HCL 10 MG PO TABS: 10.0000 mg | ORAL_TABLET | Freq: Three times a day (TID) | ORAL | 1 refills | Status: DC

## 2017-10-28 MED ORDER — RIVAROXABAN (XARELTO) VTE STARTER PACK (15 & 20 MG): ORAL_TABLET | ORAL | 0 refills | Status: DC

## 2017-10-28 NOTE — Progress Notes (Signed)
Preliminary notes--Right lower extremity venous duplex exam completed.  Positive for Acute DVT involving posterior tibial veins, peroneal veins and gastrocnemius vein. Result called ordering physician's office spoke with Melia, patient was instructed directly to see the ordering physician right after the exam done.  Hongying Yides Saidi (RDMS RVT) 10/28/17  3:20 PM

## 2017-10-28 NOTE — Progress Notes (Addendum)
[facility] OFFICE PROGRESS NOTE   Diagnosis: Metastatic carcinoma  INTERVAL HISTORY:   Mr. Mode returns as scheduled.  He continues to have significant right-sided abdominal pain/pressure.  He is taking MS Contin every 12 hours and Dilaudid every 4 hours.  He has intermittent nausea/vomiting.  No diarrhea.  Last bowel movement was 3 days ago, described as "normal".  He denies fever.  Objective:  Vital signs in last 24 hours:  Blood pressure 133/76, pulse 80, temperature 98.1 F (36.7 C), temperature source Oral, resp. rate 18, height 5' 7"  (1.702 m), weight 163 lb 8 oz (74.2 kg), SpO2 97 %.    HEENT: No thrush.  Mouth appears dry. Resp: Lungs clear bilaterally. Cardio: Regular rate and rhythm. GI: Right-sided abdominal tenderness/abdominal wall edema.  No hepatomegaly. Vascular: Right leg with trace edema below the knee. Neuro: Alert and oriented.    Lab Results:  Lab Results  Component Value Date   WBC 13.9 (H) 10/14/2017   HGB 9.0 (L) 10/14/2017   HCT 28.7 (L) 10/14/2017   MCV 93.5 10/14/2017   PLT 385 10/14/2017   NEUTROABS 9.9 (H) 10/14/2017    Imaging:  No results found.  Medications: I have reviewed the patient's current medications.  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 09/22/2017  CT abdomen/pelvis 09/20/2017-new hypodense liver lesions, gallbladder distention with intra-and extrahepatic biliary dilatation  ERCP 09/21/2017 and 09/23/2017, distal common duct stricture, status post placement of ridging, duct stents, cytology from biliary brushing-nondiagnostic  Ultrasound-guided biopsy of a right liver lesion 09/22/2017, metastatic poorly differentiated carcinoma, MSI-stable, tumor mutation burden 5, KRAS G12R, KEIT amplification, PDGFRA amplification  CT angiogram chest, abdomen, and pelvis 09/30/2017-ill-defined soft tissue in the uncinate process, stable type B aortic  dissection, distended gallbladder, multiple hepatic metastases, retroperitoneal and periportal adenopathy, infarcted/atrophic left kidney  2.Acute cholecystitis, status post a cholecystectomy 10/03/2017  Gallbladder pathology-acute and chronic cholecystitis with cholelithiasis; no evidence of malignancy  Pathology on cystic duct lymph node-metastatic carcinoma; CK7, PAX-8, CD10 and p63 positive; CK 20,  GATA-3, CDX-2 and CD117 negative 3.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 Pain-likely secondary to cholecystitis and the cholecystectomy versus metastatic carcinoma; MS Contin 15 mg every 12 hours beginning 10/20/2017, Dilaudid as needed   Disposition: Mr. Ahlgren appears somewhat improved.  Dr. [name] discussed FOLFOX chemotherapy with Mr. Dalesandro and his daughter at today's visit.  We reviewed potential toxicities associated with chemotherapy including bone marrow toxicity, nausea, hair loss, allergic reaction.  We discussed the various forms of neuropathy associated with Oxaliplatin.  We reviewed potential toxicities associated with 5-fluorouracil including mouth sores, diarrhea, hand-foot syndrome, skin rash.  He agrees to proceed.  We are referring him for placement of a Port-A-Cath.  We are anticipating a start date for the chemotherapy of 11/09/2017.  We will see him in follow-up prior to treatment that day.  He is scheduled for a PET scan 11/03/2017.  He has right lower leg edema.  Preliminary doppler report indicates acute DVT involving posterior tibial veins, peroneal veins and gastrocnemius vein.  We reviewed the different options for anticoagulation.  He  understands there is a bleeding risk with any blood thinner.  We discussed Lovenox and Xarelto.  We mutually agreed on Xarelto.  A prescription for  the Xarelto starter pack was sent to his pharmacy.  For the nausea he is experiencing he will begin Reglan 10 mg 3 times daily.  We discussed the potential side effect of tardive dyskinesia.   He will return for lab, follow-up and cycle 1 FOLFOX 11/09/2017.  He will contact the office in the interim with any problems.  He will continue to work on nutrition and activity.  Patient seen with Dr. [name].  45 minutes were spent face-to-face at today's visit with the majority of that time involved in counseling/coordination of care.    Ned Card ANP/GNP-BC   10/28/2017  1:52 PM  This was a shared visit with Ned Card.  Mr. Fredin and examined.  He is confirmed to have a right leg DVT.  We discussed Xarelto versus Lovenox anticoagulation with him.  He will begin Xarelto today.  He continues to have nausea.  He will begin a trial of Reglan.  He has metastatic carcinoma without a known primary tumor site.  He likely has a gastrointestinal malignancy.  We reviewed results of Foundation 1 testing.  He is scheduled for a PET scan to define the tumor burden and look for a primary tumor site.  The PET may also help to explain his pain.  I recommend FOLFOX chemotherapy.  We reviewed the potential toxicities associated with the FOLFOX regimen.  He agrees to proceed.  He will be referred for Port-A-Cath placement and a chemotherapy teaching class. He will return for an office visit and the first cycle of FOLFOX on 11/09/2017.   [name], MD

## 2017-10-28 NOTE — Addendum Note (Signed)
Addended by: Betsy Coder B on: 10/28/2017 04:31 PM   Modules accepted: Level of Service

## 2017-10-31 ENCOUNTER — Telehealth: Payer: Self-pay | Admitting: Oncology

## 2017-10-31 NOTE — Telephone Encounter (Signed)
Spoke to patient regarding upcoming appts per 8/16 sch message

## 2017-11-01 ENCOUNTER — Telehealth: Payer: Self-pay

## 2017-11-01 ENCOUNTER — Inpatient Hospital Stay: Payer: BLUE CROSS/BLUE SHIELD

## 2017-11-01 NOTE — Telephone Encounter (Signed)
Spoke with pt daughter re: symptoms. Pt daughter reports that pt has had 2 episodes of emesis today. Pt drank water and ensure this am and "both came up". Daughter reports that 1st episode of emesis was "normalish color", but the second episode was "spinach green". The pt is now resting and daughter denies that he has had anything additional to eat or drink. Daughter also states "he ate good last night". This RN voiced understanding. Per Dr. [name], pt to attempt to slowly sip fluids, advised to start with water. If unable to tolerate fluids, pt to be evaluated in the ED. Daughter voiced understanding.

## 2017-11-02 ENCOUNTER — Telehealth: Payer: Self-pay | Admitting: Nurse Practitioner

## 2017-11-02 NOTE — Telephone Encounter (Signed)
Scheduled appt per 8/16 los - did not call patient with appts - 1st treatment scheduled for 8/28 - port placement on 8/29 per Dr. [name] try to change port placement to earlier day - [place] working on r/s

## 2017-11-03 ENCOUNTER — Other Ambulatory Visit: Payer: Self-pay

## 2017-11-03 ENCOUNTER — Ambulatory Visit (HOSPITAL_COMMUNITY)
Admission: RE | Admit: 2017-11-03 | Discharge: 2017-11-03 | Disposition: A | Discharge status: Home / Self Care | Payer: BLUE CROSS/BLUE SHIELD | Source: Ambulatory Visit | Attending: Nurse Practitioner | Admitting: Nurse Practitioner

## 2017-11-03 DIAGNOSIS — J90 Pleural effusion, not elsewhere classified: Secondary | ICD-10-CM | POA: Diagnosis not present

## 2017-11-03 DIAGNOSIS — R160 Hepatomegaly, not elsewhere classified: Secondary | ICD-10-CM | POA: Diagnosis not present

## 2017-11-03 DIAGNOSIS — N400 Enlarged prostate without lower urinary tract symptoms: Secondary | ICD-10-CM | POA: Insufficient documentation

## 2017-11-03 DIAGNOSIS — I7103 Dissection of thoracoabdominal aorta: Secondary | ICD-10-CM | POA: Insufficient documentation

## 2017-11-03 DIAGNOSIS — C801 Malignant (primary) neoplasm, unspecified: Secondary | ICD-10-CM | POA: Diagnosis not present

## 2017-11-03 DIAGNOSIS — C787 Secondary malignant neoplasm of liver and intrahepatic bile duct: Secondary | ICD-10-CM | POA: Insufficient documentation

## 2017-11-03 DIAGNOSIS — J9811 Atelectasis: Secondary | ICD-10-CM | POA: Insufficient documentation

## 2017-11-03 DIAGNOSIS — I700 Atherosclerosis of aorta: Secondary | ICD-10-CM | POA: Insufficient documentation

## 2017-11-03 DIAGNOSIS — I517 Cardiomegaly: Secondary | ICD-10-CM | POA: Diagnosis not present

## 2017-11-03 DIAGNOSIS — I313 Pericardial effusion (noninflammatory): Secondary | ICD-10-CM | POA: Diagnosis not present

## 2017-11-03 DIAGNOSIS — R188 Other ascites: Secondary | ICD-10-CM | POA: Diagnosis not present

## 2017-11-03 LAB — GLUCOSE, CAPILLARY: Glucose-Capillary: 120 mg/dL — ABNORMAL HIGH (ref 70–99)

## 2017-11-03 MED ORDER — ONDANSETRON HCL 4 MG PO TABS: 4.0000 mg | ORAL_TABLET | Freq: Two times a day (BID) | ORAL | 0 refills | Status: DC | PRN

## 2017-11-03 MED ORDER — FLUDEOXYGLUCOSE F - 18 (FDG) INJECTION
8.2000 | Freq: Once | INTRAVENOUS | Status: AC | PRN
  Administered 2017-11-03: 8.2 via INTRAVENOUS

## 2017-11-04 ENCOUNTER — Telehealth: Payer: Self-pay

## 2017-11-04 ENCOUNTER — Other Ambulatory Visit: Payer: Self-pay | Admitting: Oncology

## 2017-11-04 DIAGNOSIS — Z7189 Other specified counseling: Secondary | ICD-10-CM

## 2017-11-04 DIAGNOSIS — C250 Malignant neoplasm of head of pancreas: Secondary | ICD-10-CM | POA: Insufficient documentation

## 2017-11-04 NOTE — Telephone Encounter (Signed)
Called to speak with pt/family regarding PET results. No answer/unable to leave voicemail

## 2017-11-04 NOTE — Progress Notes (Signed)
START ON PATHWAY REGIMEN - Pancreatic     A cycle is every 14 days:     Oxaliplatin      Leucovorin      Irinotecan      5-Fluorouracil      5-Fluorouracil   **Always confirm dose/schedule in your pharmacy ordering system**  Patient Characteristics: Adenocarcinoma, Metastatic Disease, First Line, PS = 0, 1 Histology: Adenocarcinoma Current evidence of distant metastases<= Yes AJCC T Category: Staged < 8th Ed. AJCC N Category: Staged < 8th Ed. AJCC M Category: Staged < 8th Ed. AJCC 8 Stage Grouping: Staged < 8th Ed. Line of Therapy: First Line  Intent of Therapy: Non-Curative / Palliative Intent, Discussed with Patient

## 2017-11-06 ENCOUNTER — Other Ambulatory Visit: Payer: Self-pay | Admitting: Oncology

## 2017-11-07 ENCOUNTER — Other Ambulatory Visit: Payer: Self-pay | Admitting: Oncology

## 2017-11-07 ENCOUNTER — Encounter: Payer: Self-pay | Admitting: Pharmacist

## 2017-11-07 ENCOUNTER — Other Ambulatory Visit: Payer: Self-pay | Admitting: *Deleted

## 2017-11-07 DIAGNOSIS — C250 Malignant neoplasm of head of pancreas: Secondary | ICD-10-CM

## 2017-11-07 NOTE — Telephone Encounter (Signed)
Telephone call to patient- discussed PET scan results as directed. Patient verbalized an understanding.   PICC line insertion scheduled for Wednesday prior to chemo. Patient is now scheduled for Sept 6th at noon for port placement. Patient advised of schedule and understands plan. PICC line will be removed following pump d/c on Friday 8/30.

## 2017-11-08 ENCOUNTER — Other Ambulatory Visit: Payer: Self-pay | Admitting: Physician Assistant

## 2017-11-08 ENCOUNTER — Telehealth: Payer: Self-pay

## 2017-11-08 DIAGNOSIS — C799 Secondary malignant neoplasm of unspecified site: Secondary | ICD-10-CM

## 2017-11-08 NOTE — Telephone Encounter (Signed)
Received call from pt daughter regarding urinary symptoms. Pt reports that he has some "slight stinging with urination. Symptoms have subsided today, just feels an occasional twinge". Pt denies blood in urine, confusion, etc. Encouraged pt to keep appts as scheduled tomorrow. Will obtain urinalysis, ok per MD.

## 2017-11-09 ENCOUNTER — Ambulatory Visit: Payer: BLUE CROSS/BLUE SHIELD

## 2017-11-09 ENCOUNTER — Inpatient Hospital Stay: Payer: BLUE CROSS/BLUE SHIELD

## 2017-11-09 ENCOUNTER — Emergency Department (HOSPITAL_COMMUNITY): Payer: BLUE CROSS/BLUE SHIELD

## 2017-11-09 ENCOUNTER — Other Ambulatory Visit: Payer: Self-pay

## 2017-11-09 ENCOUNTER — Other Ambulatory Visit: Payer: Self-pay | Admitting: Oncology

## 2017-11-09 ENCOUNTER — Encounter (HOSPITAL_COMMUNITY): Payer: Self-pay | Admitting: *Deleted

## 2017-11-09 ENCOUNTER — Ambulatory Visit (HOSPITAL_COMMUNITY)
Admission: RE | Admit: 2017-11-09 | Discharge: 2017-11-09 | Disposition: A | Discharge status: Home / Self Care | Payer: BLUE CROSS/BLUE SHIELD | Source: Ambulatory Visit | Attending: Oncology | Admitting: Oncology

## 2017-11-09 ENCOUNTER — Emergency Department (HOSPITAL_COMMUNITY)
Admission: EM | Admit: 2017-11-09 | Discharge: 2017-11-09 | Disposition: A | Discharge status: Home / Self Care | Payer: BLUE CROSS/BLUE SHIELD | Attending: Emergency Medicine | Admitting: Emergency Medicine

## 2017-11-09 ENCOUNTER — Inpatient Hospital Stay: Payer: BLUE CROSS/BLUE SHIELD | Admitting: Nurse Practitioner

## 2017-11-09 DIAGNOSIS — C250 Malignant neoplasm of head of pancreas: Secondary | ICD-10-CM

## 2017-11-09 DIAGNOSIS — Z79899 Other long term (current) drug therapy: Secondary | ICD-10-CM | POA: Insufficient documentation

## 2017-11-09 DIAGNOSIS — I471 Supraventricular tachycardia: Secondary | ICD-10-CM

## 2017-11-09 DIAGNOSIS — Z5111 Encounter for antineoplastic chemotherapy: Secondary | ICD-10-CM | POA: Diagnosis not present

## 2017-11-09 DIAGNOSIS — I10 Essential (primary) hypertension: Secondary | ICD-10-CM | POA: Diagnosis not present

## 2017-11-09 DIAGNOSIS — R42 Dizziness and giddiness: Secondary | ICD-10-CM | POA: Diagnosis present

## 2017-11-09 DIAGNOSIS — E119 Type 2 diabetes mellitus without complications: Secondary | ICD-10-CM | POA: Insufficient documentation

## 2017-11-09 DIAGNOSIS — Z85528 Personal history of other malignant neoplasm of kidney: Secondary | ICD-10-CM | POA: Diagnosis not present

## 2017-11-09 DIAGNOSIS — Z8507 Personal history of malignant neoplasm of pancreas: Secondary | ICD-10-CM | POA: Diagnosis not present

## 2017-11-09 DIAGNOSIS — C259 Malignant neoplasm of pancreas, unspecified: Secondary | ICD-10-CM | POA: Diagnosis not present

## 2017-11-09 DIAGNOSIS — C799 Secondary malignant neoplasm of unspecified site: Secondary | ICD-10-CM

## 2017-11-09 DIAGNOSIS — J9811 Atelectasis: Secondary | ICD-10-CM | POA: Diagnosis not present

## 2017-11-09 LAB — CBC WITH DIFFERENTIAL/PLATELET
Basophils Absolute: 0 10*3/uL (ref 0.0–0.1)
Basophils Relative: 0 %
EOS ABS: 0.3 10*3/uL (ref 0.0–0.7)
Eosinophils Relative: 2 %
HCT: 30.8 % — ABNORMAL LOW (ref 39.0–52.0)
Hemoglobin: 9.7 g/dL — ABNORMAL LOW (ref 13.0–17.0)
LYMPHS ABS: 2.2 10*3/uL (ref 0.7–4.0)
Lymphocytes Relative: 13 %
MCH: 28.4 pg (ref 26.0–34.0)
MCHC: 31.5 g/dL (ref 30.0–36.0)
MCV: 90.3 fL (ref 78.0–100.0)
Monocytes Absolute: 1.7 10*3/uL — ABNORMAL HIGH (ref 0.1–1.0)
Monocytes Relative: 10 %
Neutro Abs: 12.5 10*3/uL — ABNORMAL HIGH (ref 1.7–7.7)
Neutrophils Relative %: 75 %
Platelets: 302 10*3/uL (ref 150–400)
RBC: 3.41 MIL/uL — AB (ref 4.22–5.81)
RDW: 15 % (ref 11.5–15.5)
WBC: 16.6 10*3/uL — AB (ref 4.0–10.5)

## 2017-11-09 LAB — BASIC METABOLIC PANEL
Anion gap: 11 (ref 5–15)
BUN: 16 mg/dL (ref 8–23)
CHLORIDE: 106 mmol/L (ref 98–111)
CO2: 22 mmol/L (ref 22–32)
Calcium: 8.3 mg/dL — ABNORMAL LOW (ref 8.9–10.3)
Creatinine, Ser: 1.27 mg/dL — ABNORMAL HIGH (ref 0.61–1.24)
GFR calc Af Amer: >60 mL/min (ref >60)
GFR calc non Af Amer: 59 mL/min — ABNORMAL LOW (ref >60)
Glucose, Bld: 139 mg/dL — ABNORMAL HIGH (ref 70–99)
POTASSIUM: 3.7 mmol/L (ref 3.5–5.1)
SODIUM: 139 mmol/L (ref 135–145)

## 2017-11-09 LAB — CBG MONITORING, ED: Glucose-Capillary: 119 mg/dL — ABNORMAL HIGH (ref 70–99)

## 2017-11-09 LAB — TSH: TSH: 1.138 u[IU]/mL (ref 0.350–4.500)

## 2017-11-09 MED ORDER — LIDOCAINE HCL 1 % IJ SOLN
INTRAMUSCULAR | Status: AC
  Filled 2017-11-09: qty 20

## 2017-11-09 MED ORDER — ADENOSINE 6 MG/2ML IV SOLN
INTRAVENOUS | Status: AC
  Administered 2017-11-09: 6 mg
  Filled 2017-11-09: qty 6

## 2017-11-09 MED ORDER — HYDROMORPHONE HCL 2 MG/ML IJ SOLN
2.0000 mg | Freq: Once | INTRAMUSCULAR | Status: AC
  Administered 2017-11-09: 2 mg via INTRAVENOUS
  Filled 2017-11-09: qty 1

## 2017-11-09 MED ORDER — TECHNETIUM TO 99M ALBUMIN AGGREGATED
4.3000 | Freq: Once | INTRAVENOUS | Status: AC | PRN
  Administered 2017-11-09: 4.3 via INTRAVENOUS

## 2017-11-09 MED ORDER — METOCLOPRAMIDE HCL 5 MG/ML IJ SOLN
5.0000 mg | Freq: Once | INTRAMUSCULAR | Status: AC
  Administered 2017-11-09: 5 mg via INTRAVENOUS
  Filled 2017-11-09: qty 2

## 2017-11-09 MED ORDER — ONDANSETRON HCL 4 MG/2ML IJ SOLN
4.0000 mg | Freq: Once | INTRAMUSCULAR | Status: AC
  Administered 2017-11-09: 4 mg via INTRAVENOUS
  Filled 2017-11-09: qty 2

## 2017-11-09 MED ORDER — TECHNETIUM TC 99M DIETHYLENETRIAME-PENTAACETIC ACID
29.6000 | Freq: Once | INTRAVENOUS | Status: AC | PRN
  Administered 2017-11-09: 29.6 via RESPIRATORY_TRACT

## 2017-11-09 MED ORDER — IOPAMIDOL (ISOVUE-370) INJECTION 76%
INTRAVENOUS | Status: AC
  Filled 2017-11-09: qty 100

## 2017-11-09 MED ORDER — IOPAMIDOL (ISOVUE-370) INJECTION 76%
100.0000 mL | Freq: Once | INTRAVENOUS | Status: AC | PRN
  Administered 2017-11-09: 100 mL via INTRAVENOUS

## 2017-11-09 NOTE — ED Notes (Signed)
Pt c/o of nausea

## 2017-11-09 NOTE — ED Notes (Addendum)
Pt arrives with tachycardia after PICC insertion, pale and weak, states he does not feel right. Assisted to bed, placed on 02 sat HR 189, Monitor and EKG with SVT. Picc line x ray for placement, since placed this am. Verified by Dr Zenia Resides. 2  L NS given as pressure via PICC line. Pt given 80m Adenosine with conversion to SR, followed up with EKG. Pt feeling much better, continue to monitor

## 2017-11-09 NOTE — ED Provider Notes (Signed)
[facility] DEPT Provider Note   CSN: [id number] Arrival date & time: 11/09/17  1696     History   Chief Complaint Chief Complaint  Patient presents with  . Tachycardia    HPI Zymire Turnbo is a 62 y.o. male.  62 year old male presents with acute onset of increased heart rate just prior to arrival.  Patient had a PICC line placed into his right arm today.  States that he became dizzy and lightheaded.  Denied any chest pain or being short of breath.  Patient recently diagnosed with pancreatic cancer but has not started chemotherapy yet.  Denies any recent history of blood loss.  Was diagnosed recently with DVT and is on blood thinners for that.     Past Medical History:  Diagnosis Date  . Anemia    mild  . 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 Diabetes mellitus without complication ([place])    type2  . GERD (gastroesophageal reflux disease)   . Hypertension   . Inguinal hernia    Bilateral age 39  . Leg muscle spasm    both legs occ  . OSA (obstructive sleep apnea)    Wears CPAP at night  . Pneumonia    history of x3  . Renal cancer, right ([place])   . 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 Renal mass, right   . Status post dilation of esophageal narrowing   . Umbilical hernia    Small noted on CT 05/26/2017  . Ventral hernia    noted on CT 05/26/2017    Patient Active Problem List   Diagnosis Date Noted  . Primary cancer of head of pancreas ([place]) 11/04/2017  . Goals of care, counseling/discussion 11/04/2017  . Acute calculous cholecystitis s/p lap cholecystectomy 10/03/2017 09/30/2017  . Acute cholangitis 09/30/2017  . Abdominal pain, generalized 09/25/2017  . Obstructive jaundice 09/20/2017  . Hypodense mass of liver 09/20/2017  . DM2 (diabetes mellitus, type 2) ([place]) 09/20/2017  . HTN (hypertension) 09/20/2017  . Urinary  retention due to benign prostatic hyperplasia 09/08/2017  . Renal cancer, right ([place]) 07/28/2017  . Aortic dissection ([place]) 03/06/2017  . OSA (obstructive sleep apnea) 07/15/2010    Past Surgical History:  Procedure Laterality Date  . BILIARY STENT PLACEMENT  09/21/2017   Procedure: BILIARY STENT PLACEMENT;  Surgeon: Clarene Essex, MD;  Location: [facility] ENDOSCOPY;  Service: Endoscopy;;  . BILIARY STENT PLACEMENT  09/23/2017   Procedure: BILIARY STENT PLACEMENT;  Surgeon: Clarene Essex, MD;  Location: [facility];  Service: Endoscopy;;  . BRONCHIAL BRUSHINGS  09/21/2017   Procedure: BILIARY BRUSHINGS;  Surgeon: Clarene Essex, MD;  Location: [facility];  Service: Endoscopy;;  . CHOLECYSTECTOMY N/A 10/03/2017   Procedure: LAPAROSCOPIC CHOLECYSTECTOMY;  Surgeon: Excell Seltzer, MD;  Location: [facility];  Service: General;  Laterality: N/A;  . COLONOSCOPY    . ERCP N/A 09/21/2017   Procedure: ENDOSCOPIC RETROGRADE CHOLANGIOPANCREATOGRAPHY (ERCP);  Surgeon: Clarene Essex, MD;  Location: [facility];  Service: Endoscopy;  Laterality: N/A;  . ERCP N/A 09/23/2017   Procedure: ENDOSCOPIC RETROGRADE CHOLANGIOPANCREATOGRAPHY (ERCP);  Surgeon: Clarene Essex, MD;  Location: [facility];  Service: Endoscopy;  Laterality: N/A;  . HERNIA REPAIR Bilateral    AGE 57  . ROBOT ASSISTED LAPAROSCOPIC NEPHRECTOMY Right 07/28/2017   Procedure: XI ROBOTIC ASSISTED LAPAROSCOPIC PARTIAL NEPHRECTOMY;  Surgeon: Raynelle Bring, MD;  Location: [facility];  Service:  Urology;  Laterality: Right;  . SPHINCTEROTOMY  09/21/2017   Procedure: SPHINCTEROTOMY;  Surgeon: Clarene Essex, MD;  Location: [facility] ENDOSCOPY;  Service: Endoscopy;;  . Bess Kinds CHOLANGIOSCOPY N/A 09/23/2017   Procedure: ZOXWRUEA CHOLANGIOSCOPY;  Surgeon: Clarene Essex, MD;  Location: [facility];  Service: Endoscopy;  Laterality: N/A;  . TONSILLECTOMY     age 33  . TRANSURETHRAL RESECTION OF PROSTATE    . TRANSURETHRAL RESECTION OF PROSTATE N/A 09/08/2017   Procedure: TRANSURETHRAL  RESECTION OF THE PROSTATE (TURP);  Surgeon: Irine Seal, MD;  Location: [facility];  Service: Urology;  Laterality: N/A;  . UPPER GASTROINTESTINAL ENDOSCOPY          Home Medications    Prior to Admission medications   Medication Sig Start Date End Date Taking? Authorizing Provider  amLODipine (NORVASC) 10 MG tablet Take 10 mg by mouth daily.    [provider]  diphenhydrAMINE (BENADRYL) 25 mg capsule Take 1 capsule (25 mg total) by mouth every 6 (six) hours as needed for itching. 10/14/17   Nita Sells, MD  famotidine (PEPCID) 20 MG tablet Take 1 tablet (20 mg total) by mouth daily. 10/15/17   Nita Sells, MD  feeding supplement, ENSURE ENLIVE, (ENSURE ENLIVE) LIQD Take 237 mLs by mouth 3 (three) times daily with meals. 10/14/17   Nita Sells, MD  gabapentin (NEURONTIN) 300 MG capsule Take 300 mg by mouth at bedtime.    [provider]  hydrALAZINE (APRESOLINE) 100 MG tablet Take 100 mg by mouth 3 (three) times daily.    [provider]  HYDROmorphone (DILAUDID) 4 MG tablet Take 1 tablet (4 mg total) by mouth every 4 (four) hours as needed for severe pain. 10/28/17   Owens Shark, NP  labetalol (NORMODYNE) 200 MG tablet Take 400 mg by mouth 3 (three) times daily.     [provider]  methocarbamol (ROBAXIN) 500 MG tablet Take 2 tablets (1,000 mg total) by mouth 4 (four) times daily. 10/14/17   Nita Sells, MD  metoCLOPramide (REGLAN) 10 MG tablet Take 1 tablet (10 mg total) by mouth 3 (three) times daily before meals. 10/28/17   Owens Shark, NP  morphine (MS CONTIN) 15 MG 12 hr tablet Take 1 tablet (15 mg total) by mouth every 12 (twelve) hours. 10/20/17   Owens Shark, NP  neomycin-polymyxin-dexameth (MAXITROL) 0.1 % OINT Place 1 application into both eyes daily as needed (eye irritation).    [provider]  ondansetron (ZOFRAN) 4 MG tablet Take 1 tablet (4 mg total) by mouth 2 (two) times daily as needed. 11/03/17    Ladell Pier, MD  pantoprazole (PROTONIX) 20 MG tablet Take 20 mg by mouth daily.  07/11/16   [provider]  pioglitazone (ACTOS) 15 MG tablet Take 15 mg by mouth daily.    [provider]  polyethylene glycol (MIRALAX / GLYCOLAX) packet Take 17 g by mouth daily as needed for mild constipation or moderate constipation. 09/26/17   Samuella Cota, MD  Rivaroxaban 15 & 20 MG TBPK Start with one 20m tablet by mouth twice a day with food. On Day 22, switch to one 25mtablet once a day with food. 10/28/17   ThOwens SharkNP  sitaGLIPtin (JANUVIA) 100 MG tablet Take 100 mg by mouth daily.    [provider]    Family History Family History  Problem Relation Age of Onset  . Allergies Mother   . Diabetes Mother   . Stroke Mother   .  Skin cancer Mother   . Allergies Sister   . Asthma Daughter        childhood  . Asthma Father        developed a lot of medical problems after a fall  . Colon cancer Neg Hx   . Stomach cancer Neg Hx   . Rectal cancer Neg Hx   . Liver cancer Neg Hx   . Esophageal cancer Neg Hx     Social History Social History   Tobacco Use  . Smoking status: Never Smoker  . Smokeless tobacco: Never Used  Substance Use Topics  . Alcohol use: No  . Drug use: No     Allergies   Ciprofloxacin; Codeine; and Shellfish-derived products   Review of Systems Review of Systems  All other systems reviewed and are negative.    Physical Exam Updated Vital Signs BP (!) 76/45   Pulse (!) 182   Resp (!) 21   SpO2 96%   Physical Exam  Constitutional: He is oriented to person, place, and time. He appears well-developed and well-nourished.  Non-toxic appearance. No distress.  HENT:  Head: Normocephalic and atraumatic.  Eyes: Pupils are equal, round, and reactive to light. Conjunctivae, EOM and lids are normal.  Neck: Normal range of motion. Neck supple. No tracheal deviation present. No thyroid mass present.  Cardiovascular: Regular  rhythm and normal heart sounds. Tachycardia present. Exam reveals no gallop.  No murmur heard. Pulmonary/Chest: Effort normal and breath sounds normal. No stridor. No respiratory distress. He has no decreased breath sounds. He has no wheezes. He has no rhonchi. He has no rales.  Abdominal: Soft. Normal appearance and bowel sounds are normal. He exhibits no distension. There is no tenderness. There is no rebound and no CVA tenderness.  Musculoskeletal: Normal range of motion. He exhibits no edema or tenderness.  Neurological: He is alert and oriented to person, place, and time. He has normal strength. No cranial nerve deficit or sensory deficit. GCS eye subscore is 4. GCS verbal subscore is 5. GCS motor subscore is 6.  Skin: Skin is warm and dry. No abrasion and no rash noted.  Psychiatric: He has a normal mood and affect. His speech is normal and behavior is normal.  Nursing note and vitals reviewed.    ED Treatments / Results  Labs (all labs ordered are listed, but only abnormal results are displayed) Labs Reviewed  CBG MONITORING, ED - Abnormal; Notable for the following components:      Result Value   Glucose-Capillary 119 (*)    All other components within normal limits  CBC WITH DIFFERENTIAL/PLATELET  BASIC METABOLIC PANEL  TSH    EKG None  Radiology Ir Picc Placement Right >5 Yrs Inc Img Guide  Result Date: 11/09/2017 INDICATION: PANCREAS CA, ACCESS FOR CHEMOTHERAPY EXAM: ULTRASOUND AND FLUOROSCOPIC GUIDED PICC LINE INSERTION MEDICATIONS: 1% LIDOCAINE LOCAL CONTRAST:  None FLUOROSCOPY TIME:  TWELVE seconds (2 mGy) COMPLICATIONS: NONE TECHNIQUE: The procedure, risks, benefits, and alternatives were explained to the patient and informed written consent was obtained. A timeout was performed prior to the initiation of the procedure. The right upper extremity was prepped with chlorhexidine in a sterile fashion, and a sterile drape was applied covering the operative field. Maximum  barrier sterile technique with sterile gowns and gloves were used for the procedure. A timeout was performed prior to the initiation of the procedure. Local anesthesia was provided with 1% lidocaine. Under direct ultrasound guidance, the right basilic vein was accessed with a  micropuncture kit after the overlying soft tissues were anesthetized with 1% lidocaine. An ultrasound image was saved for documentation purposes. A guidewire was advanced to the level of the superior caval-atrial junction for measurement purposes and the PICC line was cut to length. A peel-away sheath was placed and a 39 cm, 5 French, SINGLE lumen was inserted to level of the superior caval-atrial junction. A post procedure spot fluoroscopic was obtained. The catheter easily aspirated and flushed and was sutured in place. A dressing was placed. The patient tolerated the procedure well without immediate post procedural complication. FINDINGS: After catheter placement, the tip lies within the superior cavoatrial junction. The catheter aspirates and flushes normally and is ready for immediate use. IMPRESSION: Successful ultrasound and fluoroscopic guided placement of a right basilic vein approach, 39 cm, 5 French, SINGLE lumen PICC with tip at the superior caval-atrial junction. The PICC line is ready for immediate use. Electronically Signed   By: Jerilynn Mages.  Shick M.D.   On: 11/09/2017 08:56    Procedures Procedures (including critical care time)  Medications Ordered in ED Medications  adenosine (ADENOCARD) 6 MG/2ML injection (has no administration in time range)     Initial Impression / Assessment and Plan / ED Course  I have reviewed the triage vital signs and the nursing notes.  Pertinent labs & imaging results that were available during my care of the patient were reviewed by me and considered in my medical decision making (see chart for details).     The patient presented in SVT with hypotension.  Was given IV fluid bolus and  subsequently 6 mg of Adenocard.  Patient reverted to a sinus rhythm.  Patient recently diagnosed with DVT and unable to have chest CT based on recommendations from his nephrologist about contrast dye.  VQ scan is pending at this time.  He has remained in sinus rhythm.  Case is signed out to Dr. Sherry Ruffing   CRITICAL CARE Performed by: Leota Jacobsen Total critical care time: 60 minutes Critical care time was exclusive of separately billable procedures and treating other patients. Critical care was necessary to treat or prevent imminent or life-threatening deterioration. Critical care was time spent personally by me on the following activities: development of treatment plan with patient and/or surrogate as well as nursing, discussions with consultants, evaluation of patient's response to treatment, examination of patient, obtaining history from patient or surrogate, ordering and performing treatments and interventions, ordering and review of laboratory studies, ordering and review of radiographic studies, pulse oximetry and re-evaluation of patient's condition.  Final Clinical Impressions(s) / ED Diagnoses   Final diagnoses:  None    ED Discharge Orders    None       Lacretia Leigh, MD 11/09/17 1549

## 2017-11-09 NOTE — ED Triage Notes (Signed)
Pt arrived via w/c from cancer center after having a PICC line placed this am. HR 192 upon arrival from cancer center.

## 2017-11-09 NOTE — ED Notes (Signed)
Pt BP elevated made MD aware.

## 2017-11-09 NOTE — ED Provider Notes (Signed)
3:55 PM Care assumed from Dr. Zenia Resides.  At time of transfer care, patient is awaiting VQ scan to look for pulmonary embolism given his prior DVT and new SVT.  If VQ scan is negative, plan to discharge patient to follow-up with outpatient care team.  Anticipate reassessment after imaging returns.  VQ scan was negative.  Patient will be discharged home for outpatient follow-up.  Patient and family agreed with plan of care and patient was discharged in good condition.  Clinical Impression: 1. SVT (supraventricular tachycardia) (HCC)     Disposition: Discharge  Condition: Good  I have discussed the results, Dx and Tx plan with the pt(& family if present). He/she/they expressed understanding and agree(s) with the plan. Discharge instructions discussed at great length. Strict return precautions discussed and pt &/or family have verbalized understanding of the instructions. No further questions at time of discharge.    Discharge Medication List as of 11/09/2017  5:27 PM      Follow Up: [name], MD [place] [address] [phone]     [facility]ENC[facility]PT [place] [id number] [address]       Inga Noller, Gwenyth Allegra, MD 11/10/17 [phone]

## 2017-11-09 NOTE — Discharge Instructions (Signed)
Your work-up today revealed an episode of supraventricular tachycardia.  The medicines they were able to give you got your back into a sinus rhythm.  We also ruled out blood clot today.  Please follow-up with your primary doctor in the next several days.  If any symptoms change or worsen, please return to the nearest emergency department.

## 2017-11-09 NOTE — Procedures (Signed)
panc ca  S/p RUE SL POWER PICC  Tip svcra No comp Stable ebl 0 Full report in pacs

## 2017-11-10 ENCOUNTER — Other Ambulatory Visit (HOSPITAL_COMMUNITY): Payer: BLUE CROSS/BLUE SHIELD

## 2017-11-10 ENCOUNTER — Ambulatory Visit (HOSPITAL_COMMUNITY): Payer: BLUE CROSS/BLUE SHIELD

## 2017-11-11 ENCOUNTER — Other Ambulatory Visit: Payer: Self-pay | Admitting: *Deleted

## 2017-11-11 ENCOUNTER — Telehealth: Payer: Self-pay | Admitting: *Deleted

## 2017-11-11 DIAGNOSIS — C250 Malignant neoplasm of head of pancreas: Secondary | ICD-10-CM

## 2017-11-11 NOTE — Telephone Encounter (Signed)
Spoke to daughter, Mickel Baas. Confirmed schedule for Tuesday. Patient will arrive at [facility] at 745 for visit with Dr. [name] and infusion scheduled for 8:30. Daughter reports patient is doing really well since ED visit on Wednesday. He has increased energy and is doing very well.

## 2017-11-11 NOTE — Telephone Encounter (Signed)
Per Dr. [name],  Repeat  CMET on 9/3 before chemo; however, OK to give pre meds while waiting for CMET results.

## 2017-11-11 NOTE — Telephone Encounter (Signed)
Per Dr. [name] ok to use labs from 8/28 for treatment on 9/3

## 2017-11-13 ENCOUNTER — Other Ambulatory Visit: Payer: Self-pay | Admitting: Oncology

## 2017-11-15 ENCOUNTER — Inpatient Hospital Stay: Payer: BLUE CROSS/BLUE SHIELD

## 2017-11-15 ENCOUNTER — Inpatient Hospital Stay: Payer: BLUE CROSS/BLUE SHIELD | Attending: Nurse Practitioner

## 2017-11-15 ENCOUNTER — Encounter: Payer: Self-pay | Admitting: Oncology

## 2017-11-15 ENCOUNTER — Inpatient Hospital Stay (HOSPITAL_BASED_OUTPATIENT_CLINIC_OR_DEPARTMENT_OTHER): Payer: BLUE CROSS/BLUE SHIELD | Admitting: Oncology

## 2017-11-15 VITALS — BP 135/75 | HR 95 | Temp 98.9°F | Resp 18 | Ht 67.0 in | Wt 168.4 lb

## 2017-11-15 DIAGNOSIS — Z5111 Encounter for antineoplastic chemotherapy: Secondary | ICD-10-CM | POA: Insufficient documentation

## 2017-11-15 DIAGNOSIS — I82401 Acute embolism and thrombosis of unspecified deep veins of right lower extremity: Secondary | ICD-10-CM | POA: Insufficient documentation

## 2017-11-15 DIAGNOSIS — Z23 Encounter for immunization: Secondary | ICD-10-CM | POA: Insufficient documentation

## 2017-11-15 DIAGNOSIS — C787 Secondary malignant neoplasm of liver and intrahepatic bile duct: Secondary | ICD-10-CM | POA: Diagnosis not present

## 2017-11-15 DIAGNOSIS — Z7901 Long term (current) use of anticoagulants: Secondary | ICD-10-CM | POA: Insufficient documentation

## 2017-11-15 DIAGNOSIS — C250 Malignant neoplasm of head of pancreas: Secondary | ICD-10-CM

## 2017-11-15 DIAGNOSIS — Z452 Encounter for adjustment and management of vascular access device: Secondary | ICD-10-CM | POA: Insufficient documentation

## 2017-11-15 DIAGNOSIS — D649 Anemia, unspecified: Secondary | ICD-10-CM

## 2017-11-15 DIAGNOSIS — C259 Malignant neoplasm of pancreas, unspecified: Secondary | ICD-10-CM | POA: Insufficient documentation

## 2017-11-15 DIAGNOSIS — E119 Type 2 diabetes mellitus without complications: Secondary | ICD-10-CM

## 2017-11-15 LAB — CMP (CANCER CENTER ONLY)
ALBUMIN: 2.1 g/dL — AB (ref 3.5–5.0)
ALK PHOS: 291 U/L — AB (ref 38–126)
ALT: 16 U/L (ref 0–44)
AST: 35 U/L (ref 15–41)
Anion gap: 10 (ref 5–15)
BILIRUBIN TOTAL: 1.1 mg/dL (ref 0.3–1.2)
BUN: 20 mg/dL (ref 8–23)
CALCIUM: 8.2 mg/dL — AB (ref 8.9–10.3)
CO2: 23 mmol/L (ref 22–32)
CREATININE: 1.12 mg/dL (ref 0.61–1.24)
Chloride: 100 mmol/L (ref 98–111)
GFR, Estimated: >60 mL/min (ref >60)
Glucose, Bld: 90 mg/dL (ref 70–99)
Potassium: 4.2 mmol/L (ref 3.5–5.1)
SODIUM: 133 mmol/L — AB (ref 135–145)
TOTAL PROTEIN: 6 g/dL — AB (ref 6.5–8.1)

## 2017-11-15 MED ORDER — OXALIPLATIN CHEMO INJECTION 100 MG/20ML
81.0000 mg/m2 | Freq: Once | INTRAVENOUS | Status: AC
  Administered 2017-11-15: 150 mg via INTRAVENOUS
  Filled 2017-11-15: qty 10

## 2017-11-15 MED ORDER — DEXTROSE 5 % IV SOLN
Freq: Once | INTRAVENOUS | Status: DC
  Filled 2017-11-15: qty 250

## 2017-11-15 MED ORDER — PALONOSETRON HCL INJECTION 0.25 MG/5ML
INTRAVENOUS | Status: AC
  Filled 2017-11-15: qty 5

## 2017-11-15 MED ORDER — PALONOSETRON HCL INJECTION 0.25 MG/5ML
0.2500 mg | Freq: Once | INTRAVENOUS | Status: AC
  Administered 2017-11-15: 0.25 mg via INTRAVENOUS

## 2017-11-15 MED ORDER — LEUCOVORIN CALCIUM INJECTION 350 MG
400.0000 mg/m2 | Freq: Once | INTRAVENOUS | Status: AC
  Administered 2017-11-15: 748 mg via INTRAVENOUS
  Filled 2017-11-15: qty 37.4

## 2017-11-15 MED ORDER — DEXTROSE 5 % IV SOLN
Freq: Once | INTRAVENOUS | Status: AC
  Administered 2017-11-15: 10:00:00 via INTRAVENOUS
  Filled 2017-11-15: qty 250

## 2017-11-15 MED ORDER — DEXAMETHASONE SODIUM PHOSPHATE 10 MG/ML IJ SOLN
10.0000 mg | Freq: Once | INTRAMUSCULAR | Status: AC
  Administered 2017-11-15: 10 mg via INTRAVENOUS

## 2017-11-15 MED ORDER — SODIUM CHLORIDE 0.9 % IV SOLN
2400.0000 mg/m2 | INTRAVENOUS | Status: DC
  Administered 2017-11-15: 4500 mg via INTRAVENOUS
  Filled 2017-11-15: qty 90

## 2017-11-15 MED ORDER — DEXAMETHASONE SODIUM PHOSPHATE 10 MG/ML IJ SOLN
INTRAMUSCULAR | Status: AC
  Filled 2017-11-15: qty 1

## 2017-11-15 NOTE — Patient Instructions (Addendum)
[facility] Discharge Instructions for Patients Receiving Chemotherapy  Today you received the following chemotherapy agents:  Oxaliplatin, Leucovorin, and 5FU.  To help prevent nausea and vomiting after your treatment, we encourage you to take your nausea medication as directed.   If you develop nausea and vomiting that is not controlled by your nausea medication, call the clinic.   BELOW ARE SYMPTOMS THAT SHOULD BE REPORTED IMMEDIATELY:  *FEVER GREATER THAN 100.5 F  *CHILLS WITH OR WITHOUT FEVER  NAUSEA AND VOMITING THAT IS NOT CONTROLLED WITH YOUR NAUSEA MEDICATION  *UNUSUAL SHORTNESS OF BREATH  *UNUSUAL BRUISING OR BLEEDING  TENDERNESS IN MOUTH AND THROAT WITH OR WITHOUT PRESENCE OF ULCERS  *URINARY PROBLEMS  *BOWEL PROBLEMS  UNUSUAL RASH Items with * indicate a potential emergency and should be followed up as soon as possible.  Feel free to call the clinic should you have any questions or concerns. The clinic phone number is [phone].  Please show the [facility] at check-in to the Emergency Department and triage nurse.  Oxaliplatin Injection What is this medicine? OXALIPLATIN (ox AL i PLA tin) is a chemotherapy drug. It targets fast dividing cells, like cancer cells, and causes these cells to die. This medicine is used to treat cancers of the colon and rectum, and many other cancers. This medicine may be used for other purposes; ask your health care provider or pharmacist if you have questions. COMMON BRAND NAME(S): Eloxatin What should I tell my health care provider before I take this medicine? They need to know if you have any of these conditions: -kidney disease -an unusual or allergic reaction to oxaliplatin, other chemotherapy, other medicines, foods, dyes, or preservatives -pregnant or trying to get pregnant -breast-feeding How should I use this medicine? This drug is given as an infusion into a vein. It is administered in a hospital  or clinic by a specially trained health care professional. Talk to your pediatrician regarding the use of this medicine in children. Special care may be needed. Overdosage: If you think you have taken too much of this medicine contact a poison control center or emergency room at once. NOTE: This medicine is only for you. Do not share this medicine with others. What if I miss a dose? It is important not to miss a dose. Call your doctor or health care professional if you are unable to keep an appointment. What may interact with this medicine? -medicines to increase blood counts like filgrastim, pegfilgrastim, sargramostim -probenecid -some antibiotics like amikacin, gentamicin, neomycin, polymyxin B, streptomycin, tobramycin -zalcitabine Talk to your doctor or health care professional before taking any of these medicines: -acetaminophen -aspirin -ibuprofen -ketoprofen -naproxen This list may not describe all possible interactions. Give your health care provider a list of all the medicines, herbs, non-prescription drugs, or dietary supplements you use. Also tell them if you smoke, drink alcohol, or use illegal drugs. Some items may interact with your medicine. What should I watch for while using this medicine? Your condition will be monitored carefully while you are receiving this medicine. You will need important blood work done while you are taking this medicine. This medicine can make you more sensitive to cold. Do not drink cold drinks or use ice. Cover exposed skin before coming in contact with cold temperatures or cold objects. When out in cold weather wear warm clothing and cover your mouth and nose to warm the air that goes into your lungs. Tell your doctor if you get sensitive to the  cold. This drug may make you feel generally unwell. This is not uncommon, as chemotherapy can affect healthy cells as well as cancer cells. Report any side effects. Continue your course of treatment even  though you feel ill unless your doctor tells you to stop. In some cases, you may be given additional medicines to help with side effects. Follow all directions for their use. Call your doctor or health care professional for advice if you get a fever, chills or sore throat, or other symptoms of a cold or flu. Do not treat yourself. This drug decreases your body's ability to fight infections. Try to avoid being around people who are sick. This medicine may increase your risk to bruise or bleed. Call your doctor or health care professional if you notice any unusual bleeding. Be careful brushing and flossing your teeth or using a toothpick because you may get an infection or bleed more easily. If you have any dental work done, tell your dentist you are receiving this medicine. Avoid taking products that contain aspirin, acetaminophen, ibuprofen, naproxen, or ketoprofen unless instructed by your doctor. These medicines may hide a fever. Do not become pregnant while taking this medicine. Women should inform their doctor if they wish to become pregnant or think they might be pregnant. There is a potential for serious side effects to an unborn child. Talk to your health care professional or pharmacist for more information. Do not breast-feed an infant while taking this medicine. Call your doctor or health care professional if you get diarrhea. Do not treat yourself. What side effects may I notice from receiving this medicine? Side effects that you should report to your doctor or health care professional as soon as possible: -allergic reactions like skin rash, itching or hives, swelling of the face, lips, or tongue -low blood counts - This drug may decrease the number of white blood cells, red blood cells and platelets. You may be at increased risk for infections and bleeding. -signs of infection - fever or chills, cough, sore throat, pain or difficulty passing urine -signs of decreased platelets or bleeding -  bruising, pinpoint red spots on the skin, black, tarry stools, nosebleeds -signs of decreased red blood cells - unusually weak or tired, fainting spells, lightheadedness -breathing problems -chest pain, pressure -cough -diarrhea -jaw tightness -mouth sores -nausea and vomiting -pain, swelling, redness or irritation at the injection site -pain, tingling, numbness in the hands or feet -problems with balance, talking, walking -redness, blistering, peeling or loosening of the skin, including inside the mouth -trouble passing urine or change in the amount of urine Side effects that usually do not require medical attention (report to your doctor or health care professional if they continue or are bothersome): -changes in vision -constipation -hair loss -loss of appetite -metallic taste in the mouth or changes in taste -stomach pain This list may not describe all possible side effects. Call your doctor for medical advice about side effects. You may report side effects to FDA at 1-800-FDA-1088. Where should I keep my medicine? This drug is given in a hospital or clinic and will not be stored at home. NOTE: This sheet is a summary. It may not cover all possible information. If you have questions about this medicine, talk to your doctor, pharmacist, or health care provider.  2018 Elsevier/Gold Standard (2007-09-26 17:22:47)  Leucovorin injection What is this medicine? LEUCOVORIN (loo koe VOR in) is used to prevent or treat the harmful effects of some medicines. This medicine is used to  treat anemia caused by a low amount of folic acid in the body. It is also used with 5-fluorouracil (5-FU) to treat colon cancer. This medicine may be used for other purposes; ask your health care provider or pharmacist if you have questions. What should I tell my health care provider before I take this medicine? They need to know if you have any of these conditions: -anemia from low levels of vitamin B-12 in  the blood -an unusual or allergic reaction to leucovorin, folic acid, other medicines, foods, dyes, or preservatives -pregnant or trying to get pregnant -breast-feeding How should I use this medicine? This medicine is for injection into a muscle or into a vein. It is given by a health care professional in a hospital or clinic setting. Talk to your pediatrician regarding the use of this medicine in children. Special care may be needed. Overdosage: If you think you have taken too much of this medicine contact a poison control center or emergency room at once. NOTE: This medicine is only for you. Do not share this medicine with others. What if I miss a dose? This does not apply. What may interact with this medicine? -capecitabine -fluorouracil -phenobarbital -phenytoin -primidone -trimethoprim-sulfamethoxazole This list may not describe all possible interactions. Give your health care provider a list of all the medicines, herbs, non-prescription drugs, or dietary supplements you use. Also tell them if you smoke, drink alcohol, or use illegal drugs. Some items may interact with your medicine. What should I watch for while using this medicine? Your condition will be monitored carefully while you are receiving this medicine. This medicine may increase the side effects of 5-fluorouracil, 5-FU. Tell your doctor or health care professional if you have diarrhea or mouth sores that do not get better or that get worse. What side effects may I notice from receiving this medicine? Side effects that you should report to your doctor or health care professional as soon as possible: -allergic reactions like skin rash, itching or hives, swelling of the face, lips, or tongue -breathing problems -fever, infection -mouth sores -unusual bleeding or bruising -unusually weak or tired Side effects that usually do not require medical attention (report to your doctor or health care professional if they continue or  are bothersome): -constipation or diarrhea -loss of appetite -nausea, vomiting This list may not describe all possible side effects. Call your doctor for medical advice about side effects. You may report side effects to FDA at 1-800-FDA-1088. Where should I keep my medicine? This drug is given in a hospital or clinic and will not be stored at home. NOTE: This sheet is a summary. It may not cover all possible information. If you have questions about this medicine, talk to your doctor, pharmacist, or health care provider.  2018 Elsevier/Gold Standard (2007-09-05 16:50:29)  Fluorouracil, 5-FU injection What is this medicine? FLUOROURACIL, 5-FU (flure oh YOOR a sil) is a chemotherapy drug. It slows the growth of cancer cells. This medicine is used to treat many types of cancer like breast cancer, colon or rectal cancer, pancreatic cancer, and stomach cancer. This medicine may be used for other purposes; ask your health care provider or pharmacist if you have questions. COMMON BRAND NAME(S): Adrucil What should I tell my health care provider before I take this medicine? They need to know if you have any of these conditions: -blood disorders -dihydropyrimidine dehydrogenase (DPD) deficiency -infection (especially a virus infection such as chickenpox, cold sores, or herpes) -kidney disease -liver disease -malnourished, poor nutrition -recent  or ongoing radiation therapy -an unusual or allergic reaction to fluorouracil, other chemotherapy, other medicines, foods, dyes, or preservatives -pregnant or trying to get pregnant -breast-feeding How should I use this medicine? This drug is given as an infusion or injection into a vein. It is administered in a hospital or clinic by a specially trained health care professional. Talk to your pediatrician regarding the use of this medicine in children. Special care may be needed. Overdosage: If you think you have taken too much of this medicine contact a  poison control center or emergency room at once. NOTE: This medicine is only for you. Do not share this medicine with others. What if I miss a dose? It is important not to miss your dose. Call your doctor or health care professional if you are unable to keep an appointment. What may interact with this medicine? -allopurinol -cimetidine -dapsone -digoxin -hydroxyurea -leucovorin -levamisole -medicines for seizures like ethotoin, fosphenytoin, phenytoin -medicines to increase blood counts like filgrastim, pegfilgrastim, sargramostim -medicines that treat or prevent blood clots like warfarin, enoxaparin, and dalteparin -methotrexate -metronidazole -pyrimethamine -some other chemotherapy drugs like busulfan, cisplatin, estramustine, vinblastine -trimethoprim -trimetrexate -vaccines Talk to your doctor or health care professional before taking any of these medicines: -acetaminophen -aspirin -ibuprofen -ketoprofen -naproxen This list may not describe all possible interactions. Give your health care provider a list of all the medicines, herbs, non-prescription drugs, or dietary supplements you use. Also tell them if you smoke, drink alcohol, or use illegal drugs. Some items may interact with your medicine. What should I watch for while using this medicine? Visit your doctor for checks on your progress. This drug may make you feel generally unwell. This is not uncommon, as chemotherapy can affect healthy cells as well as cancer cells. Report any side effects. Continue your course of treatment even though you feel ill unless your doctor tells you to stop. In some cases, you may be given additional medicines to help with side effects. Follow all directions for their use. Call your doctor or health care professional for advice if you get a fever, chills or sore throat, or other symptoms of a cold or flu. Do not treat yourself. This drug decreases your body's ability to fight infections. Try to  avoid being around people who are sick. This medicine may increase your risk to bruise or bleed. Call your doctor or health care professional if you notice any unusual bleeding. Be careful brushing and flossing your teeth or using a toothpick because you may get an infection or bleed more easily. If you have any dental work done, tell your dentist you are receiving this medicine. Avoid taking products that contain aspirin, acetaminophen, ibuprofen, naproxen, or ketoprofen unless instructed by your doctor. These medicines may hide a fever. Do not become pregnant while taking this medicine. Women should inform their doctor if they wish to become pregnant or think they might be pregnant. There is a potential for serious side effects to an unborn child. Talk to your health care professional or pharmacist for more information. Do not breast-feed an infant while taking this medicine. Men should inform their doctor if they wish to father a child. This medicine may lower sperm counts. Do not treat diarrhea with over the counter products. Contact your doctor if you have diarrhea that lasts more than 2 days or if it is severe and watery. This medicine can make you more sensitive to the sun. Keep out of the sun. If you cannot avoid being in the  sun, wear protective clothing and use sunscreen. Do not use sun lamps or tanning beds/booths. What side effects may I notice from receiving this medicine? Side effects that you should report to your doctor or health care professional as soon as possible: -allergic reactions like skin rash, itching or hives, swelling of the face, lips, or tongue -low blood counts - this medicine may decrease the number of white blood cells, red blood cells and platelets. You may be at increased risk for infections and bleeding. -signs of infection - fever or chills, cough, sore throat, pain or difficulty passing urine -signs of decreased platelets or bleeding - bruising, pinpoint red spots  on the skin, black, tarry stools, blood in the urine -signs of decreased red blood cells - unusually weak or tired, fainting spells, lightheadedness -breathing problems -changes in vision -chest pain -mouth sores -nausea and vomiting -pain, swelling, redness at site where injected -pain, tingling, numbness in the hands or feet -redness, swelling, or sores on hands or feet -stomach pain -unusual bleeding Side effects that usually do not require medical attention (report to your doctor or health care professional if they continue or are bothersome): -changes in finger or toe nails -diarrhea -dry or itchy skin -hair loss -headache -loss of appetite -sensitivity of eyes to the light -stomach upset -unusually teary eyes This list may not describe all possible side effects. Call your doctor for medical advice about side effects. You may report side effects to FDA at 1-800-FDA-1088. Where should I keep my medicine? This drug is given in a hospital or clinic and will not be stored at home. NOTE: This sheet is a summary. It may not cover all possible information. If you have questions about this medicine, talk to your doctor, pharmacist, or health care provider.  2018 Elsevier/Gold Standard (2007-07-05 13:53:16)

## 2017-11-15 NOTE — Progress Notes (Signed)
[facility] OFFICE PROGRESS NOTE   Diagnosis: Pancreas cancer  INTERVAL HISTORY:   Jerry Weiss returns for a scheduled visit.  He underwent PEG placement 11/09/2017.  He was scheduled to begin chemotherapy that day, but he developed tachycardia and hypotension following the procedure.  He was evaluated in the emergency room and treated with adenosine.  He converted to sinus rhythm.  A V/Q scan returned normal.  He was discharged home.  He reports feeling better over the past week.  Pain is under good control with MS Contin and Dilaudid.  He has intermittent nausea and rare emesis.  Nausea improves with Phenergan.  He is maintained on Xarelto anticoagulation for the right lower extremity DVT diagnosed 10/28/2017.  Objective:    Vital signs in last 24 hours:  Blood pressure 135/75, pulse 95, temperature 98.9 F (37.2 C), temperature source Oral, resp. rate 18, height 5' 7"  (1.702 m), weight 168 lb 6.4 oz (76.4 kg), SpO2 100 %.    HEENT: No thrush Resp: Lungs clear bilaterally Cardio: Regular rate and rhythm GI: Tender in the mid abdomen, fullness in the right upper abdomen Vascular: No leg edema    Portacath/PICC-without erythema  Lab Results:  Lab Results  Component Value Date   WBC 16.6 (H) 11/09/2017   HGB 9.7 (L) 11/09/2017   HCT 30.8 (L) 11/09/2017   MCV 90.3 11/09/2017   PLT 302 11/09/2017   NEUTROABS 12.5 (H) 11/09/2017    CMP  Lab Results  Component Value Date   NA 133 (L) 11/15/2017   K 4.2 11/15/2017   CL 100 11/15/2017   CO2 23 11/15/2017   GLUCOSE 90 11/15/2017   BUN 20 11/15/2017   CREATININE 1.12 11/15/2017   CALCIUM 8.2 (L) 11/15/2017   PROT 6.0 (L) 11/15/2017   ALBUMIN 2.1 (L) 11/15/2017   AST 35 11/15/2017   ALT 16 11/15/2017   ALKPHOS 291 (H) 11/15/2017   BILITOT 1.1 11/15/2017   GFRNONAA >60 11/15/2017   GFRAA >60 11/15/2017    Lab Results  Component Value Date   CEA1 5.3 (H) 10/09/2017     Medications: I have  reviewed the patient's current medications.   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 09/22/2017 clinical and radiologic presentation most consistent with pancreas cancer  CT abdomen/pelvis 09/20/2017-new hypodense liver lesions, gallbladder distention with intra-and extrahepatic biliary dilatation  ERCP 09/21/2017 and 09/23/2017, distal common duct stricture, status post placement of ridging, duct stents, cytology from biliary brushing-nondiagnostic  Ultrasound-guided biopsy of a right liver lesion 09/22/2017, metastatic poorly differentiated carcinoma, MSI-stable, tumor mutation burden 5, KRAS G12R, KEIT amplification, PDGFRA amplification  CT angiogram chest, abdomen, and pelvis 09/30/2017-ill-defined soft tissue in the uncinate process, stable type B aortic dissection, distended gallbladder, multiple hepatic metastases, retroperitoneal and periportal adenopathy, infarcted/atrophic left kidney  PET scan 11/03/2017- numerous liver metastases, hypermetabolic mass at the pancreas uncinate and head, increased metabolic activity of the umbilicus, right rectus abdominis, right abdominal pannus  2.Acute cholecystitis, status post a cholecystectomy 10/03/2017  Gallbladder pathology-acute and chronic cholecystitis with cholelithiasis; no evidence of malignancy  Pathology on cystic duct lymph node-metastatic carcinoma;CK7, PAX-8, CD10 and p63 positive;CK 20, GATA-3, CDX-2 and CD117 negative 3.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 Pain-likely secondary to cholecystitisandthe cholecystectomy  versus metastatic  carcinoma;MS Contin 15 mg every 12 hours beginning 10/20/2017, Dilaudid as needed 12.  Right lower extremity DVT 10/28/2017- treated with Xarelto   Disposition: Jerry Weiss appears stable.  I discussed the PET findings with him.  I reviewed the PET images.  He appears to have metastatic pancreas cancer.  He underwent PEG placement complicated by an SVT last week.  He has recovered from this event.  He will continue the current narcotic regimen. The plan is to begin FOLFOX chemotherapy today.  We will add irinotecan after cycle 2 if he is tolerating the chemotherapy well.  He will return for an office visit in the next cycle of FOLFOX in 2 weeks.  30 minutes were spent with the patient today.  The majority of the time was used for counseling and coordination of care.  Betsy Coder, MD  11/15/2017  3:28 PM

## 2017-11-16 ENCOUNTER — Telehealth: Payer: Self-pay | Admitting: Oncology

## 2017-11-16 ENCOUNTER — Other Ambulatory Visit: Payer: Self-pay | Admitting: *Deleted

## 2017-11-16 DIAGNOSIS — L89152 Pressure ulcer of sacral region, stage 2: Secondary | ICD-10-CM | POA: Diagnosis not present

## 2017-11-16 DIAGNOSIS — N261 Atrophy of kidney (terminal): Secondary | ICD-10-CM | POA: Diagnosis not present

## 2017-11-16 DIAGNOSIS — C787 Secondary malignant neoplasm of liver and intrahepatic bile duct: Secondary | ICD-10-CM | POA: Diagnosis not present

## 2017-11-16 DIAGNOSIS — G893 Neoplasm related pain (acute) (chronic): Secondary | ICD-10-CM | POA: Diagnosis not present

## 2017-11-16 DIAGNOSIS — Z4800 Encounter for change or removal of nonsurgical wound dressing: Secondary | ICD-10-CM | POA: Diagnosis not present

## 2017-11-16 NOTE — Telephone Encounter (Signed)
Scheduled appt per 9/3 los - pt to get an updated schedule next visit.

## 2017-11-17 ENCOUNTER — Inpatient Hospital Stay: Payer: BLUE CROSS/BLUE SHIELD

## 2017-11-17 ENCOUNTER — Other Ambulatory Visit: Payer: Self-pay | Admitting: Student

## 2017-11-17 DIAGNOSIS — C259 Malignant neoplasm of pancreas, unspecified: Secondary | ICD-10-CM | POA: Diagnosis not present

## 2017-11-17 DIAGNOSIS — Z7901 Long term (current) use of anticoagulants: Secondary | ICD-10-CM | POA: Diagnosis not present

## 2017-11-17 DIAGNOSIS — D649 Anemia, unspecified: Secondary | ICD-10-CM | POA: Diagnosis not present

## 2017-11-17 DIAGNOSIS — Z23 Encounter for immunization: Secondary | ICD-10-CM | POA: Diagnosis not present

## 2017-11-17 DIAGNOSIS — C787 Secondary malignant neoplasm of liver and intrahepatic bile duct: Secondary | ICD-10-CM | POA: Diagnosis not present

## 2017-11-17 DIAGNOSIS — C250 Malignant neoplasm of head of pancreas: Secondary | ICD-10-CM

## 2017-11-17 DIAGNOSIS — I82401 Acute embolism and thrombosis of unspecified deep veins of right lower extremity: Secondary | ICD-10-CM | POA: Diagnosis not present

## 2017-11-17 DIAGNOSIS — Z5111 Encounter for antineoplastic chemotherapy: Secondary | ICD-10-CM | POA: Diagnosis not present

## 2017-11-17 DIAGNOSIS — Z452 Encounter for adjustment and management of vascular access device: Secondary | ICD-10-CM | POA: Diagnosis not present

## 2017-11-17 DIAGNOSIS — E119 Type 2 diabetes mellitus without complications: Secondary | ICD-10-CM | POA: Diagnosis not present

## 2017-11-17 MED ORDER — HEPARIN SOD (PORK) LOCK FLUSH 100 UNIT/ML IV SOLN
250.0000 [IU] | Freq: Once | INTRAVENOUS | Status: AC | PRN
  Administered 2017-11-17: 250 [IU]
  Filled 2017-11-17: qty 5

## 2017-11-17 MED ORDER — SODIUM CHLORIDE 0.9% FLUSH
10.0000 mL | INTRAVENOUS | Status: DC | PRN
  Administered 2017-11-17: 10 mL
  Filled 2017-11-17: qty 10

## 2017-11-18 ENCOUNTER — Other Ambulatory Visit: Payer: Self-pay

## 2017-11-18 ENCOUNTER — Ambulatory Visit (HOSPITAL_COMMUNITY)
Admission: RE | Admit: 2017-11-18 | Discharge: 2017-11-18 | Disposition: A | Discharge status: Home / Self Care | Payer: BLUE CROSS/BLUE SHIELD | Source: Ambulatory Visit | Attending: Nurse Practitioner | Admitting: Nurse Practitioner

## 2017-11-18 ENCOUNTER — Encounter (HOSPITAL_COMMUNITY): Payer: Self-pay

## 2017-11-18 ENCOUNTER — Ambulatory Visit (HOSPITAL_COMMUNITY)
Admission: RE | Admit: 2017-11-18 | Discharge: 2017-11-18 | Disposition: A | Discharge status: Home / Self Care | Payer: BLUE CROSS/BLUE SHIELD | Source: Ambulatory Visit | Attending: Oncology | Admitting: Oncology

## 2017-11-18 DIAGNOSIS — C250 Malignant neoplasm of head of pancreas: Secondary | ICD-10-CM | POA: Diagnosis not present

## 2017-11-18 DIAGNOSIS — C799 Secondary malignant neoplasm of unspecified site: Secondary | ICD-10-CM

## 2017-11-18 LAB — GLUCOSE, CAPILLARY: Glucose-Capillary: 131 mg/dL — ABNORMAL HIGH (ref 70–99)

## 2017-11-18 MED ORDER — CEFAZOLIN SODIUM-DEXTROSE 2-4 GM/100ML-% IV SOLN: 2.0000 g | Freq: Once | INTRAVENOUS | Status: DC

## 2017-11-18 MED ORDER — SODIUM CHLORIDE 0.9% FLUSH
10.0000 mL | Freq: Once | INTRAVENOUS | Status: AC
  Administered 2017-11-18: 10 mL via INTRAVENOUS

## 2017-11-18 MED ORDER — SODIUM CHLORIDE 0.9 % IV SOLN: INTRAVENOUS | Status: DC

## 2017-11-18 NOTE — Progress Notes (Signed)
Patient ID: Jerry Weiss, male   DOB: 04-23-1955, 62 y.o.   MRN: [id number] Patient presented to radiology department today for outpatient Port-A-Cath placement.  Patient took dose of Xarelto this morning, therefore Port-A-Cath will be rescheduled to 11/23/2017 at [facility].  Patient given preprocedure instructions.  Dr. Gearldine Shown nurse notified.

## 2017-11-19 ENCOUNTER — Ambulatory Visit: Payer: BLUE CROSS/BLUE SHIELD

## 2017-11-19 DIAGNOSIS — Z4800 Encounter for change or removal of nonsurgical wound dressing: Secondary | ICD-10-CM | POA: Diagnosis not present

## 2017-11-19 DIAGNOSIS — C772 Secondary and unspecified malignant neoplasm of intra-abdominal lymph nodes: Secondary | ICD-10-CM | POA: Diagnosis not present

## 2017-11-19 DIAGNOSIS — G893 Neoplasm related pain (acute) (chronic): Secondary | ICD-10-CM | POA: Diagnosis not present

## 2017-11-19 DIAGNOSIS — N289 Disorder of kidney and ureter, unspecified: Secondary | ICD-10-CM | POA: Diagnosis not present

## 2017-11-19 DIAGNOSIS — K219 Gastro-esophageal reflux disease without esophagitis: Secondary | ICD-10-CM | POA: Diagnosis not present

## 2017-11-19 DIAGNOSIS — L89152 Pressure ulcer of sacral region, stage 2: Secondary | ICD-10-CM | POA: Diagnosis not present

## 2017-11-19 DIAGNOSIS — I7100 Dissection of unspecified site of aorta: Secondary | ICD-10-CM | POA: Diagnosis not present

## 2017-11-19 DIAGNOSIS — N261 Atrophy of kidney (terminal): Secondary | ICD-10-CM | POA: Diagnosis not present

## 2017-11-19 DIAGNOSIS — I82401 Acute embolism and thrombosis of unspecified deep veins of right lower extremity: Secondary | ICD-10-CM | POA: Diagnosis not present

## 2017-11-19 DIAGNOSIS — C250 Malignant neoplasm of head of pancreas: Secondary | ICD-10-CM | POA: Diagnosis not present

## 2017-11-19 DIAGNOSIS — D649 Anemia, unspecified: Secondary | ICD-10-CM | POA: Diagnosis not present

## 2017-11-19 DIAGNOSIS — C787 Secondary malignant neoplasm of liver and intrahepatic bile duct: Secondary | ICD-10-CM | POA: Diagnosis not present

## 2017-11-19 DIAGNOSIS — E119 Type 2 diabetes mellitus without complications: Secondary | ICD-10-CM | POA: Diagnosis not present

## 2017-11-21 ENCOUNTER — Telehealth: Payer: Self-pay | Admitting: *Deleted

## 2017-11-21 NOTE — Telephone Encounter (Signed)
Patient's daughter called. Patient has numerous mouth sores in his gums and his tongue. They are bleeding slightly. No other bleeding concerns at this time. Patient declines need to be seen. Would like to try [place] first and will call with any continued concerns for the mouth sores.   Daughter would like to find out if it would be possible to switch treatment from Wednesday to Tuesday next week. Dr. [name] agrees with this if it is possible.

## 2017-11-22 ENCOUNTER — Other Ambulatory Visit: Payer: Self-pay | Admitting: Physician Assistant

## 2017-11-23 ENCOUNTER — Encounter (HOSPITAL_COMMUNITY): Payer: Self-pay | Admitting: Interventional Radiology

## 2017-11-23 ENCOUNTER — Ambulatory Visit (HOSPITAL_COMMUNITY): Payer: BLUE CROSS/BLUE SHIELD

## 2017-11-23 ENCOUNTER — Other Ambulatory Visit: Payer: BLUE CROSS/BLUE SHIELD

## 2017-11-23 ENCOUNTER — Ambulatory Visit: Payer: BLUE CROSS/BLUE SHIELD | Admitting: Oncology

## 2017-11-23 ENCOUNTER — Other Ambulatory Visit: Payer: Self-pay | Admitting: *Deleted

## 2017-11-23 ENCOUNTER — Ambulatory Visit (HOSPITAL_COMMUNITY)
Admission: RE | Admit: 2017-11-23 | Discharge: 2017-11-23 | Disposition: A | Discharge status: Home / Self Care | Payer: BLUE CROSS/BLUE SHIELD | Source: Ambulatory Visit | Attending: Oncology | Admitting: Oncology

## 2017-11-23 ENCOUNTER — Ambulatory Visit: Payer: BLUE CROSS/BLUE SHIELD

## 2017-11-23 ENCOUNTER — Other Ambulatory Visit: Payer: Self-pay | Admitting: Nurse Practitioner

## 2017-11-23 ENCOUNTER — Ambulatory Visit (HOSPITAL_COMMUNITY)
Admission: RE | Admit: 2017-11-23 | Discharge: 2017-11-23 | Disposition: A | Discharge status: Home / Self Care | Payer: BLUE CROSS/BLUE SHIELD | Source: Ambulatory Visit | Attending: Nurse Practitioner | Admitting: Nurse Practitioner

## 2017-11-23 DIAGNOSIS — Z885 Allergy status to narcotic agent status: Secondary | ICD-10-CM | POA: Insufficient documentation

## 2017-11-23 DIAGNOSIS — Z905 Acquired absence of kidney: Secondary | ICD-10-CM | POA: Diagnosis not present

## 2017-11-23 DIAGNOSIS — C799 Secondary malignant neoplasm of unspecified site: Secondary | ICD-10-CM

## 2017-11-23 DIAGNOSIS — K219 Gastro-esophageal reflux disease without esophagitis: Secondary | ICD-10-CM | POA: Diagnosis not present

## 2017-11-23 DIAGNOSIS — I10 Essential (primary) hypertension: Secondary | ICD-10-CM | POA: Insufficient documentation

## 2017-11-23 DIAGNOSIS — E119 Type 2 diabetes mellitus without complications: Secondary | ICD-10-CM | POA: Diagnosis not present

## 2017-11-23 DIAGNOSIS — C259 Malignant neoplasm of pancreas, unspecified: Secondary | ICD-10-CM | POA: Diagnosis not present

## 2017-11-23 DIAGNOSIS — L89152 Pressure ulcer of sacral region, stage 2: Secondary | ICD-10-CM | POA: Diagnosis not present

## 2017-11-23 DIAGNOSIS — I82491 Acute embolism and thrombosis of other specified deep vein of right lower extremity: Secondary | ICD-10-CM | POA: Diagnosis not present

## 2017-11-23 DIAGNOSIS — Z7901 Long term (current) use of anticoagulants: Secondary | ICD-10-CM | POA: Insufficient documentation

## 2017-11-23 DIAGNOSIS — G4733 Obstructive sleep apnea (adult) (pediatric): Secondary | ICD-10-CM | POA: Insufficient documentation

## 2017-11-23 DIAGNOSIS — Z5111 Encounter for antineoplastic chemotherapy: Secondary | ICD-10-CM | POA: Diagnosis not present

## 2017-11-23 HISTORY — PX: IR IMAGING GUIDED PORT INSERTION: IMG5740

## 2017-11-23 LAB — APTT: aPTT: 33 seconds (ref 24–36)

## 2017-11-23 LAB — CBC
HEMATOCRIT: 31.1 % — AB (ref 39.0–52.0)
Hemoglobin: 9.9 g/dL — ABNORMAL LOW (ref 13.0–17.0)
MCH: 28.3 pg (ref 26.0–34.0)
MCHC: 31.8 g/dL (ref 30.0–36.0)
MCV: 88.9 fL (ref 78.0–100.0)
Platelets: 153 10*3/uL (ref 150–400)
RBC: 3.5 MIL/uL — ABNORMAL LOW (ref 4.22–5.81)
RDW: 15.2 % (ref 11.5–15.5)
WBC: 21.8 10*3/uL — AB (ref 4.0–10.5)

## 2017-11-23 LAB — PROTIME-INR
INR: 1.23
Prothrombin Time: 15.4 seconds — ABNORMAL HIGH (ref 11.4–15.2)

## 2017-11-23 MED ORDER — HEPARIN SOD (PORK) LOCK FLUSH 100 UNIT/ML IV SOLN
INTRAVENOUS | Status: AC | PRN
  Administered 2017-11-23: 500 [IU] via INTRAVENOUS

## 2017-11-23 MED ORDER — SODIUM CHLORIDE 0.9 % IV SOLN
INTRAVENOUS | Status: DC
  Administered 2017-11-23: 14:00:00 via INTRAVENOUS

## 2017-11-23 MED ORDER — MIDAZOLAM HCL 2 MG/2ML IJ SOLN
INTRAMUSCULAR | Status: AC
  Filled 2017-11-23: qty 4

## 2017-11-23 MED ORDER — CEFAZOLIN SODIUM-DEXTROSE 2-4 GM/100ML-% IV SOLN
INTRAVENOUS | Status: AC
  Administered 2017-11-23: 2 g via INTRAVENOUS
  Filled 2017-11-23: qty 100

## 2017-11-23 MED ORDER — LIDOCAINE HCL 1 % IJ SOLN
INTRAMUSCULAR | Status: AC
  Filled 2017-11-23: qty 20

## 2017-11-23 MED ORDER — MIDAZOLAM HCL 2 MG/2ML IJ SOLN
INTRAMUSCULAR | Status: AC | PRN
  Administered 2017-11-23: 1 mg via INTRAVENOUS

## 2017-11-23 MED ORDER — FENTANYL CITRATE (PF) 100 MCG/2ML IJ SOLN
INTRAMUSCULAR | Status: AC | PRN
  Administered 2017-11-23: 50 ug via INTRAVENOUS

## 2017-11-23 MED ORDER — FENTANYL CITRATE (PF) 100 MCG/2ML IJ SOLN
INTRAMUSCULAR | Status: AC
  Filled 2017-11-23: qty 2

## 2017-11-23 MED ORDER — LIDOCAINE-EPINEPHRINE (PF) 1 %-1:200000 IJ SOLN
INTRAMUSCULAR | Status: AC | PRN
  Administered 2017-11-23: 10 mL

## 2017-11-23 MED ORDER — HEPARIN SOD (PORK) LOCK FLUSH 100 UNIT/ML IV SOLN
INTRAVENOUS | Status: AC
  Filled 2017-11-23: qty 5

## 2017-11-23 MED ORDER — LIDOCAINE-EPINEPHRINE (PF) 2 %-1:200000 IJ SOLN
INTRAMUSCULAR | Status: AC
  Filled 2017-11-23: qty 20

## 2017-11-23 MED ORDER — LIDOCAINE HCL (PF) 1 % IJ SOLN
INTRAMUSCULAR | Status: AC | PRN
  Administered 2017-11-23: 5 mL

## 2017-11-23 MED ORDER — CEFAZOLIN SODIUM-DEXTROSE 2-4 GM/100ML-% IV SOLN
2.0000 g | Freq: Once | INTRAVENOUS | Status: AC
  Administered 2017-11-23: 2 g via INTRAVENOUS

## 2017-11-23 MED ORDER — MORPHINE SULFATE ER 15 MG PO TBCR: 15.0000 mg | EXTENDED_RELEASE_TABLET | Freq: Two times a day (BID) | ORAL | 0 refills | Status: DC

## 2017-11-23 NOTE — Procedures (Signed)
Interventional Radiology Procedure Note  Procedure: Placement of a right IJ approach single lumen PowerPort.  Tip is positioned at the superior cavoatrial junction and catheter is ready for immediate use.  Complications: No immediate Recommendations:  - Ok to shower tomorrow - Do not submerge for 7 days - Routine line care   Signed,  Heath K. McCullough, MD   

## 2017-11-23 NOTE — H&P (Addendum)
Referring Physician(s): Sherrill,B  Supervising Physician: Jacqulynn Cadet  Patient Status:  [facility]  Chief Complaint:  "I'm here for a port a cath"  Subjective: Patient familiar to IR service from prior liver lesion biopsy on 09/22/2017 and PICC placement on 11/09/2017 (with episode of SVT afterwards).  He has a history of metastatic pancreatic cancer and presents today for Port-A-Cath placement for chemotherapy.  He currently denies fever, headache, chest pain, worsening dyspnea, cough, back pain, nausea, vomiting.  He does have some intermittent abdominal pain and painful mouth sores with some improvement with Magic mouthwash.  He has been on Xarelto as an outpatient for right lower extremity DVT and has held dosing for the past 48 hours. Additional medical hx as below.   Past Medical History:  Diagnosis Date  . Anemia    mild  . 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 Diabetes mellitus without complication ([place])    type2  . GERD (gastroesophageal reflux disease)   . Hypertension   . Inguinal hernia    Bilateral age 20  . Leg muscle spasm    both legs occ  . OSA (obstructive sleep apnea)    Wears CPAP at night  . Pneumonia    history of x3  . Renal cancer, right ([place])   . 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 Renal mass, right   . Status post dilation of esophageal narrowing   . Umbilical hernia    Small noted on CT 05/26/2017  . Ventral hernia    noted on CT 05/26/2017   Past Surgical History:  Procedure Laterality Date  . BILIARY STENT PLACEMENT  09/21/2017   Procedure: BILIARY STENT PLACEMENT;  Surgeon: Clarene Essex, MD;  Location: [facility] ENDOSCOPY;  Service: Endoscopy;;  . BILIARY STENT PLACEMENT  09/23/2017   Procedure: BILIARY STENT PLACEMENT;  Surgeon: Clarene Essex, MD;  Location: [place];  Service: Endoscopy;;  . BRONCHIAL BRUSHINGS  09/21/2017   Procedure: BILIARY BRUSHINGS;  Surgeon: Clarene Essex, MD;  Location: [place];  Service: Endoscopy;;  . CHOLECYSTECTOMY N/A 10/03/2017   Procedure: LAPAROSCOPIC CHOLECYSTECTOMY;  Surgeon: Excell Seltzer, MD;  Location: [place];  Service: General;  Laterality: N/A;  . COLONOSCOPY    . ERCP N/A 09/21/2017   Procedure: ENDOSCOPIC RETROGRADE CHOLANGIOPANCREATOGRAPHY (ERCP);  Surgeon: Clarene Essex, MD;  Location: [place];  Service: Endoscopy;  Laterality: N/A;  . ERCP N/A 09/23/2017   Procedure: ENDOSCOPIC RETROGRADE CHOLANGIOPANCREATOGRAPHY (ERCP);  Surgeon: Clarene Essex, MD;  Location: [place];  Service: Endoscopy;  Laterality: N/A;  . HERNIA REPAIR Bilateral    AGE 61  . PICC LINE INSERTION    . ROBOT ASSISTED LAPAROSCOPIC NEPHRECTOMY Right 07/28/2017   Procedure: XI ROBOTIC ASSISTED LAPAROSCOPIC PARTIAL NEPHRECTOMY;  Surgeon: Raynelle Bring, MD;  Location: [facility];  Service: Urology;  Laterality: Right;  . SPHINCTEROTOMY  09/21/2017   Procedure: SPHINCTEROTOMY;  Surgeon: Clarene Essex, MD;  Location: [facility] ENDOSCOPY;  Service: Endoscopy;;  . Bess Kinds CHOLANGIOSCOPY N/A 09/23/2017   Procedure: EYEMVVKP CHOLANGIOSCOPY;  Surgeon: Clarene Essex, MD;  Location: [place];  Service: Endoscopy;  Laterality: N/A;  . TONSILLECTOMY     age 9  . TRANSURETHRAL RESECTION OF PROSTATE    . TRANSURETHRAL RESECTION OF PROSTATE N/A 09/08/2017   Procedure: TRANSURETHRAL RESECTION OF THE PROSTATE (TURP);  Surgeon: Irine Seal, MD;  Location[facility];  Service: Urology;  Laterality: N/A;  .  UPPER GASTROINTESTINAL ENDOSCOPY        Allergies: Ciprofloxacin; Codeine; and Shellfish-derived products  Medications: Prior to Admission medications   Medication Sig Start Date End Date Taking? Authorizing Provider  amLODipine (NORVASC) 10 MG tablet Take 10 mg by mouth every evening.     [provider]  azithromycin (ZITHROMAX) 250 MG tablet  11/13/17   [provider]  diphenhydrAMINE (BENADRYL)  25 mg capsule Take 1 capsule (25 mg total) by mouth every 6 (six) hours as needed for itching. Patient not taking: Reported on 11/09/2017 10/14/17   Nita Sells, MD  famotidine (PEPCID) 20 MG tablet Take 1 tablet (20 mg total) by mouth daily. 10/15/17   Nita Sells, MD  feeding supplement, ENSURE ENLIVE, (ENSURE ENLIVE) LIQD Take 237 mLs by mouth 3 (three) times daily with meals. 10/14/17   Nita Sells, MD  gabapentin (NEURONTIN) 300 MG capsule Take 300 mg by mouth daily as needed (neuropathy).     [provider]  hydrALAZINE (APRESOLINE) 100 MG tablet Take 100 mg by mouth 3 (three) times daily.    [provider]  HYDROmorphone (DILAUDID) 4 MG tablet Take 1 tablet (4 mg total) by mouth every 4 (four) hours as needed for severe pain. 10/28/17   Owens Shark, NP  labetalol (NORMODYNE) 200 MG tablet Take 400 mg by mouth 3 (three) times daily.     [provider]  methocarbamol (ROBAXIN) 500 MG tablet Take 2 tablets (1,000 mg total) by mouth 4 (four) times daily. 10/14/17   Nita Sells, MD  metoCLOPramide (REGLAN) 10 MG tablet Take 1 tablet (10 mg total) by mouth 3 (three) times daily before meals. 10/28/17   Owens Shark, NP  morphine (MS CONTIN) 15 MG 12 hr tablet Take 1 tablet (15 mg total) by mouth every 12 (twelve) hours. 11/23/17   Ladell Pier, MD  neomycin-polymyxin-dexameth (MAXITROL) 0.1 % OINT Place 1 application into both eyes daily as needed (eye irritation).    [provider]  ondansetron (ZOFRAN) 4 MG tablet TAKE 1 TABLET(4 MG) BY MOUTH TWICE DAILY AS NEEDED 11/15/17   Ladell Pier, MD  pantoprazole (PROTONIX) 20 MG tablet Take 20 mg by mouth daily.  07/11/16   [provider]  pioglitazone (ACTOS) 15 MG tablet Take 15 mg by mouth daily.    [provider]  polyethylene glycol (MIRALAX / GLYCOLAX) packet Take 17 g by mouth daily as needed for mild constipation or moderate constipation. 09/26/17    Samuella Cota, MD  promethazine (PHENERGAN) 12.5 MG tablet TAKE 1 OR 2 TABLETS BY MOUTH EVERY 6 HOURS AS NEEDED FOR NAUSEA AND VOMITING 11/06/17   [provider]  Rivaroxaban 15 & 20 MG TBPK Start with one 29m tablet by mouth twice a day with food. On Day 22, switch to one 247mtablet once a day with food. Patient taking differently: Take 15 mg by mouth 2 (two) times daily with a meal. Start with one 1526mablet by mouth twice a day with food. On Day 22, switch to one 38m71mblet once a day with food. 10/28/17   ThomOwens Shark  sitaGLIPtin (JANUVIA) 100 MG tablet Take 100 mg by mouth daily.    [provider]     Vital Signs:pend   Physical Exam awake, alert.  Chest with slightly diminished breath sounds right base, left clear.  Heart with regular rate and rhythm.  Abdomen soft, positive bowel sounds, mildly tender right abdominal region.  Trace pretibial edema bilaterally.  Imaging: No results found.  Labs:  CBC: Recent Labs    10/12/17 0624 10/13/17 0504 10/14/17 0554 11/09/17 1005  WBC 13.2* 14.4* 13.9* 16.6*  HGB 9.0* 8.6* 9.0* 9.7*  HCT 29.3* 28.2* 28.7* 30.8*  PLT 323 351 385 302    COAGS: Recent Labs    09/20/17 1532  INR 1.11    BMP: Recent Labs    10/13/17 0504 10/14/17 0554 11/09/17 1005 11/15/17 0808  NA 141 138 139 133*  K 3.2* 3.4* 3.7 4.2  CL 106 103 106 100  CO2 26 25 22 23   GLUCOSE 106* 100* 139* 90  BUN 9 7* 16 20  CALCIUM 8.3* 8.5* 8.3* 8.2*  CREATININE 1.17 1.11 1.27* 1.12  GFRNONAA >60 >60 59* >60  GFRAA >60 >60 >60 >60    LIVER FUNCTION TESTS: Recent Labs    10/10/17 0552 10/11/17 0636 10/13/17 0504 10/14/17 0554 11/15/17 0808  BILITOT 0.9 1.1 1.0  --  1.1  AST 28 39 35  --  35  ALT 20 27 29   --  16  ALKPHOS 197* 219* 223*  --  291*  PROT 6.2* 6.4* 5.8*  --  6.0*  ALBUMIN 2.0* 2.2* 2.1* 2.2* 2.1*    Assessment and Plan: Pt with history of metastatic pancreatic cancer ; presents today for  Port-A-Cath placement for chemotherapy. Risks and benefits of image guided port-a-catheter placement was discussed with the patient including, but not limited to bleeding, infection, pneumothorax, or fibrin sheath development and need for additional procedures.  All of the patient's questions were answered, patient is agreeable to proceed. Consent signed and in chart.  LABS PENDING   Electronically Signed: D. Rowe Robert, PA-C 11/23/2017, 1:51 PM   I spent a total of 20 minutes at the the patient's bedside AND on the patient's hospital floor or unit, greater than 50% of which was counseling/coordinating care for port a cath placement

## 2017-11-23 NOTE — Discharge Instructions (Signed)
Implanted Port Insertion, Care After °This sheet gives you information about how to care for yourself after your procedure. Your health care provider may also give you more specific instructions. If you have problems or questions, contact your health care provider. °What can I expect after the procedure? °After your procedure, it is common to have: °· Discomfort at the port insertion site. °· Bruising on the skin over the port. This should improve over 3-4 days. ° °Follow these instructions at home: °Port care °· After your port is placed, you will get a manufacturer's information card. The card has information about your port. Keep this card with you at all times. °· Take care of the port as told by your health care provider. Ask your health care provider if you or a family member can get training for taking care of the port at home. A home health care nurse may also take care of the port. °· Make sure to remember what type of port you have. °Incision care °· Follow instructions from your health care provider about how to take care of your port insertion site. Make sure you: °? Wash your hands with soap and water before you change your bandage (dressing). If soap and water are not available, use hand sanitizer. °? Change your dressing as told by your health care provider. °? Leave stitches (sutures), skin glue, or adhesive strips in place. These skin closures may need to stay in place for 2 weeks or longer. If adhesive strip edges start to loosen and curl up, you may trim the loose edges. Do not remove adhesive strips completely unless your health care provider tells you to do that. °· Check your port insertion site every day for signs of infection. Check for: °? More redness, swelling, or pain. °? More fluid or blood. °? Warmth. °? Pus or a bad smell. °General instructions °· Do not take baths, swim, or use a hot tub until your health care provider approves. °· Do not lift anything that is heavier than 10 lb (4.5  kg) for a week, or as told by your health care provider. °· Ask your health care provider when it is okay to: °? Return to work or school. °? Resume usual physical activities or sports. °· Do not drive for 24 hours if you were given a medicine to help you relax (sedative). °· Take over-the-counter and prescription medicines only as told by your health care provider. °· Wear a medical alert bracelet in case of an emergency. This will tell any health care providers that you have a port. °· Keep all follow-up visits as told by your health care provider. This is important. °Contact a health care provider if: °· You cannot flush your port with saline as directed, or you cannot draw blood from the port. °· You have a fever or chills. °· You have more redness, swelling, or pain around your port insertion site. °· You have more fluid or blood coming from your port insertion site. °· Your port insertion site feels warm to the touch. °· You have pus or a bad smell coming from the port insertion site. °Get help right away if: °· You have chest pain or shortness of breath. °· You have bleeding from your port that you cannot control. °Summary °· Take care of the port as told by your health care provider. °· Change your dressing as told by your health care provider. °· Keep all follow-up visits as told by your health care provider. °  This information is not intended to replace advice given to you by your health care provider. Make sure you discuss any questions you have with your health care provider. °Document Released: 12/20/2012 Document Revised: 01/21/2016 Document Reviewed: 01/21/2016 °Elsevier Interactive Patient Education © 2017 [employer] °Moderate Conscious Sedation, Adult, Care After °These instructions provide you with information about caring for yourself after your procedure. Your health care provider may also give you more specific instructions. Your treatment has been planned according to current medical  practices, but problems sometimes occur. Call your health care provider if you have any problems or questions after your procedure. °What can I expect after the procedure? °After your procedure, it is common: °· To feel sleepy for several hours. °· To feel clumsy and have poor balance for several hours. °· To have poor judgment for several hours. °· To vomit if you eat too soon. ° °Follow these instructions at home: °For at least 24 hours after the procedure: ° °· Do not: °? Participate in activities where you could fall or become injured. °? Drive. °? Use heavy machinery. °? Drink alcohol. °? Take sleeping pills or medicines that cause drowsiness. °? Make important decisions or sign legal documents. °? Take care of children on your own. °· Rest. °Eating and drinking °· Follow the diet recommended by your health care provider. °· If you vomit: °? Drink water, juice, or soup when you can drink without vomiting. °? Make sure you have little or no nausea before eating solid foods. °General instructions °· Have a responsible adult stay with you until you are awake and alert. °· Take over-the-counter and prescription medicines only as told by your health care provider. °· If you smoke, do not smoke without supervision. °· Keep all follow-up visits as told by your health care provider. This is important. °Contact a health care provider if: °· You keep feeling nauseous or you keep vomiting. °· You feel light-headed. °· You develop a rash. °· You have a fever. °Get help right away if: °· You have trouble breathing. °This information is not intended to replace advice given to you by your health care provider. Make sure you discuss any questions you have with your health care provider. °Document Released: 12/20/2012 Document Revised: 08/04/2015 Document Reviewed: 06/21/2015 °Elsevier Interactive Patient Education © 2018 Elsevier Inc. ° °

## 2017-11-24 ENCOUNTER — Other Ambulatory Visit: Payer: Self-pay | Admitting: *Deleted

## 2017-11-24 DIAGNOSIS — C787 Secondary malignant neoplasm of liver and intrahepatic bile duct: Secondary | ICD-10-CM | POA: Diagnosis not present

## 2017-11-24 DIAGNOSIS — Z4800 Encounter for change or removal of nonsurgical wound dressing: Secondary | ICD-10-CM | POA: Diagnosis not present

## 2017-11-24 DIAGNOSIS — L89152 Pressure ulcer of sacral region, stage 2: Secondary | ICD-10-CM | POA: Diagnosis not present

## 2017-11-24 DIAGNOSIS — C250 Malignant neoplasm of head of pancreas: Secondary | ICD-10-CM | POA: Diagnosis not present

## 2017-11-24 MED ORDER — NYSTATIN 100000 UNIT/GM EX POWD: Freq: Four times a day (QID) | CUTANEOUS | 0 refills | Status: DC

## 2017-11-25 ENCOUNTER — Other Ambulatory Visit: Payer: Self-pay | Admitting: *Deleted

## 2017-11-25 ENCOUNTER — Inpatient Hospital Stay: Admission: RE | Admit: 2017-11-25 | Payer: BLUE CROSS/BLUE SHIELD | Source: Ambulatory Visit

## 2017-11-25 ENCOUNTER — Other Ambulatory Visit: Payer: BLUE CROSS/BLUE SHIELD

## 2017-11-25 DIAGNOSIS — C799 Secondary malignant neoplasm of unspecified site: Secondary | ICD-10-CM

## 2017-11-25 MED ORDER — HYDROMORPHONE HCL 4 MG PO TABS: 4.0000 mg | ORAL_TABLET | ORAL | 0 refills | Status: DC | PRN

## 2017-11-26 ENCOUNTER — Other Ambulatory Visit: Payer: Self-pay | Admitting: Oncology

## 2017-11-28 ENCOUNTER — Telehealth: Payer: Self-pay | Admitting: Nurse Practitioner

## 2017-11-28 ENCOUNTER — Inpatient Hospital Stay (HOSPITAL_BASED_OUTPATIENT_CLINIC_OR_DEPARTMENT_OTHER): Payer: BLUE CROSS/BLUE SHIELD | Admitting: Nurse Practitioner

## 2017-11-28 ENCOUNTER — Telehealth: Payer: Self-pay | Admitting: Oncology

## 2017-11-28 ENCOUNTER — Inpatient Hospital Stay: Payer: BLUE CROSS/BLUE SHIELD

## 2017-11-28 ENCOUNTER — Other Ambulatory Visit: Payer: Self-pay | Admitting: Nurse Practitioner

## 2017-11-28 ENCOUNTER — Encounter: Payer: Self-pay | Admitting: Nurse Practitioner

## 2017-11-28 VITALS — BP 104/69 | HR 83 | Temp 98.7°F | Resp 18 | Ht 67.0 in | Wt 159.3 lb

## 2017-11-28 DIAGNOSIS — Z4800 Encounter for change or removal of nonsurgical wound dressing: Secondary | ICD-10-CM | POA: Diagnosis not present

## 2017-11-28 DIAGNOSIS — I82401 Acute embolism and thrombosis of unspecified deep veins of right lower extremity: Secondary | ICD-10-CM

## 2017-11-28 DIAGNOSIS — C259 Malignant neoplasm of pancreas, unspecified: Secondary | ICD-10-CM | POA: Diagnosis not present

## 2017-11-28 DIAGNOSIS — C787 Secondary malignant neoplasm of liver and intrahepatic bile duct: Secondary | ICD-10-CM

## 2017-11-28 DIAGNOSIS — Z452 Encounter for adjustment and management of vascular access device: Secondary | ICD-10-CM | POA: Diagnosis not present

## 2017-11-28 DIAGNOSIS — D649 Anemia, unspecified: Secondary | ICD-10-CM | POA: Diagnosis not present

## 2017-11-28 DIAGNOSIS — C250 Malignant neoplasm of head of pancreas: Secondary | ICD-10-CM | POA: Diagnosis not present

## 2017-11-28 DIAGNOSIS — E119 Type 2 diabetes mellitus without complications: Secondary | ICD-10-CM | POA: Diagnosis not present

## 2017-11-28 DIAGNOSIS — Z23 Encounter for immunization: Secondary | ICD-10-CM | POA: Diagnosis not present

## 2017-11-28 DIAGNOSIS — Z5111 Encounter for antineoplastic chemotherapy: Secondary | ICD-10-CM | POA: Diagnosis not present

## 2017-11-28 DIAGNOSIS — Z7901 Long term (current) use of anticoagulants: Secondary | ICD-10-CM

## 2017-11-28 DIAGNOSIS — L89152 Pressure ulcer of sacral region, stage 2: Secondary | ICD-10-CM | POA: Diagnosis not present

## 2017-11-28 DIAGNOSIS — Z95828 Presence of other vascular implants and grafts: Secondary | ICD-10-CM

## 2017-11-28 LAB — CMP (CANCER CENTER ONLY)
ALT: 13 U/L (ref 0–44)
AST: 37 U/L (ref 15–41)
Albumin: 2.3 g/dL — ABNORMAL LOW (ref 3.5–5.0)
Alkaline Phosphatase: 222 U/L — ABNORMAL HIGH (ref 38–126)
Anion gap: 10 (ref 5–15)
BUN: 26 mg/dL — AB (ref 8–23)
CHLORIDE: 100 mmol/L (ref 98–111)
CO2: 23 mmol/L (ref 22–32)
Calcium: 7.9 mg/dL — ABNORMAL LOW (ref 8.9–10.3)
Creatinine: 1.35 mg/dL — ABNORMAL HIGH (ref 0.61–1.24)
GFR, EST NON AFRICAN AMERICAN: 55 mL/min — AB (ref >60)
GFR, Est AFR Am: >60 mL/min (ref >60)
Glucose, Bld: 112 mg/dL — ABNORMAL HIGH (ref 70–99)
POTASSIUM: 5.1 mmol/L (ref 3.5–5.1)
Sodium: 133 mmol/L — ABNORMAL LOW (ref 135–145)
Total Bilirubin: 1.2 mg/dL (ref 0.3–1.2)
Total Protein: 5.9 g/dL — ABNORMAL LOW (ref 6.5–8.1)

## 2017-11-28 LAB — CBC WITH DIFFERENTIAL (CANCER CENTER ONLY)
Basophils Absolute: 0.1 10*3/uL (ref 0.0–0.1)
Basophils Relative: 1 %
EOS PCT: 4 %
Eosinophils Absolute: 0.5 10*3/uL (ref 0.0–0.5)
HEMATOCRIT: 27.4 % — AB (ref 38.4–49.9)
HEMOGLOBIN: 8.5 g/dL — AB (ref 13.0–17.1)
LYMPHS ABS: 1.6 10*3/uL (ref 0.9–3.3)
LYMPHS PCT: 13 %
MCH: 27.1 pg — AB (ref 27.2–33.4)
MCHC: 30.8 g/dL — ABNORMAL LOW (ref 32.0–36.0)
MCV: 87.9 fL (ref 79.3–98.0)
MONOS PCT: 9 %
Monocytes Absolute: 1.1 10*3/uL — ABNORMAL HIGH (ref 0.1–0.9)
NEUTROS PCT: 73 %
Neutro Abs: 9.3 10*3/uL — ABNORMAL HIGH (ref 1.5–6.5)
Platelet Count: 168 10*3/uL (ref 140–400)
RBC: 3.12 MIL/uL — AB (ref 4.20–5.82)
RDW: 16.4 % — ABNORMAL HIGH (ref 11.0–14.6)
WBC: 12.6 10*3/uL — AB (ref 4.0–10.3)

## 2017-11-28 MED ORDER — SODIUM CHLORIDE 0.9% FLUSH
10.0000 mL | INTRAVENOUS | Status: DC | PRN
  Administered 2017-11-28: 10 mL
  Filled 2017-11-28: qty 10

## 2017-11-28 MED ORDER — RIVAROXABAN 20 MG PO TABS: 20.0000 mg | ORAL_TABLET | Freq: Every day | ORAL | 3 refills | Status: DC

## 2017-11-28 MED ORDER — MAGIC MOUTHWASH: 5.0000 mL | Freq: Four times a day (QID) | ORAL | 0 refills | Status: AC | PRN

## 2017-11-28 MED ORDER — ONDANSETRON HCL 8 MG PO TABS: 8.0000 mg | ORAL_TABLET | Freq: Three times a day (TID) | ORAL | 2 refills | Status: DC | PRN

## 2017-11-28 NOTE — Progress Notes (Addendum)
[place] OFFICE PROGRESS NOTE   Diagnosis: Pancreas cancer  INTERVAL HISTORY:   Mr. Waltman returns as scheduled.  He completed cycle 1 FOLFOX 11/15/2017.  He developed nausea/vomiting on day 2, lasting for 2 days.  He developed significant mouth ulcerations beginning day 2.  The mouth ulcers are better but he continues to note discomfort.  He is utilizing Magic mouthwash.  No diarrhea.  Cold sensitivity lasted 1 day.  No hand or foot pain or redness.  Pain overall is controlled with the long-acting pain medicine and scheduled Dilaudid.  He notes improvement in his appetite and energy level.  Objective:  Vital signs in last 24 hours:  Blood pressure 104/69, pulse 83, temperature 98.7 F (37.1 C), temperature source Oral, resp. rate 18, height 5' 7"  (1.702 m), weight 159 lb 4.8 oz (72.3 kg), SpO2 98 %.    HEENT: Mouth in general is erythematous.  Appears to be scattered healing ulcerations. Resp: Lungs clear bilaterally. Cardio: Regular rate and rhythm. GI: Abdomen is soft, nontender. Vascular: No leg edema. Port-A-Cath without erythema.  Lab Results:  Lab Results  Component Value Date   WBC 12.6 (H) 11/28/2017   HGB 8.5 (L) 11/28/2017   HCT 27.4 (L) 11/28/2017   MCV 87.9 11/28/2017   PLT 168 11/28/2017   NEUTROABS 9.3 (H) 11/28/2017    Imaging:  No results found.  Medications: I have reviewed the patient's current medications.  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 09/22/2017 clinical and radiologic presentation most consistent with pancreas cancer  CT abdomen/pelvis 09/20/2017-new hypodense liver lesions, gallbladder distention with intra-and extrahepatic biliary dilatation  ERCP 09/21/2017 and 09/23/2017, distal common duct stricture, status post placement of ridging, duct stents, cytology from biliary brushing-nondiagnostic  Ultrasound-guided biopsy of a right liver lesion 09/22/2017,  metastatic poorly differentiated carcinoma, MSI-stable, tumor mutation burden 5, KRAS G12R, KEIT amplification, PDGFRA amplification  CT angiogram chest, abdomen, and pelvis 09/30/2017-ill-defined soft tissue in the uncinate process, stable type B aortic dissection, distended gallbladder, multiple hepatic metastases, retroperitoneal and periportal adenopathy, infarcted/atrophic left kidney  PET scan 11/03/2017- numerous liver metastases, hypermetabolic mass at the pancreas uncinate and head, increased metabolic activity of the umbilicus, right rectus abdominis, right abdominal pannus  Cycle 1 FOLFOX 11/15/2017  Cycle 2 FOLFOX 11/29/2017 (5-FU dose reduced due to mucositis)  2.Acute cholecystitis, status post a cholecystectomy 10/03/2017  Gallbladder pathology-acute and chronic cholecystitis with cholelithiasis; no evidence of malignancy  Pathology on cystic duct lymph node-metastatic carcinoma;CK7, PAX-8, CD10 and p63 positive;CK 20, GATA-3, CDX-2 and CD117 negative 3.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 Pain-likely secondary to cholecystitisandthe cholecystectomy versus metastatic carcinoma;MS Contin 15 mg every 12 hours beginning 10/20/2017, Dilaudid as needed 12.  Right lower extremity DVT 10/28/2017- treated with Xarelto 13.  Port-A-Cath placement 11/23/2017 14.  Significant mucositis following cycle 1 FOLFOX.  5-FU dose reduced beginning with cycle 2.   Disposition: Mr. Bivins appears unchanged.  He has completed 1 cycle of FOLFOX.  He appears to have developed significant mucositis following cycle 1.  He had no diarrhea and has no symptoms of hand-foot  syndrome.  Plan to proceed with cycle 2 as scheduled 11/29/2017 with dose reduction of the 5-fluorouracil.  We reviewed the CBC from today.  Counts are  adequate for treatment.  He will return for lab, follow-up and cycle 3 FOLFOX in 2 weeks.  He will contact the office in the interim with any problems.  Patient seen with Dr. [name].    Ned Card ANP/GNP-BC   11/28/2017  12:05 PM This was a shared visit with Ned Card.  Mr. Ewings was interviewed and examined.  He appears to have developed severe mucositis following cycle 1 FOLFOX.  He had no other symptoms of 5-FU toxicity.  I have a low clinical suspicion for DPD toxicity.  We discussed delaying chemotherapy and dose reduction.  Mr. Muehl would like to proceed with chemotherapy.  The 5-FU will be dose reduced and the leucovorin will be held with this cycle.  His overall performance status appears improved following chemotherapy.  [name], MD

## 2017-11-28 NOTE — Telephone Encounter (Signed)
Pt scheduled per 9/16 sch message

## 2017-11-28 NOTE — Telephone Encounter (Signed)
appts scheduled avs/calendar printed per 9/16 los

## 2017-11-29 ENCOUNTER — Inpatient Hospital Stay: Payer: BLUE CROSS/BLUE SHIELD

## 2017-11-29 ENCOUNTER — Telehealth: Payer: Self-pay | Admitting: Oncology

## 2017-11-29 VITALS — BP 123/71 | HR 82 | Temp 98.5°F | Resp 16

## 2017-11-29 DIAGNOSIS — I82401 Acute embolism and thrombosis of unspecified deep veins of right lower extremity: Secondary | ICD-10-CM | POA: Diagnosis not present

## 2017-11-29 DIAGNOSIS — C250 Malignant neoplasm of head of pancreas: Secondary | ICD-10-CM

## 2017-11-29 DIAGNOSIS — Z452 Encounter for adjustment and management of vascular access device: Secondary | ICD-10-CM | POA: Diagnosis not present

## 2017-11-29 DIAGNOSIS — C787 Secondary malignant neoplasm of liver and intrahepatic bile duct: Secondary | ICD-10-CM | POA: Diagnosis not present

## 2017-11-29 DIAGNOSIS — D649 Anemia, unspecified: Secondary | ICD-10-CM | POA: Diagnosis not present

## 2017-11-29 DIAGNOSIS — Z23 Encounter for immunization: Secondary | ICD-10-CM | POA: Diagnosis not present

## 2017-11-29 DIAGNOSIS — E119 Type 2 diabetes mellitus without complications: Secondary | ICD-10-CM | POA: Diagnosis not present

## 2017-11-29 DIAGNOSIS — Z7901 Long term (current) use of anticoagulants: Secondary | ICD-10-CM | POA: Diagnosis not present

## 2017-11-29 DIAGNOSIS — Z5111 Encounter for antineoplastic chemotherapy: Secondary | ICD-10-CM | POA: Diagnosis not present

## 2017-11-29 DIAGNOSIS — C259 Malignant neoplasm of pancreas, unspecified: Secondary | ICD-10-CM | POA: Diagnosis not present

## 2017-11-29 LAB — BASIC METABOLIC PANEL - CANCER CENTER ONLY
Anion gap: 9 (ref 5–15)
BUN: 27 mg/dL — AB (ref 8–23)
CO2: 22 mmol/L (ref 22–32)
Calcium: 8 mg/dL — ABNORMAL LOW (ref 8.9–10.3)
Chloride: 100 mmol/L (ref 98–111)
Creatinine: 1.31 mg/dL — ABNORMAL HIGH (ref 0.61–1.24)
GFR, EST NON AFRICAN AMERICAN: 57 mL/min — AB (ref >60)
GFR, Est AFR Am: >60 mL/min (ref >60)
GLUCOSE: 98 mg/dL (ref 70–99)
POTASSIUM: 4.8 mmol/L (ref 3.5–5.1)
Sodium: 131 mmol/L — ABNORMAL LOW (ref 135–145)

## 2017-11-29 MED ORDER — DEXTROSE 5 % IV SOLN
Freq: Once | INTRAVENOUS | Status: AC
  Administered 2017-11-29: 08:00:00 via INTRAVENOUS
  Filled 2017-11-29: qty 250

## 2017-11-29 MED ORDER — OXALIPLATIN CHEMO INJECTION 100 MG/20ML
82.0000 mg/m2 | Freq: Once | INTRAVENOUS | Status: AC
  Administered 2017-11-29: 150 mg via INTRAVENOUS
  Filled 2017-11-29: qty 30

## 2017-11-29 MED ORDER — SODIUM CHLORIDE 0.9 % IV SOLN
600.0000 mg/m2 | INTRAVENOUS | Status: DC
  Administered 2017-11-29: 1100 mg via INTRAVENOUS
  Filled 2017-11-29: qty 22

## 2017-11-29 MED ORDER — PALONOSETRON HCL INJECTION 0.25 MG/5ML
0.2500 mg | Freq: Once | INTRAVENOUS | Status: AC
  Administered 2017-11-29: 0.25 mg via INTRAVENOUS

## 2017-11-29 MED ORDER — DEXAMETHASONE SODIUM PHOSPHATE 10 MG/ML IJ SOLN
10.0000 mg | Freq: Once | INTRAMUSCULAR | Status: AC
  Administered 2017-11-29: 10 mg via INTRAVENOUS

## 2017-11-29 MED ORDER — DEXAMETHASONE SODIUM PHOSPHATE 10 MG/ML IJ SOLN
INTRAMUSCULAR | Status: AC
  Filled 2017-11-29: qty 1

## 2017-11-29 MED ORDER — INFLUENZA VAC SPLIT QUAD 0.5 ML IM SUSY
PREFILLED_SYRINGE | INTRAMUSCULAR | Status: AC
  Filled 2017-11-29: qty 0.5

## 2017-11-29 MED ORDER — PALONOSETRON HCL INJECTION 0.25 MG/5ML
INTRAVENOUS | Status: AC
  Filled 2017-11-29: qty 5

## 2017-11-29 MED ORDER — INFLUENZA VAC SPLIT QUAD 0.5 ML IM SUSY
0.5000 mL | PREFILLED_SYRINGE | Freq: Once | INTRAMUSCULAR | Status: AC
  Administered 2017-11-29: 0.5 mL via INTRAMUSCULAR

## 2017-11-29 NOTE — Patient Instructions (Addendum)
[facility] Discharge Instructions for Patients Receiving Chemotherapy  Today you received the following chemotherapy agents:  Oxaliplatin and 5FU.  To help prevent nausea and vomiting after your treatment, we encourage you to take your nausea medication as directed.   If you develop nausea and vomiting that is not controlled by your nausea medication, call the clinic.   BELOW ARE SYMPTOMS THAT SHOULD BE REPORTED IMMEDIATELY:  *FEVER GREATER THAN 100.5 F  *CHILLS WITH OR WITHOUT FEVER  NAUSEA AND VOMITING THAT IS NOT CONTROLLED WITH YOUR NAUSEA MEDICATION  *UNUSUAL SHORTNESS OF BREATH  *UNUSUAL BRUISING OR BLEEDING  TENDERNESS IN MOUTH AND THROAT WITH OR WITHOUT PRESENCE OF ULCERS  *URINARY PROBLEMS  *BOWEL PROBLEMS  UNUSUAL RASH Items with * indicate a potential emergency and should be followed up as soon as possible.  Feel free to call the clinic should you have any questions or concerns. The clinic phone number is [phone].  Please show the [place] at check-in to the Emergency Department and triage nurse.

## 2017-11-29 NOTE — Telephone Encounter (Signed)
Pt scheduled per 9/17 sch message.  °

## 2017-11-30 ENCOUNTER — Ambulatory Visit: Payer: BLUE CROSS/BLUE SHIELD | Admitting: Oncology

## 2017-11-30 ENCOUNTER — Telehealth: Payer: Self-pay | Admitting: *Deleted

## 2017-11-30 ENCOUNTER — Telehealth: Payer: Self-pay | Admitting: Vascular Surgery

## 2017-11-30 ENCOUNTER — Other Ambulatory Visit: Payer: BLUE CROSS/BLUE SHIELD

## 2017-11-30 ENCOUNTER — Ambulatory Visit: Payer: BLUE CROSS/BLUE SHIELD

## 2017-11-30 DIAGNOSIS — C250 Malignant neoplasm of head of pancreas: Secondary | ICD-10-CM | POA: Diagnosis not present

## 2017-11-30 DIAGNOSIS — C259 Malignant neoplasm of pancreas, unspecified: Secondary | ICD-10-CM | POA: Diagnosis not present

## 2017-11-30 DIAGNOSIS — R531 Weakness: Secondary | ICD-10-CM | POA: Diagnosis not present

## 2017-11-30 DIAGNOSIS — L89152 Pressure ulcer of sacral region, stage 2: Secondary | ICD-10-CM | POA: Diagnosis not present

## 2017-11-30 NOTE — Telephone Encounter (Signed)
Called daughter, Jerry Weiss back to make sure swallowing was not related to mucositis.  She doesn't think so & states that mucositis is a little better with magic mouthwash.  Encouraged to watch & let us know if not better or worse.  Could be related to oxaliplatin.  She expressed understanding.

## 2017-11-30 NOTE — Telephone Encounter (Signed)
Spoke to pt re missed CT appt. Pt having chemo and can't leave the house. Pt will call to r/s CT and appt with [facility]

## 2017-11-30 NOTE — Telephone Encounter (Signed)
spoke to pt regarding CT scan and Dr. Carlis Abbott appt. Pt is having chemo and can't leave the house. Will call to r/s

## 2017-11-30 NOTE — Telephone Encounter (Signed)
Received vm call from daughter stating that pt is having trouble swallowing.  Returned call this am & she states he is able to swallow liquids & solids & is not choking but does have some difficulty.  She states he has had some brief moments -twice that he blacked out, again just briefly.  Informed that we would let Dr [name] know.

## 2017-12-01 ENCOUNTER — Other Ambulatory Visit: Payer: Self-pay | Admitting: Nurse Practitioner

## 2017-12-01 ENCOUNTER — Inpatient Hospital Stay: Payer: BLUE CROSS/BLUE SHIELD

## 2017-12-01 VITALS — BP 121/75 | HR 88 | Temp 98.1°F | Resp 16

## 2017-12-01 DIAGNOSIS — G893 Neoplasm related pain (acute) (chronic): Secondary | ICD-10-CM | POA: Diagnosis not present

## 2017-12-01 DIAGNOSIS — C787 Secondary malignant neoplasm of liver and intrahepatic bile duct: Secondary | ICD-10-CM | POA: Diagnosis not present

## 2017-12-01 DIAGNOSIS — N289 Disorder of kidney and ureter, unspecified: Secondary | ICD-10-CM | POA: Diagnosis not present

## 2017-12-01 DIAGNOSIS — Z452 Encounter for adjustment and management of vascular access device: Secondary | ICD-10-CM | POA: Diagnosis not present

## 2017-12-01 DIAGNOSIS — Z5111 Encounter for antineoplastic chemotherapy: Secondary | ICD-10-CM | POA: Diagnosis not present

## 2017-12-01 DIAGNOSIS — N261 Atrophy of kidney (terminal): Secondary | ICD-10-CM | POA: Diagnosis not present

## 2017-12-01 DIAGNOSIS — C772 Secondary and unspecified malignant neoplasm of intra-abdominal lymph nodes: Secondary | ICD-10-CM | POA: Diagnosis not present

## 2017-12-01 DIAGNOSIS — C259 Malignant neoplasm of pancreas, unspecified: Secondary | ICD-10-CM | POA: Diagnosis not present

## 2017-12-01 DIAGNOSIS — C250 Malignant neoplasm of head of pancreas: Secondary | ICD-10-CM

## 2017-12-01 DIAGNOSIS — I7100 Dissection of unspecified site of aorta: Secondary | ICD-10-CM | POA: Diagnosis not present

## 2017-12-01 DIAGNOSIS — Z23 Encounter for immunization: Secondary | ICD-10-CM | POA: Diagnosis not present

## 2017-12-01 DIAGNOSIS — I82401 Acute embolism and thrombosis of unspecified deep veins of right lower extremity: Secondary | ICD-10-CM | POA: Diagnosis not present

## 2017-12-01 DIAGNOSIS — Z4800 Encounter for change or removal of nonsurgical wound dressing: Secondary | ICD-10-CM | POA: Diagnosis not present

## 2017-12-01 DIAGNOSIS — K219 Gastro-esophageal reflux disease without esophagitis: Secondary | ICD-10-CM | POA: Diagnosis not present

## 2017-12-01 DIAGNOSIS — D649 Anemia, unspecified: Secondary | ICD-10-CM | POA: Diagnosis not present

## 2017-12-01 DIAGNOSIS — L89152 Pressure ulcer of sacral region, stage 2: Secondary | ICD-10-CM | POA: Diagnosis not present

## 2017-12-01 DIAGNOSIS — Z7901 Long term (current) use of anticoagulants: Secondary | ICD-10-CM | POA: Diagnosis not present

## 2017-12-01 DIAGNOSIS — E119 Type 2 diabetes mellitus without complications: Secondary | ICD-10-CM | POA: Diagnosis not present

## 2017-12-01 MED ORDER — SODIUM CHLORIDE 0.9 % IV SOLN
INTRAVENOUS | Status: DC
  Administered 2017-12-01: 10:00:00 via INTRAVENOUS
  Filled 2017-12-01 (×2): qty 250

## 2017-12-01 MED ORDER — SODIUM CHLORIDE 0.9% FLUSH
10.0000 mL | INTRAVENOUS | Status: DC | PRN
  Administered 2017-12-01: 10 mL
  Filled 2017-12-01: qty 10

## 2017-12-01 MED ORDER — FLUCONAZOLE 100 MG PO TABS: 100.0000 mg | ORAL_TABLET | Freq: Every day | ORAL | 0 refills | Status: DC

## 2017-12-01 MED ORDER — HEPARIN SOD (PORK) LOCK FLUSH 100 UNIT/ML IV SOLN
500.0000 [IU] | Freq: Once | INTRAVENOUS | Status: AC | PRN
  Administered 2017-12-01: 500 [IU]
  Filled 2017-12-01: qty 5

## 2017-12-01 NOTE — Progress Notes (Signed)
Patient presented to treatment area for pump d/c. Advises he has become increasingly fatigued. Also c/o mucositis with difficulty swallowing and persistent nausea with one episode of emesis yesterday. Verbalizes decreased appetite and oral intake. States he is able to drink liquids without difficulty. Ned Card NP notified and she saw Jerry Weiss in treatment area. Orders received for IVF's. States she will send in a Rx for oral yeast. Patient verbalized understanding.

## 2017-12-01 NOTE — Patient Instructions (Signed)
[place]     If you develop nausea and vomiting that is not controlled by your nausea medication, call the clinic.   BELOW ARE SYMPTOMS THAT SHOULD BE REPORTED IMMEDIATELY:  *FEVER GREATER THAN 100.5 F  *CHILLS WITH OR WITHOUT FEVER  NAUSEA AND VOMITING THAT IS NOT CONTROLLED WITH YOUR NAUSEA MEDICATION  *UNUSUAL SHORTNESS OF BREATH  *UNUSUAL BRUISING OR BLEEDING  TENDERNESS IN MOUTH AND THROAT WITH OR WITHOUT PRESENCE OF ULCERS  *URINARY PROBLEMS  *BOWEL PROBLEMS  UNUSUAL RASH Items with * indicate a potential emergency and should be followed up as soon as possible.  Feel free to call the clinic should you have any questions or concerns. The clinic phone number is [phone].  Please show the [place] at check-in to the Emergency Department and triage nurse.  Rehydration, Adult Rehydration is the replacement of body fluids and salts and minerals (electrolytes) that are lost during dehydration. Dehydration is when there is not enough fluid or water in the body. This happens when you lose more fluids than you take in. Common causes of dehydration include:  Vomiting.  Diarrhea.  Excessive sweating, such as from heat exposure or exercise.  Taking medicines that cause the body to lose excess fluid (diuretics).  Impaired kidney function.  Not drinking enough fluid.  Certain illnesses or infections.  Certain poorly controlled long-term (chronic) illnesses, such as diabetes, heart disease, and kidney disease.  Symptoms of mild dehydration may include thirst, dry lips and mouth, dry skin, and dizziness. Symptoms of severe dehydration may include increased heart rate, confusion, fainting, and not urinating. You can rehydrate by drinking certain fluids or getting fluids through an IV tube, as told by your health care provider. What are the risks? Generally, rehydration is safe. However, one problem that can happen is taking in too much fluid  (overhydration). This is rare. If overhydration happens, it can cause an electrolyte imbalance, kidney failure, or a decrease in salt (sodium) levels in the body. How to rehydrate Follow instructions from your health care provider for rehydration. The kind of fluid you should drink and the amount you should drink depend on your condition.  If directed by your health care provider, drink an oral rehydration solution (ORS). This is a drink designed to treat dehydration that is found in pharmacies and retail stores. ? Make an ORS by following instructions on the package. ? Start by drinking small amounts, about  cup (120 mL) every 5-10 minutes. ? Slowly increase how much you drink until you have taken the amount recommended by your health care provider.  Drink enough clear fluids to keep your urine clear or pale yellow. If you were instructed to drink an ORS, finish the ORS first, then start slowly drinking other clear fluids. Drink fluids such as: ? Water. Do not drink only water. Doing that can lead to having too little sodium in your body (hyponatremia). ? Ice chips. ? Fruit juice that you have added water to (diluted juice). ? Low-calorie sports drinks.  If you are severely dehydrated, your health care provider may recommend that you receive fluids through an IV tube in the hospital.  Do not take sodium tablets. Doing that can lead to the condition of having too much sodium in your body (hypernatremia).  Eating while you rehydrate Follow instructions from your health care provider about what to eat while you rehydrate. Your health care provider may recommend that you slowly begin eating regular foods in small amounts.  Eat foods that contain a healthy balance of electrolytes, such as bananas, oranges, potatoes, tomatoes, and spinach.  Avoid foods that are greasy or contain a lot of fat or sugar.  In some cases, you may get nutrition through a feeding tube that is passed through your nose  and into your stomach (nasogastric tube, or NG tube). This may be done if you have uncontrolled vomiting or diarrhea. Beverages to avoid Certain beverages may make dehydration worse. While you rehydrate, avoid:  Alcohol.  Caffeine.  Drinks that contain a lot of sugar. These include: ? High-calorie sports drinks. ? Fruit juice that is not diluted. ? Soda.  Check nutrition labels to see how much sugar or caffeine a beverage contains. Signs of dehydration recovery You may be recovering from dehydration if:  You are urinating more often than before you started rehydrating.  Your urine is clear or pale yellow.  Your energy level improves.  You vomit less frequently.  You have diarrhea less frequently.  Your appetite improves or returns to normal.  You feel less dizzy or less light-headed.  Your skin tone and color start to look more normal.  Contact a health care provider if:  You continue to have symptoms of mild dehydration, such as: ? Thirst. ? Dry lips. ? Slightly dry mouth. ? Dry, warm skin. ? Dizziness.  You continue to vomit or have diarrhea. Get help right away if:  You have symptoms of dehydration that get worse.  You feel: ? Confused. ? Weak. ? Like you are going to faint.  You have not urinated in 6-8 hours.  You have very dark urine.  You have trouble breathing.  Your heart rate while sitting still is over 100 beats a minute.  You cannot drink fluids without vomiting.  You have vomiting or diarrhea that: ? Gets worse. ? Does not go away.  You have a fever. This information is not intended to replace advice given to you by your health care provider. Make sure you discuss any questions you have with your health care provider. Document Released: 05/24/2011 Document Revised: 09/19/2015 Document Reviewed: 04/25/2015 Elsevier Interactive Patient Education  [employer].

## 2017-12-02 ENCOUNTER — Ambulatory Visit: Payer: BLUE CROSS/BLUE SHIELD | Admitting: Vascular Surgery

## 2017-12-02 ENCOUNTER — Other Ambulatory Visit: Payer: Self-pay | Admitting: Emergency Medicine

## 2017-12-02 MED ORDER — CLOTRIMAZOLE-BETAMETHASONE 1-0.05 % EX CREA: 1.0000 "application " | TOPICAL_CREAM | Freq: Two times a day (BID) | CUTANEOUS | 0 refills | Status: DC

## 2017-12-02 NOTE — Progress Notes (Signed)
Beth from [facility] called to say pt needs Lotrisone cream for excessive skin breakdown to buttocks and sacrum. Per Ned Card, NP it is ok to order this cream.

## 2017-12-03 DIAGNOSIS — R531 Weakness: Secondary | ICD-10-CM | POA: Diagnosis not present

## 2017-12-03 DIAGNOSIS — C259 Malignant neoplasm of pancreas, unspecified: Secondary | ICD-10-CM | POA: Diagnosis not present

## 2017-12-03 DIAGNOSIS — C250 Malignant neoplasm of head of pancreas: Secondary | ICD-10-CM | POA: Diagnosis not present

## 2017-12-05 DIAGNOSIS — L89152 Pressure ulcer of sacral region, stage 2: Secondary | ICD-10-CM | POA: Diagnosis not present

## 2017-12-06 ENCOUNTER — Telehealth: Payer: Self-pay | Admitting: *Deleted

## 2017-12-06 ENCOUNTER — Other Ambulatory Visit: Payer: Self-pay | Admitting: Nurse Practitioner

## 2017-12-06 DIAGNOSIS — E119 Type 2 diabetes mellitus without complications: Secondary | ICD-10-CM | POA: Diagnosis not present

## 2017-12-06 DIAGNOSIS — C250 Malignant neoplasm of head of pancreas: Secondary | ICD-10-CM

## 2017-12-06 DIAGNOSIS — Z4800 Encounter for change or removal of nonsurgical wound dressing: Secondary | ICD-10-CM | POA: Diagnosis not present

## 2017-12-06 DIAGNOSIS — K219 Gastro-esophageal reflux disease without esophagitis: Secondary | ICD-10-CM | POA: Diagnosis not present

## 2017-12-06 DIAGNOSIS — C787 Secondary malignant neoplasm of liver and intrahepatic bile duct: Secondary | ICD-10-CM | POA: Diagnosis not present

## 2017-12-06 DIAGNOSIS — I82401 Acute embolism and thrombosis of unspecified deep veins of right lower extremity: Secondary | ICD-10-CM | POA: Diagnosis not present

## 2017-12-06 DIAGNOSIS — I7100 Dissection of unspecified site of aorta: Secondary | ICD-10-CM | POA: Diagnosis not present

## 2017-12-06 DIAGNOSIS — N261 Atrophy of kidney (terminal): Secondary | ICD-10-CM | POA: Diagnosis not present

## 2017-12-06 DIAGNOSIS — C772 Secondary and unspecified malignant neoplasm of intra-abdominal lymph nodes: Secondary | ICD-10-CM | POA: Diagnosis not present

## 2017-12-06 DIAGNOSIS — D649 Anemia, unspecified: Secondary | ICD-10-CM | POA: Diagnosis not present

## 2017-12-06 DIAGNOSIS — G893 Neoplasm related pain (acute) (chronic): Secondary | ICD-10-CM | POA: Diagnosis not present

## 2017-12-06 DIAGNOSIS — L89152 Pressure ulcer of sacral region, stage 2: Secondary | ICD-10-CM | POA: Diagnosis not present

## 2017-12-06 DIAGNOSIS — N289 Disorder of kidney and ureter, unspecified: Secondary | ICD-10-CM | POA: Diagnosis not present

## 2017-12-06 MED ORDER — FLUCONAZOLE 100 MG PO TABS: 100.0000 mg | ORAL_TABLET | Freq: Every day | ORAL | 0 refills | Status: DC

## 2017-12-06 NOTE — Telephone Encounter (Signed)
[facility]. Continue leg elevation.  He can try support stockings as well. I will send a prescription to his pharmacy for Diflucan.

## 2017-12-06 NOTE — Telephone Encounter (Signed)
Beth: [phone] with [facility] left a message:  1. Pt fell yesterday at home- was being assisted by a caregiver. They think he broke his toe, taped it up as recommended by friend who is a physician. No other injury.  2. [facility] requesting PT order to assist with ambulation and weakness.  3. Pt continues to have 2+ pitting edema/swelling in lower extremity (mid shin to toes). Pt does keep leg elevated as much as possible.   4. Pt is requesting a refill of diflucan. Continues to have thrush in mouth and has yeast in groin and buttocks area.

## 2017-12-06 NOTE — Telephone Encounter (Signed)
Notified Beth with [facility]: Please make referral to [facility] for physical therapy. Continue leg elevation.  He can try support stockings as well. I will send a prescription to his pharmacy for Diflucan.

## 2017-12-07 ENCOUNTER — Telehealth: Payer: Self-pay | Admitting: Emergency Medicine

## 2017-12-07 DIAGNOSIS — C772 Secondary and unspecified malignant neoplasm of intra-abdominal lymph nodes: Secondary | ICD-10-CM | POA: Diagnosis not present

## 2017-12-07 DIAGNOSIS — D649 Anemia, unspecified: Secondary | ICD-10-CM | POA: Diagnosis not present

## 2017-12-07 DIAGNOSIS — K219 Gastro-esophageal reflux disease without esophagitis: Secondary | ICD-10-CM | POA: Diagnosis not present

## 2017-12-07 DIAGNOSIS — L89152 Pressure ulcer of sacral region, stage 2: Secondary | ICD-10-CM | POA: Diagnosis not present

## 2017-12-07 DIAGNOSIS — N289 Disorder of kidney and ureter, unspecified: Secondary | ICD-10-CM | POA: Diagnosis not present

## 2017-12-07 DIAGNOSIS — I82401 Acute embolism and thrombosis of unspecified deep veins of right lower extremity: Secondary | ICD-10-CM | POA: Diagnosis not present

## 2017-12-07 DIAGNOSIS — E119 Type 2 diabetes mellitus without complications: Secondary | ICD-10-CM | POA: Diagnosis not present

## 2017-12-07 DIAGNOSIS — C250 Malignant neoplasm of head of pancreas: Secondary | ICD-10-CM | POA: Diagnosis not present

## 2017-12-07 DIAGNOSIS — G893 Neoplasm related pain (acute) (chronic): Secondary | ICD-10-CM | POA: Diagnosis not present

## 2017-12-07 DIAGNOSIS — C787 Secondary malignant neoplasm of liver and intrahepatic bile duct: Secondary | ICD-10-CM | POA: Diagnosis not present

## 2017-12-07 DIAGNOSIS — Z4800 Encounter for change or removal of nonsurgical wound dressing: Secondary | ICD-10-CM | POA: Diagnosis not present

## 2017-12-07 DIAGNOSIS — I7100 Dissection of unspecified site of aorta: Secondary | ICD-10-CM | POA: Diagnosis not present

## 2017-12-07 DIAGNOSIS — N261 Atrophy of kidney (terminal): Secondary | ICD-10-CM | POA: Diagnosis not present

## 2017-12-07 NOTE — Telephone Encounter (Signed)
Verbal given to [facility] for physical therapy

## 2017-12-07 NOTE — Telephone Encounter (Signed)
VM from Madelynn Done, physical therapist w/ [facility]. She recommends pt has PT 2x/week for 4 weeks. She can take a verbal order. Message sent to Lisa,NP for order. Will follow up

## 2017-12-09 ENCOUNTER — Other Ambulatory Visit: Payer: Self-pay | Admitting: *Deleted

## 2017-12-09 DIAGNOSIS — C250 Malignant neoplasm of head of pancreas: Secondary | ICD-10-CM

## 2017-12-11 ENCOUNTER — Other Ambulatory Visit: Payer: Self-pay | Admitting: Oncology

## 2017-12-12 ENCOUNTER — Inpatient Hospital Stay: Payer: BLUE CROSS/BLUE SHIELD

## 2017-12-12 DIAGNOSIS — Z5111 Encounter for antineoplastic chemotherapy: Secondary | ICD-10-CM | POA: Diagnosis not present

## 2017-12-12 DIAGNOSIS — Z23 Encounter for immunization: Secondary | ICD-10-CM | POA: Diagnosis not present

## 2017-12-12 DIAGNOSIS — D649 Anemia, unspecified: Secondary | ICD-10-CM | POA: Diagnosis not present

## 2017-12-12 DIAGNOSIS — E119 Type 2 diabetes mellitus without complications: Secondary | ICD-10-CM | POA: Diagnosis not present

## 2017-12-12 DIAGNOSIS — N261 Atrophy of kidney (terminal): Secondary | ICD-10-CM | POA: Diagnosis not present

## 2017-12-12 DIAGNOSIS — Z95828 Presence of other vascular implants and grafts: Secondary | ICD-10-CM

## 2017-12-12 DIAGNOSIS — C250 Malignant neoplasm of head of pancreas: Secondary | ICD-10-CM | POA: Diagnosis not present

## 2017-12-12 DIAGNOSIS — Z7901 Long term (current) use of anticoagulants: Secondary | ICD-10-CM | POA: Diagnosis not present

## 2017-12-12 DIAGNOSIS — C787 Secondary malignant neoplasm of liver and intrahepatic bile duct: Secondary | ICD-10-CM | POA: Diagnosis not present

## 2017-12-12 DIAGNOSIS — G893 Neoplasm related pain (acute) (chronic): Secondary | ICD-10-CM | POA: Diagnosis not present

## 2017-12-12 DIAGNOSIS — C259 Malignant neoplasm of pancreas, unspecified: Secondary | ICD-10-CM | POA: Diagnosis not present

## 2017-12-12 DIAGNOSIS — I7100 Dissection of unspecified site of aorta: Secondary | ICD-10-CM | POA: Diagnosis not present

## 2017-12-12 DIAGNOSIS — N289 Disorder of kidney and ureter, unspecified: Secondary | ICD-10-CM | POA: Diagnosis not present

## 2017-12-12 DIAGNOSIS — Z452 Encounter for adjustment and management of vascular access device: Secondary | ICD-10-CM | POA: Diagnosis not present

## 2017-12-12 DIAGNOSIS — L89152 Pressure ulcer of sacral region, stage 2: Secondary | ICD-10-CM | POA: Diagnosis not present

## 2017-12-12 DIAGNOSIS — C772 Secondary and unspecified malignant neoplasm of intra-abdominal lymph nodes: Secondary | ICD-10-CM | POA: Diagnosis not present

## 2017-12-12 DIAGNOSIS — Z4800 Encounter for change or removal of nonsurgical wound dressing: Secondary | ICD-10-CM | POA: Diagnosis not present

## 2017-12-12 DIAGNOSIS — I82401 Acute embolism and thrombosis of unspecified deep veins of right lower extremity: Secondary | ICD-10-CM | POA: Diagnosis not present

## 2017-12-12 DIAGNOSIS — K219 Gastro-esophageal reflux disease without esophagitis: Secondary | ICD-10-CM | POA: Diagnosis not present

## 2017-12-12 LAB — CBC WITH DIFFERENTIAL (CANCER CENTER ONLY)
BASOS ABS: 0.2 10*3/uL — AB (ref 0.0–0.1)
BASOS PCT: 1 %
EOS ABS: 0.3 10*3/uL (ref 0.0–0.5)
EOS PCT: 1 %
HCT: 28.2 % — ABNORMAL LOW (ref 38.4–49.9)
Hemoglobin: 8.8 g/dL — ABNORMAL LOW (ref 13.0–17.1)
LYMPHS PCT: 7 %
Lymphs Abs: 1.6 10*3/uL (ref 0.9–3.3)
MCH: 27.5 pg (ref 27.2–33.4)
MCHC: 31.2 g/dL — ABNORMAL LOW (ref 32.0–36.0)
MCV: 87.9 fL (ref 79.3–98.0)
MONO ABS: 1.4 10*3/uL — AB (ref 0.1–0.9)
Monocytes Relative: 6 %
Neutro Abs: 19.7 10*3/uL — ABNORMAL HIGH (ref 1.5–6.5)
Neutrophils Relative %: 85 %
PLATELETS: 95 10*3/uL — AB (ref 140–400)
RBC: 3.21 MIL/uL — ABNORMAL LOW (ref 4.20–5.82)
RDW: 18.1 % — AB (ref 11.0–14.6)
WBC Count: 23.1 10*3/uL — ABNORMAL HIGH (ref 4.0–10.3)

## 2017-12-12 LAB — CMP (CANCER CENTER ONLY)
ALBUMIN: 2.1 g/dL — AB (ref 3.5–5.0)
ALK PHOS: 329 U/L — AB (ref 38–126)
ALT: 28 U/L (ref 0–44)
AST: 63 U/L — AB (ref 15–41)
Anion gap: 8 (ref 5–15)
BUN: 27 mg/dL — AB (ref 8–23)
CALCIUM: 8.3 mg/dL — AB (ref 8.9–10.3)
CO2: 20 mmol/L — ABNORMAL LOW (ref 22–32)
CREATININE: 1.39 mg/dL — AB (ref 0.61–1.24)
Chloride: 108 mmol/L (ref 98–111)
GFR, EST NON AFRICAN AMERICAN: 53 mL/min — AB (ref >60)
GFR, Est AFR Am: >60 mL/min (ref >60)
GLUCOSE: 103 mg/dL — AB (ref 70–99)
Potassium: 4.8 mmol/L (ref 3.5–5.1)
Sodium: 136 mmol/L (ref 135–145)
TOTAL PROTEIN: 5.7 g/dL — AB (ref 6.5–8.1)
Total Bilirubin: 1.2 mg/dL (ref 0.3–1.2)

## 2017-12-12 MED ORDER — SODIUM CHLORIDE 0.9% FLUSH
10.0000 mL | INTRAVENOUS | Status: DC | PRN
  Administered 2017-12-12: 10 mL
  Filled 2017-12-12: qty 10

## 2017-12-12 MED ORDER — HEPARIN SOD (PORK) LOCK FLUSH 100 UNIT/ML IV SOLN
500.0000 [IU] | Freq: Once | INTRAVENOUS | Status: AC | PRN
  Administered 2017-12-12: 500 [IU]
  Filled 2017-12-12: qty 5

## 2017-12-13 ENCOUNTER — Inpatient Hospital Stay: Payer: BLUE CROSS/BLUE SHIELD

## 2017-12-13 ENCOUNTER — Telehealth: Payer: Self-pay

## 2017-12-13 ENCOUNTER — Inpatient Hospital Stay: Payer: BLUE CROSS/BLUE SHIELD | Attending: Oncology | Admitting: Oncology

## 2017-12-13 VITALS — BP 118/74 | HR 86 | Temp 97.7°F | Resp 18 | Ht 67.0 in | Wt 152.8 lb

## 2017-12-13 DIAGNOSIS — Z86718 Personal history of other venous thrombosis and embolism: Secondary | ICD-10-CM | POA: Diagnosis not present

## 2017-12-13 DIAGNOSIS — D6959 Other secondary thrombocytopenia: Secondary | ICD-10-CM | POA: Diagnosis not present

## 2017-12-13 DIAGNOSIS — R609 Edema, unspecified: Secondary | ICD-10-CM | POA: Diagnosis not present

## 2017-12-13 DIAGNOSIS — C250 Malignant neoplasm of head of pancreas: Secondary | ICD-10-CM

## 2017-12-13 DIAGNOSIS — K1232 Oral mucositis (ulcerative) due to other drugs: Secondary | ICD-10-CM | POA: Insufficient documentation

## 2017-12-13 DIAGNOSIS — C259 Malignant neoplasm of pancreas, unspecified: Secondary | ICD-10-CM | POA: Insufficient documentation

## 2017-12-13 DIAGNOSIS — R531 Weakness: Secondary | ICD-10-CM | POA: Diagnosis not present

## 2017-12-13 DIAGNOSIS — Z5111 Encounter for antineoplastic chemotherapy: Secondary | ICD-10-CM | POA: Diagnosis not present

## 2017-12-13 DIAGNOSIS — L89159 Pressure ulcer of sacral region, unspecified stage: Secondary | ICD-10-CM | POA: Diagnosis not present

## 2017-12-13 DIAGNOSIS — C787 Secondary malignant neoplasm of liver and intrahepatic bile duct: Secondary | ICD-10-CM | POA: Insufficient documentation

## 2017-12-13 DIAGNOSIS — D72829 Elevated white blood cell count, unspecified: Secondary | ICD-10-CM | POA: Insufficient documentation

## 2017-12-13 MED ORDER — PALONOSETRON HCL INJECTION 0.25 MG/5ML
INTRAVENOUS | Status: AC
  Filled 2017-12-13: qty 5

## 2017-12-13 MED ORDER — SODIUM CHLORIDE 0.9 % IV SOLN
600.0000 mg/m2 | INTRAVENOUS | Status: DC
  Administered 2017-12-13: 1100 mg via INTRAVENOUS
  Filled 2017-12-13: qty 22

## 2017-12-13 MED ORDER — PALONOSETRON HCL INJECTION 0.25 MG/5ML
0.2500 mg | Freq: Once | INTRAVENOUS | Status: AC
  Administered 2017-12-13: 0.25 mg via INTRAVENOUS

## 2017-12-13 MED ORDER — DEXTROSE 5 % IV SOLN
Freq: Once | INTRAVENOUS | Status: AC
  Administered 2017-12-13: 10:00:00 via INTRAVENOUS
  Filled 2017-12-13: qty 250

## 2017-12-13 MED ORDER — DEXAMETHASONE SODIUM PHOSPHATE 10 MG/ML IJ SOLN
10.0000 mg | Freq: Once | INTRAMUSCULAR | Status: AC
  Administered 2017-12-13: 10 mg via INTRAVENOUS

## 2017-12-13 MED ORDER — SODIUM CHLORIDE 0.9% FLUSH
10.0000 mL | INTRAVENOUS | Status: DC | PRN
  Administered 2017-12-13: 10 mL
  Filled 2017-12-13: qty 10

## 2017-12-13 MED ORDER — OXALIPLATIN CHEMO INJECTION 100 MG/20ML
65.0000 mg/m2 | Freq: Once | INTRAVENOUS | Status: AC
  Administered 2017-12-13: 120 mg via INTRAVENOUS
  Filled 2017-12-13: qty 10

## 2017-12-13 MED ORDER — ATROPINE SULFATE 1 MG/ML IJ SOLN: 0.5000 mg | Freq: Once | INTRAMUSCULAR | Status: DC | PRN

## 2017-12-13 MED ORDER — DEXAMETHASONE SODIUM PHOSPHATE 10 MG/ML IJ SOLN
INTRAMUSCULAR | Status: AC
  Filled 2017-12-13: qty 1

## 2017-12-13 NOTE — Progress Notes (Signed)
[place] OFFICE PROGRESS NOTE   Diagnosis: Pancreas cancer  INTERVAL HISTORY:   [name] completed another cycle of FOLFOX beginning 11/29/2017.  He developed mouth erythema and "sores "following chemotherapy, but this was improved compared to the previous cycle of chemotherapy.  Magic mouthwash helped.  The mouth soreness did not interfere with eating. He has noted an improved appetite and decreased pain.  He is also noted decreased swelling over the abdominal wall.  His chief complaint is leg "weakness ".  He has not started physical therapy.  He is able to ambulate in the home. The sacral decubitus lesion is healing.  Objective:  Vital signs in last 24 hours:  Blood pressure 118/74, pulse 86, temperature 97.7 F (36.5 C), temperature source Oral, resp. rate 18, height 5' 7"  (1.702 m), weight 152 lb 12.8 oz (69.3 kg), SpO2 100 %.    HEENT: The mouth is dry.  No discrete ulcers.  Peeling of skin at the lower lip.  No thrush. Resp: Lungs clear bilaterally Cardio: Regular rate and rhythm GI: No hepatomegaly, tender in the right upper abdomen, no mass Vascular: Trace pitting edema at the lower leg and foot bilaterally, mild abdominal wall edema  Skin: Healing superficial decubitus at the upper gluteal fold  Portacath/PICC-without erythema  Lab Results:  Lab Results  Component Value Date   WBC 23.1 (H) 12/12/2017   HGB 8.8 (L) 12/12/2017   HCT 28.2 (L) 12/12/2017   MCV 87.9 12/12/2017   PLT 95 (L) 12/12/2017   NEUTROABS 19.7 (H) 12/12/2017    CMP  Lab Results  Component Value Date   NA 136 12/12/2017   K 4.8 12/12/2017   CL 108 12/12/2017   CO2 20 (L) 12/12/2017   GLUCOSE 103 (H) 12/12/2017   BUN 27 (H) 12/12/2017   CREATININE 1.39 (H) 12/12/2017   CALCIUM 8.3 (L) 12/12/2017   PROT 5.7 (L) 12/12/2017   ALBUMIN 2.1 (L) 12/12/2017   AST 63 (H) 12/12/2017   ALT 28 12/12/2017   ALKPHOS 329 (H) 12/12/2017   BILITOT 1.2 12/12/2017   GFRNONAA 53  (L) 12/12/2017   GFRAA >60 12/12/2017    Lab Results  Component Value Date   CEA1 5.3 (H) 10/09/2017     Medications: I have reviewed the patient's current medications.   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 7/11/2019clinical and radiologic presentation most consistent with pancreas cancer  CT abdomen/pelvis 09/20/2017-new hypodense liver lesions, gallbladder distention with intra-and extrahepatic biliary dilatation  ERCP 09/21/2017 and 09/23/2017, distal common duct stricture, status post placement of ridging, duct stents, cytology from biliary brushing-nondiagnostic  Ultrasound-guided biopsy of a right liver lesion 09/22/2017, metastatic poorly differentiated carcinoma, MSI-stable, tumor mutation burden 5, KRAS G12R, KEIT amplification, PDGFRA amplification  CT angiogram chest, abdomen, and pelvis 09/30/2017-ill-defined soft tissue in the uncinate process, stable type B aortic dissection, distended gallbladder, multiple hepatic metastases, retroperitoneal and periportal adenopathy, infarcted/atrophic left kidney  PET scan 11/03/2017- numerous liver metastases, hypermetabolic mass at the pancreas uncinate and head, increased metabolic activity of the umbilicus, right rectus abdominis, right abdominal pannus  Cycle 1 FOLFOX 11/15/2017  Cycle 2 FOLFOX 11/29/2017 (5-FU dose reduced due to mucositis)  Cycle 3 FOLFOX 12/13/2017  2.Acute cholecystitis, status post a cholecystectomy 10/03/2017  Gallbladder pathology-acute and chronic cholecystitis with cholelithiasis; no evidence of malignancy  Pathology on cystic duct lymph node-metastatic carcinoma;CK7, PAX-8, CD10 and p63 positive;CK 20, GATA-3, CDX-2 and CD117 negative 3.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 Pain-likely secondary to cholecystitisandthe cholecystectomy versus metastatic carcinoma;MS Contin 15 mg every 12 hours beginning 10/20/2017, Dilaudid as needed 12.Right lower extremity DVT 10/28/2017- treated with Xarelto 13.  Port-A-Cath placement 11/23/2017 14.  Significant mucositis following cycle 1 FOLFOX.  5-FU dose reduced beginning with cycle 2.    Disposition: [name] appears unchanged.  He tolerated the second cycle of FOLFOX with less mucositis.  His performance status remains borderline.  I do not recommend adding irinotecan to the chemotherapy regimen.  He will complete another cycle of FOLFOX today.  The leukocytosis is likely related to a leukemoid reaction from metastatic cancer and recovery from the recent course of chemotherapy.  He has mild thrombocytopenia.  The oxalic plan will be dose reduced with this cycle.  [name] will continue the current pain regimen.  He will begin home physical therapy.  [name] will return for an office visit and chemotherapy in 2 weeks.  We we will plan for a restaging CT evaluation after 5 cycles of chemotherapy.  Betsy Coder, MD  12/13/2017  9:24 AM

## 2017-12-13 NOTE — Patient Instructions (Addendum)
[place] Discharge Instructions for Patients Receiving Chemotherapy  Today you received the following chemotherapy agents:  Oxaliplatin, leucovorin, irinotecan, and 5FU.  To help prevent nausea and vomiting after your treatment, we encourage you to take your nausea medication as directed.   If you develop nausea and vomiting that is not controlled by your nausea medication, call the clinic.   BELOW ARE SYMPTOMS THAT SHOULD BE REPORTED IMMEDIATELY:  *FEVER GREATER THAN 100.5 F  *CHILLS WITH OR WITHOUT FEVER  NAUSEA AND VOMITING THAT IS NOT CONTROLLED WITH YOUR NAUSEA MEDICATION  *UNUSUAL SHORTNESS OF BREATH  *UNUSUAL BRUISING OR BLEEDING  TENDERNESS IN MOUTH AND THROAT WITH OR WITHOUT PRESENCE OF ULCERS  *URINARY PROBLEMS  *BOWEL PROBLEMS  UNUSUAL RASH Items with * indicate a potential emergency and should be followed up as soon as possible.  Feel free to call the clinic should you have any questions or concerns. The clinic phone number is [phone].  Please show the [name] at check-in to the Emergency Department and triage nurse.

## 2017-12-13 NOTE — Telephone Encounter (Signed)
Printed avs and calender of upcoming appointment. Per 410/1 los

## 2017-12-14 ENCOUNTER — Telehealth: Payer: Self-pay | Admitting: *Deleted

## 2017-12-14 DIAGNOSIS — D649 Anemia, unspecified: Secondary | ICD-10-CM | POA: Diagnosis not present

## 2017-12-14 DIAGNOSIS — C250 Malignant neoplasm of head of pancreas: Secondary | ICD-10-CM

## 2017-12-14 DIAGNOSIS — C787 Secondary malignant neoplasm of liver and intrahepatic bile duct: Secondary | ICD-10-CM | POA: Diagnosis not present

## 2017-12-14 DIAGNOSIS — C772 Secondary and unspecified malignant neoplasm of intra-abdominal lymph nodes: Secondary | ICD-10-CM | POA: Diagnosis not present

## 2017-12-14 DIAGNOSIS — I7100 Dissection of unspecified site of aorta: Secondary | ICD-10-CM | POA: Diagnosis not present

## 2017-12-14 DIAGNOSIS — N261 Atrophy of kidney (terminal): Secondary | ICD-10-CM | POA: Diagnosis not present

## 2017-12-14 DIAGNOSIS — I82401 Acute embolism and thrombosis of unspecified deep veins of right lower extremity: Secondary | ICD-10-CM | POA: Diagnosis not present

## 2017-12-14 DIAGNOSIS — E119 Type 2 diabetes mellitus without complications: Secondary | ICD-10-CM | POA: Diagnosis not present

## 2017-12-14 DIAGNOSIS — K219 Gastro-esophageal reflux disease without esophagitis: Secondary | ICD-10-CM | POA: Diagnosis not present

## 2017-12-14 DIAGNOSIS — G893 Neoplasm related pain (acute) (chronic): Secondary | ICD-10-CM | POA: Diagnosis not present

## 2017-12-14 DIAGNOSIS — Z4800 Encounter for change or removal of nonsurgical wound dressing: Secondary | ICD-10-CM | POA: Diagnosis not present

## 2017-12-14 DIAGNOSIS — N289 Disorder of kidney and ureter, unspecified: Secondary | ICD-10-CM | POA: Diagnosis not present

## 2017-12-14 DIAGNOSIS — L89152 Pressure ulcer of sacral region, stage 2: Secondary | ICD-10-CM | POA: Diagnosis not present

## 2017-12-14 NOTE — Telephone Encounter (Signed)
Patient's daughter called with concerns patient is continuing to have falls at home. Leg weakness is not improving. Primary care giver is having difficulty maintaining care at home for him. She is also leaving for out of town trip for 1 week. Concern is raised that patient is not able to stay home alone while she is gone. Other family members are not able to assume care and would like to find out about a SNF. [facility] goes to the home weekly for wound care and PT is currently scheduled for 2 days a week. Social work called to possibly offer additional services.

## 2017-12-15 ENCOUNTER — Inpatient Hospital Stay: Payer: BLUE CROSS/BLUE SHIELD

## 2017-12-15 ENCOUNTER — Encounter: Payer: Self-pay | Admitting: General Practice

## 2017-12-15 ENCOUNTER — Other Ambulatory Visit: Payer: Self-pay | Admitting: *Deleted

## 2017-12-15 ENCOUNTER — Telehealth: Payer: Self-pay | Admitting: Oncology

## 2017-12-15 VITALS — BP 105/71 | HR 90 | Temp 98.2°F | Resp 18

## 2017-12-15 DIAGNOSIS — Z86718 Personal history of other venous thrombosis and embolism: Secondary | ICD-10-CM | POA: Diagnosis not present

## 2017-12-15 DIAGNOSIS — C259 Malignant neoplasm of pancreas, unspecified: Secondary | ICD-10-CM | POA: Diagnosis not present

## 2017-12-15 DIAGNOSIS — D6959 Other secondary thrombocytopenia: Secondary | ICD-10-CM | POA: Diagnosis not present

## 2017-12-15 DIAGNOSIS — C787 Secondary malignant neoplasm of liver and intrahepatic bile duct: Secondary | ICD-10-CM | POA: Diagnosis not present

## 2017-12-15 DIAGNOSIS — C250 Malignant neoplasm of head of pancreas: Secondary | ICD-10-CM

## 2017-12-15 DIAGNOSIS — R531 Weakness: Secondary | ICD-10-CM | POA: Diagnosis not present

## 2017-12-15 DIAGNOSIS — Z5111 Encounter for antineoplastic chemotherapy: Secondary | ICD-10-CM | POA: Diagnosis not present

## 2017-12-15 DIAGNOSIS — C799 Secondary malignant neoplasm of unspecified site: Secondary | ICD-10-CM

## 2017-12-15 DIAGNOSIS — L89159 Pressure ulcer of sacral region, unspecified stage: Secondary | ICD-10-CM | POA: Diagnosis not present

## 2017-12-15 DIAGNOSIS — K1232 Oral mucositis (ulcerative) due to other drugs: Secondary | ICD-10-CM | POA: Diagnosis not present

## 2017-12-15 DIAGNOSIS — R160 Hepatomegaly, not elsewhere classified: Secondary | ICD-10-CM

## 2017-12-15 DIAGNOSIS — D72829 Elevated white blood cell count, unspecified: Secondary | ICD-10-CM | POA: Diagnosis not present

## 2017-12-15 DIAGNOSIS — R609 Edema, unspecified: Secondary | ICD-10-CM | POA: Diagnosis not present

## 2017-12-15 MED ORDER — SODIUM CHLORIDE 0.9% FLUSH
10.0000 mL | INTRAVENOUS | Status: DC | PRN
  Administered 2017-12-15: 10 mL
  Filled 2017-12-15: qty 10

## 2017-12-15 MED ORDER — HEPARIN SOD (PORK) LOCK FLUSH 100 UNIT/ML IV SOLN
500.0000 [IU] | Freq: Once | INTRAVENOUS | Status: AC | PRN
  Administered 2017-12-15: 500 [IU]
  Filled 2017-12-15: qty 5

## 2017-12-15 NOTE — Telephone Encounter (Signed)
R/s appt per 10/3 sch message - pt is aware of appt date and time.

## 2017-12-15 NOTE — Progress Notes (Signed)
[place] CSW Progress Notes  Call received from Judson Roch, Dr Sport and exercise psychologist.  Family concerned about how to care for patient when family members are unavailable.  Want information on respite care.  CSW attempted to call family members identified Altha Harm [phone]).  When family member returns call, CSW can provide information about respite care options including [place], [place].  All facilities are Assisted Living programs that are willing to admit residents for short stays for respite care purposes.  Family can also be referred to [facility] [employer] - agency provides service "Caregiver on Call" which allows for quick access to sitters in the home.  All services are private pay.  Awaiting return call from family member.  Edwyna Shell, LCSW Clinical Social Worker Phone:  [phone]

## 2017-12-16 ENCOUNTER — Other Ambulatory Visit: Payer: Self-pay | Admitting: Oncology

## 2017-12-16 DIAGNOSIS — D649 Anemia, unspecified: Secondary | ICD-10-CM | POA: Diagnosis not present

## 2017-12-16 DIAGNOSIS — L89152 Pressure ulcer of sacral region, stage 2: Secondary | ICD-10-CM | POA: Diagnosis not present

## 2017-12-16 DIAGNOSIS — N289 Disorder of kidney and ureter, unspecified: Secondary | ICD-10-CM | POA: Diagnosis not present

## 2017-12-16 DIAGNOSIS — E119 Type 2 diabetes mellitus without complications: Secondary | ICD-10-CM | POA: Diagnosis not present

## 2017-12-16 DIAGNOSIS — C787 Secondary malignant neoplasm of liver and intrahepatic bile duct: Secondary | ICD-10-CM | POA: Diagnosis not present

## 2017-12-19 DIAGNOSIS — C259 Malignant neoplasm of pancreas, unspecified: Secondary | ICD-10-CM | POA: Diagnosis not present

## 2017-12-19 DIAGNOSIS — Z4800 Encounter for change or removal of nonsurgical wound dressing: Secondary | ICD-10-CM | POA: Diagnosis not present

## 2017-12-19 DIAGNOSIS — N289 Disorder of kidney and ureter, unspecified: Secondary | ICD-10-CM | POA: Diagnosis not present

## 2017-12-19 DIAGNOSIS — G893 Neoplasm related pain (acute) (chronic): Secondary | ICD-10-CM | POA: Diagnosis not present

## 2017-12-19 DIAGNOSIS — R531 Weakness: Secondary | ICD-10-CM | POA: Diagnosis not present

## 2017-12-19 DIAGNOSIS — C772 Secondary and unspecified malignant neoplasm of intra-abdominal lymph nodes: Secondary | ICD-10-CM | POA: Diagnosis not present

## 2017-12-19 DIAGNOSIS — N261 Atrophy of kidney (terminal): Secondary | ICD-10-CM | POA: Diagnosis not present

## 2017-12-19 DIAGNOSIS — C250 Malignant neoplasm of head of pancreas: Secondary | ICD-10-CM | POA: Diagnosis not present

## 2017-12-19 DIAGNOSIS — I7100 Dissection of unspecified site of aorta: Secondary | ICD-10-CM | POA: Diagnosis not present

## 2017-12-19 DIAGNOSIS — D649 Anemia, unspecified: Secondary | ICD-10-CM | POA: Diagnosis not present

## 2017-12-19 DIAGNOSIS — L89152 Pressure ulcer of sacral region, stage 2: Secondary | ICD-10-CM | POA: Diagnosis not present

## 2017-12-19 DIAGNOSIS — E119 Type 2 diabetes mellitus without complications: Secondary | ICD-10-CM | POA: Diagnosis not present

## 2017-12-19 DIAGNOSIS — K219 Gastro-esophageal reflux disease without esophagitis: Secondary | ICD-10-CM | POA: Diagnosis not present

## 2017-12-19 DIAGNOSIS — C787 Secondary malignant neoplasm of liver and intrahepatic bile duct: Secondary | ICD-10-CM | POA: Diagnosis not present

## 2017-12-19 DIAGNOSIS — I82401 Acute embolism and thrombosis of unspecified deep veins of right lower extremity: Secondary | ICD-10-CM | POA: Diagnosis not present

## 2017-12-21 ENCOUNTER — Other Ambulatory Visit: Payer: Self-pay | Admitting: *Deleted

## 2017-12-21 ENCOUNTER — Telehealth: Payer: Self-pay | Admitting: *Deleted

## 2017-12-21 DIAGNOSIS — I7100 Dissection of unspecified site of aorta: Secondary | ICD-10-CM | POA: Diagnosis not present

## 2017-12-21 DIAGNOSIS — K219 Gastro-esophageal reflux disease without esophagitis: Secondary | ICD-10-CM | POA: Diagnosis not present

## 2017-12-21 DIAGNOSIS — L89152 Pressure ulcer of sacral region, stage 2: Secondary | ICD-10-CM | POA: Diagnosis not present

## 2017-12-21 DIAGNOSIS — D649 Anemia, unspecified: Secondary | ICD-10-CM | POA: Diagnosis not present

## 2017-12-21 DIAGNOSIS — I82401 Acute embolism and thrombosis of unspecified deep veins of right lower extremity: Secondary | ICD-10-CM | POA: Diagnosis not present

## 2017-12-21 DIAGNOSIS — G893 Neoplasm related pain (acute) (chronic): Secondary | ICD-10-CM | POA: Diagnosis not present

## 2017-12-21 DIAGNOSIS — E119 Type 2 diabetes mellitus without complications: Secondary | ICD-10-CM | POA: Diagnosis not present

## 2017-12-21 DIAGNOSIS — Z4800 Encounter for change or removal of nonsurgical wound dressing: Secondary | ICD-10-CM | POA: Diagnosis not present

## 2017-12-21 DIAGNOSIS — C799 Secondary malignant neoplasm of unspecified site: Secondary | ICD-10-CM

## 2017-12-21 DIAGNOSIS — N261 Atrophy of kidney (terminal): Secondary | ICD-10-CM | POA: Diagnosis not present

## 2017-12-21 DIAGNOSIS — C787 Secondary malignant neoplasm of liver and intrahepatic bile duct: Secondary | ICD-10-CM | POA: Diagnosis not present

## 2017-12-21 DIAGNOSIS — N289 Disorder of kidney and ureter, unspecified: Secondary | ICD-10-CM | POA: Diagnosis not present

## 2017-12-21 DIAGNOSIS — C772 Secondary and unspecified malignant neoplasm of intra-abdominal lymph nodes: Secondary | ICD-10-CM | POA: Diagnosis not present

## 2017-12-21 DIAGNOSIS — C250 Malignant neoplasm of head of pancreas: Secondary | ICD-10-CM | POA: Diagnosis not present

## 2017-12-21 MED ORDER — HYDROMORPHONE HCL 4 MG PO TABS: 4.0000 mg | ORAL_TABLET | ORAL | 0 refills | Status: DC | PRN

## 2017-12-21 MED ORDER — MORPHINE SULFATE ER 15 MG PO TBCR: 15.0000 mg | EXTENDED_RELEASE_TABLET | Freq: Two times a day (BID) | ORAL | 0 refills | Status: DC

## 2017-12-21 NOTE — Telephone Encounter (Signed)
Spoke with Jerry Weiss's daughter Jerry Weiss [phone])  To further assess right sided pain reported by therapist.  Refils needed for MS Contin 15 mg, one every 12 hrs, quantity = 60; Hydromorphone 4 mg take one every 4 hrs as needed, quantity = 75 last ordered 11-25-2017.  Call Jerry Weiss when ready for pick-up.  Denies further needs or questions at this time.      "He cut down using Hydromorphone.  Not needed it as much until experiencing more pain yesterday.  Using more as we slowly increase use back to what he was taking is why we need a refill.  No other symptoms, just needs refills."  "Jerry Weiss PT, [place].  Jerry Weiss experiencing pain to his side.  Worse last night; today rates as eight out of ten.  No relief with pain medications: Dilaudid, MS Contin 15 and muscle relaxant Robaxin. Two days ago he was pale; today he looks jaundiced.  Daughter Jerry Weiss [phone]) with him now should know how many pills he's used in the past 24 hrs."

## 2017-12-22 ENCOUNTER — Telehealth: Payer: Self-pay | Admitting: General Practice

## 2017-12-22 NOTE — Telephone Encounter (Signed)
[place] CSW Progress Notes  Call received from sister, Minette [place] Card, wants help w information on resources to support patient in home.  Lives alone, receives widowers social security, has Pharmacist, community but not Medicaid.  Family and church members have been helping w care needs - family would like any options for additional support.  Family has spoken w Nurse, children's - given list of home health aide resources.  Are also followed by [facility] for home health RN and PT.  CSW discussed option of private pay services for home care needs and also recommended family speak w DSS to determine if patient is Medicaid eligible as this would expand options.  Can refer patient to [facility] for disability application; however this may not provide additional resources as patient is already receiving social security.  Also discussed option of respite care at local ALFs that provide this service for 14 - 30 days. Will mail information on caregiver support program, encouraged sister to reach out as needed for resources/support.  Edwyna Shell, LCSW Clinical Social Worker Phone:  [phone]

## 2017-12-23 ENCOUNTER — Other Ambulatory Visit: Payer: Self-pay | Admitting: Emergency Medicine

## 2017-12-23 DIAGNOSIS — C250 Malignant neoplasm of head of pancreas: Secondary | ICD-10-CM | POA: Diagnosis not present

## 2017-12-23 DIAGNOSIS — G893 Neoplasm related pain (acute) (chronic): Secondary | ICD-10-CM | POA: Diagnosis not present

## 2017-12-23 DIAGNOSIS — E119 Type 2 diabetes mellitus without complications: Secondary | ICD-10-CM | POA: Diagnosis not present

## 2017-12-23 DIAGNOSIS — N289 Disorder of kidney and ureter, unspecified: Secondary | ICD-10-CM | POA: Diagnosis not present

## 2017-12-23 DIAGNOSIS — N261 Atrophy of kidney (terminal): Secondary | ICD-10-CM | POA: Diagnosis not present

## 2017-12-23 DIAGNOSIS — D649 Anemia, unspecified: Secondary | ICD-10-CM | POA: Diagnosis not present

## 2017-12-23 DIAGNOSIS — I7100 Dissection of unspecified site of aorta: Secondary | ICD-10-CM | POA: Diagnosis not present

## 2017-12-23 DIAGNOSIS — C772 Secondary and unspecified malignant neoplasm of intra-abdominal lymph nodes: Secondary | ICD-10-CM | POA: Diagnosis not present

## 2017-12-23 DIAGNOSIS — I82401 Acute embolism and thrombosis of unspecified deep veins of right lower extremity: Secondary | ICD-10-CM | POA: Diagnosis not present

## 2017-12-23 DIAGNOSIS — L89152 Pressure ulcer of sacral region, stage 2: Secondary | ICD-10-CM | POA: Diagnosis not present

## 2017-12-23 DIAGNOSIS — C787 Secondary malignant neoplasm of liver and intrahepatic bile duct: Secondary | ICD-10-CM | POA: Diagnosis not present

## 2017-12-23 DIAGNOSIS — K219 Gastro-esophageal reflux disease without esophagitis: Secondary | ICD-10-CM | POA: Diagnosis not present

## 2017-12-23 DIAGNOSIS — Z4800 Encounter for change or removal of nonsurgical wound dressing: Secondary | ICD-10-CM | POA: Diagnosis not present

## 2017-12-23 MED ORDER — LORAZEPAM 0.5 MG PO TABS: 0.5000 mg | ORAL_TABLET | Freq: Four times a day (QID) | ORAL | 0 refills | Status: AC | PRN

## 2017-12-25 ENCOUNTER — Encounter (HOSPITAL_COMMUNITY): Payer: Self-pay | Admitting: Emergency Medicine

## 2017-12-25 ENCOUNTER — Other Ambulatory Visit: Payer: Self-pay

## 2017-12-25 ENCOUNTER — Inpatient Hospital Stay (HOSPITAL_COMMUNITY)
Admission: EM | Admit: 2017-12-25 | Discharge: 2017-12-28 | DRG: 683 | Disposition: A | Discharge status: Hospice | Payer: BLUE CROSS/BLUE SHIELD | Attending: Internal Medicine | Admitting: Internal Medicine

## 2017-12-25 DIAGNOSIS — R627 Adult failure to thrive: Secondary | ICD-10-CM

## 2017-12-25 DIAGNOSIS — C7901 Secondary malignant neoplasm of right kidney and renal pelvis: Secondary | ICD-10-CM | POA: Diagnosis not present

## 2017-12-25 DIAGNOSIS — Z808 Family history of malignant neoplasm of other organs or systems: Secondary | ICD-10-CM

## 2017-12-25 DIAGNOSIS — Z905 Acquired absence of kidney: Secondary | ICD-10-CM | POA: Diagnosis not present

## 2017-12-25 DIAGNOSIS — C787 Secondary malignant neoplasm of liver and intrahepatic bile duct: Secondary | ICD-10-CM | POA: Diagnosis not present

## 2017-12-25 DIAGNOSIS — R338 Other retention of urine: Secondary | ICD-10-CM | POA: Diagnosis present

## 2017-12-25 DIAGNOSIS — D6959 Other secondary thrombocytopenia: Secondary | ICD-10-CM | POA: Diagnosis present

## 2017-12-25 DIAGNOSIS — Z66 Do not resuscitate: Secondary | ICD-10-CM | POA: Diagnosis present

## 2017-12-25 DIAGNOSIS — R279 Unspecified lack of coordination: Secondary | ICD-10-CM | POA: Diagnosis not present

## 2017-12-25 DIAGNOSIS — C250 Malignant neoplasm of head of pancreas: Secondary | ICD-10-CM | POA: Diagnosis not present

## 2017-12-25 DIAGNOSIS — D649 Anemia, unspecified: Secondary | ICD-10-CM | POA: Diagnosis present

## 2017-12-25 DIAGNOSIS — G4733 Obstructive sleep apnea (adult) (pediatric): Secondary | ICD-10-CM | POA: Diagnosis not present

## 2017-12-25 DIAGNOSIS — Z7901 Long term (current) use of anticoagulants: Secondary | ICD-10-CM | POA: Diagnosis not present

## 2017-12-25 DIAGNOSIS — K219 Gastro-esophageal reflux disease without esophagitis: Secondary | ICD-10-CM | POA: Diagnosis present

## 2017-12-25 DIAGNOSIS — Z833 Family history of diabetes mellitus: Secondary | ICD-10-CM

## 2017-12-25 DIAGNOSIS — K5090 Crohn's disease, unspecified, without complications: Secondary | ICD-10-CM | POA: Diagnosis present

## 2017-12-25 DIAGNOSIS — E119 Type 2 diabetes mellitus without complications: Secondary | ICD-10-CM

## 2017-12-25 DIAGNOSIS — N179 Acute kidney failure, unspecified: Principal | ICD-10-CM | POA: Diagnosis present

## 2017-12-25 DIAGNOSIS — Z9221 Personal history of antineoplastic chemotherapy: Secondary | ICD-10-CM | POA: Diagnosis not present

## 2017-12-25 DIAGNOSIS — E860 Dehydration: Secondary | ICD-10-CM

## 2017-12-25 DIAGNOSIS — R112 Nausea with vomiting, unspecified: Secondary | ICD-10-CM | POA: Diagnosis not present

## 2017-12-25 DIAGNOSIS — Z85528 Personal history of other malignant neoplasm of kidney: Secondary | ICD-10-CM

## 2017-12-25 DIAGNOSIS — I959 Hypotension, unspecified: Secondary | ICD-10-CM | POA: Diagnosis present

## 2017-12-25 DIAGNOSIS — Z86718 Personal history of other venous thrombosis and embolism: Secondary | ICD-10-CM | POA: Diagnosis not present

## 2017-12-25 DIAGNOSIS — T451X5A Adverse effect of antineoplastic and immunosuppressive drugs, initial encounter: Secondary | ICD-10-CM | POA: Diagnosis present

## 2017-12-25 DIAGNOSIS — G893 Neoplasm related pain (acute) (chronic): Secondary | ICD-10-CM | POA: Diagnosis present

## 2017-12-25 DIAGNOSIS — I82401 Acute embolism and thrombosis of unspecified deep veins of right lower extremity: Secondary | ICD-10-CM | POA: Diagnosis not present

## 2017-12-25 DIAGNOSIS — N401 Enlarged prostate with lower urinary tract symptoms: Secondary | ICD-10-CM | POA: Diagnosis present

## 2017-12-25 DIAGNOSIS — Z79899 Other long term (current) drug therapy: Secondary | ICD-10-CM | POA: Diagnosis not present

## 2017-12-25 DIAGNOSIS — K1230 Oral mucositis (ulcerative), unspecified: Secondary | ICD-10-CM | POA: Diagnosis present

## 2017-12-25 DIAGNOSIS — N2889 Other specified disorders of kidney and ureter: Secondary | ICD-10-CM | POA: Diagnosis not present

## 2017-12-25 DIAGNOSIS — Z743 Need for continuous supervision: Secondary | ICD-10-CM | POA: Diagnosis not present

## 2017-12-25 DIAGNOSIS — Z7984 Long term (current) use of oral hypoglycemic drugs: Secondary | ICD-10-CM | POA: Diagnosis not present

## 2017-12-25 DIAGNOSIS — Z515 Encounter for palliative care: Secondary | ICD-10-CM | POA: Diagnosis present

## 2017-12-25 DIAGNOSIS — R0902 Hypoxemia: Secondary | ICD-10-CM | POA: Diagnosis not present

## 2017-12-25 DIAGNOSIS — I7100 Dissection of unspecified site of aorta: Secondary | ICD-10-CM | POA: Diagnosis not present

## 2017-12-25 DIAGNOSIS — I10 Essential (primary) hypertension: Secondary | ICD-10-CM | POA: Diagnosis present

## 2017-12-25 DIAGNOSIS — R52 Pain, unspecified: Secondary | ICD-10-CM | POA: Diagnosis not present

## 2017-12-25 DIAGNOSIS — C259 Malignant neoplasm of pancreas, unspecified: Secondary | ICD-10-CM | POA: Diagnosis not present

## 2017-12-25 DIAGNOSIS — R531 Weakness: Secondary | ICD-10-CM | POA: Diagnosis not present

## 2017-12-25 LAB — COMPREHENSIVE METABOLIC PANEL
ALBUMIN: 1.6 g/dL — AB (ref 3.5–5.0)
ALT: 22 U/L (ref 0–44)
AST: 52 U/L — AB (ref 15–41)
Alkaline Phosphatase: 361 U/L — ABNORMAL HIGH (ref 38–126)
Anion gap: 8 (ref 5–15)
BILIRUBIN TOTAL: 2.6 mg/dL — AB (ref 0.3–1.2)
BUN: 42 mg/dL — AB (ref 8–23)
CALCIUM: 8.4 mg/dL — AB (ref 8.9–10.3)
CO2: 20 mmol/L — AB (ref 22–32)
Chloride: 112 mmol/L — ABNORMAL HIGH (ref 98–111)
Creatinine, Ser: 1.67 mg/dL — ABNORMAL HIGH (ref 0.61–1.24)
GFR calc Af Amer: 49 mL/min — ABNORMAL LOW (ref >60)
GFR calc non Af Amer: 42 mL/min — ABNORMAL LOW (ref >60)
GLUCOSE: 99 mg/dL (ref 70–99)
Potassium: 4.3 mmol/L (ref 3.5–5.1)
SODIUM: 140 mmol/L (ref 135–145)
TOTAL PROTEIN: 4.9 g/dL — AB (ref 6.5–8.1)

## 2017-12-25 LAB — CBC WITH DIFFERENTIAL/PLATELET
Abs Immature Granulocytes: 0.15 10*3/uL — ABNORMAL HIGH (ref 0.00–0.07)
Basophils Absolute: 0.1 10*3/uL (ref 0.0–0.1)
Basophils Relative: 0 %
EOS ABS: 0.3 10*3/uL (ref 0.0–0.5)
Eosinophils Relative: 2 %
HEMATOCRIT: 27.1 % — AB (ref 39.0–52.0)
HEMOGLOBIN: 8 g/dL — AB (ref 13.0–17.0)
IMMATURE GRANULOCYTES: 1 %
Lymphocytes Relative: 12 %
Lymphs Abs: 2.3 10*3/uL (ref 0.7–4.0)
MCH: 28.1 pg (ref 26.0–34.0)
MCHC: 29.5 g/dL — ABNORMAL LOW (ref 30.0–36.0)
MCV: 95.1 fL (ref 80.0–100.0)
MONOS PCT: 5 %
Monocytes Absolute: 1.1 10*3/uL — ABNORMAL HIGH (ref 0.1–1.0)
NEUTROS ABS: 15.6 10*3/uL — AB (ref 1.7–7.7)
NEUTROS PCT: 80 %
NRBC: 0.1 % (ref 0.0–0.2)
Platelets: 46 10*3/uL — ABNORMAL LOW (ref 150–400)
RBC: 2.85 MIL/uL — ABNORMAL LOW (ref 4.22–5.81)
RDW: 21.9 % — ABNORMAL HIGH (ref 11.5–15.5)
WBC: 19.5 10*3/uL — ABNORMAL HIGH (ref 4.0–10.5)

## 2017-12-25 LAB — LIPASE, BLOOD: Lipase: 19 U/L (ref 11–51)

## 2017-12-25 MED ORDER — SODIUM CHLORIDE 0.9 % IV BOLUS
1000.0000 mL | Freq: Once | INTRAVENOUS | Status: AC
  Administered 2017-12-25: 1000 mL via INTRAVENOUS

## 2017-12-25 MED ORDER — SODIUM CHLORIDE 0.9 % IV SOLN
INTRAVENOUS | Status: DC
  Administered 2017-12-25: 21:00:00 via INTRAVENOUS

## 2017-12-25 NOTE — ED Notes (Signed)
Requested urine from patient. 

## 2017-12-25 NOTE — ED Notes (Signed)
Pt. Made aware for the need of urine specimen. 

## 2017-12-25 NOTE — ED Notes (Signed)
ED TO INPATIENT HANDOFF REPORT  Name/Age/Gender Jerry Weiss 62 y.o. male  Code Status Code Status History    Date Active Date Inactive Code Status Order ID Comments User Context   09/30/2017 2315 10/14/2017 1651 Full Code [id number]  Vianne Bulls, MD ED   09/20/2017 2013 09/26/2017 1505 Full Code [id number]  Etta Quill, DO ED   09/08/2017 0917 09/09/2017 1201 Full Code [id number]  Irine Seal, MD Inpatient   07/28/2017 1722 07/31/2017 2358 Full Code [id number]  Raynelle Bring, MD Inpatient      Home/SNF/Other Home  Chief Complaint Generalized Weakness   Level of Care/Admitting Diagnosis ED Disposition    ED Disposition Condition Comment   [place]: [facility] [[id number]]  Level of Care: Med-Surg [16]  Diagnosis: Acute renal failure (ARF) [facility]) [341962]  Admitting Physician: Rise Patience [phone]  Attending Physician: Rise Patience Lei.Right  PT Class (Do Not Modify): Observation [104]  PT Acc Code (Do Not Modify): Observation [10022]       Medical History Past Medical History:  Diagnosis Date  . Anemia    mild  . 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 GERD (gastroesophageal reflux disease)   . Hypertension   . Inguinal hernia    Bilateral age 17  . Leg muscle spasm    both legs occ  . OSA (obstructive sleep apnea)    Wears CPAP at night  . Pneumonia    history of x3  . Renal cancer, right ([place])   . Renal infarct ([place]) 03/06/2017   Left   . Renal mass, right   . Status post dilation of esophageal narrowing   . Umbilical hernia    Small noted on CT 05/26/2017  . Ventral hernia    noted on CT 05/26/2017    Allergies Allergies  Allergen Reactions  . Ciprofloxacin Other (See Comments)    Unknown  . Codeine Other (See Comments)    Hallucinations   .  Shellfish-Derived Products Swelling    "Eyes swell shut"    IV Location/Drains/Wounds Patient Lines/Drains/Airways Status   Active Line/Drains/Airways    Name:   Placement date:   Placement time:   Site:   Days:   Implanted Port 11/23/17 Right Chest   11/23/17    -    Chest   32   GI Stent 10 Fr.   09/21/17    1251    -   95   GI Stent 10 Fr.   09/23/17    1142    -   93   Incision (Closed) 10/03/17 Abdomen Other (Comment)   10/03/17    1150     83   Incision - 6 Ports Abdomen 1: Upper;Mid;Distal 2: Upper;Mid 3: Left;Upper 4: Left;Lower 5: Right;Upper 6: Right;Lower   10/03/17    1042     83          Labs/Imaging Results for orders placed or performed during the hospital encounter of 12/25/17 (from the past 48 hour(s))  CBC with Differential/Platelet     Status: Abnormal   Collection Time: 12/25/17  8:59 PM  Result Value Ref Range   WBC 19.5 (H) 4.0 - 10.5 K/uL   RBC 2.85 (L) 4.22 - 5.81 MIL/uL   Hemoglobin 8.0 (L) 13.0 - 17.0 g/dL  HCT 27.1 (L) 39.0 - 52.0 %   MCV 95.1 80.0 - 100.0 fL   MCH 28.1 26.0 - 34.0 pg   MCHC 29.5 (L) 30.0 - 36.0 g/dL   RDW 21.9 (H) 11.5 - 15.5 %   Platelets 46 (L) 150 - 400 K/uL    Comment: REPEATED TO VERIFY PLATELET COUNT CONFIRMED BY SMEAR SPECIMEN CHECKED FOR CLOTS Immature Platelet Fraction may be clinically indicated, consider ordering this additional test [id number]    nRBC 0.1 0.0 - 0.2 %   Neutrophils Relative % 80 %   Neutro Abs 15.6 (H) 1.7 - 7.7 K/uL   Lymphocytes Relative 12 %   Lymphs Abs 2.3 0.7 - 4.0 K/uL   Monocytes Relative 5 %   Monocytes Absolute 1.1 (H) 0.1 - 1.0 K/uL   Eosinophils Relative 2 %   Eosinophils Absolute 0.3 0.0 - 0.5 K/uL   Basophils Relative 0 %   Basophils Absolute 0.1 0.0 - 0.1 K/uL   Immature Granulocytes 1 %   Abs Immature Granulocytes 0.15 (H) 0.00 - 0.07 K/uL   Tear Drop Cells PRESENT     Comment: Performed at [facility], [place] [address], [place], [place] [address]   Comprehensive metabolic panel     Status: Abnormal   Collection Time: 12/25/17  8:59 PM  Result Value Ref Range   Sodium 140 135 - 145 mmol/L   Potassium 4.3 3.5 - 5.1 mmol/L   Chloride 112 (H) 98 - 111 mmol/L   CO2 20 (L) 22 - 32 mmol/L   Glucose, Bld 99 70 - 99 mg/dL   BUN 42 (H) 8 - 23 mg/dL   Creatinine, Ser 1.67 (H) 0.61 - 1.24 mg/dL   Calcium 8.4 (L) 8.9 - 10.3 mg/dL   Total Protein 4.9 (L) 6.5 - 8.1 g/dL   Albumin 1.6 (L) 3.5 - 5.0 g/dL   AST 52 (H) 15 - 41 U/L   ALT 22 0 - 44 U/L   Alkaline Phosphatase 361 (H) 38 - 126 U/L   Total Bilirubin 2.6 (H) 0.3 - 1.2 mg/dL   GFR calc non Af Amer 42 (L) >60 mL/min   GFR calc Af Amer 49 (L) >60 mL/min    Comment: (NOTE) The eGFR has been calculated using the CKD EPI equation. This calculation has not been validated in all clinical situations. eGFR's persistently <60 mL/min signify possible Chronic Kidney Disease.    Anion gap 8 5 - 15    Comment: Performed at [facility], [place] [address], [place], [place] [address]  Lipase, blood     Status: None   Collection Time: 12/25/17  8:59 PM  Result Value Ref Range   Lipase 19 11 - 51 U/L    Comment: Performed at [facility], [place] [address] [place], [place] [address]   No results found. None  Pending Labs Unresulted Labs (From admission, onward)    Start     Ordered   12/25/17 2028  Urinalysis, Routine w reflex microscopic  Once,   R     12/25/17 2027   12/25/17 2028  Urine Culture  STAT,   STAT     12/25/17 2027          Vitals/Pain Today's Vitals   12/25/17 2022 12/25/17 2024 12/25/17 2232  BP:  128/77 111/67  Pulse:  96 92  Resp:  12 17  Temp:  97.7 F (36.5 C)   TempSrc:  Oral   SpO2:  95%  97%  Weight: 63.5 kg    Height: 5' 7"  (1.702 m)    PainSc: 0-No pain  0-No pain    Isolation Precautions No active isolations  Medications Medications  0.9 %  sodium chloride infusion ( Intravenous New Bag/Given 12/25/17 2058)   sodium chloride 0.9 % bolus 1,000 mL (0 mLs Intravenous Stopped 12/25/17 2228)    Mobility walks with person assist

## 2017-12-25 NOTE — ED Notes (Signed)
Bed: NA12 Expected date:  Expected time:  Means of arrival:  Comments: 62 yr old cancer, lethargic

## 2017-12-25 NOTE — ED Provider Notes (Signed)
[facility] DEPT Provider Note   CSN: [id number] Arrival date & time: 12/25/17  2014     History   Chief Complaint Chief Complaint  Patient presents with  . Fatigue  . Emesis    HPI Jerry Weiss is a 62 y.o. male.  62 year old male who presents with weakness and dehydration times several days.  Patient states that he has had nonbilious emesis x1 today.  No fever chills.  Has a history of pancreatic cancer and is currently getting chemotherapy.  Patient endorses anorexia.  Does become more weak when he stands up.  Called EMS was transported here     Past Medical History:  Diagnosis Date  . Anemia    mild  . 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 Diabetes mellitus without complication ([place])    type2  . GERD (gastroesophageal reflux disease)   . Hypertension   . Inguinal hernia    Bilateral age 32  . Leg muscle spasm    both legs occ  . OSA (obstructive sleep apnea)    Wears CPAP at night  . Pneumonia    history of x3  . Renal cancer, right ([place])   . 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 Renal mass, right   . Status post dilation of esophageal narrowing   . Umbilical hernia    Small noted on CT 05/26/2017  . Ventral hernia    noted on CT 05/26/2017    Patient Active Problem List   Diagnosis Date Noted  . Port-A-Cath in place 11/28/2017  . Primary cancer of head of pancreas ([place]) 11/04/2017  . Goals of care, counseling/discussion 11/04/2017  . Acute calculous cholecystitis s/p lap cholecystectomy 10/03/2017 09/30/2017  . Acute cholangitis 09/30/2017  . Abdominal pain, generalized 09/25/2017  . Obstructive jaundice 09/20/2017  . Hypodense mass of liver 09/20/2017  . DM2 (diabetes mellitus, type 2) ([place]) 09/20/2017  . HTN (hypertension) 09/20/2017  . Urinary retention due to benign prostatic hyperplasia 09/08/2017  .  Renal cancer, right ([place]) 07/28/2017  . Aortic dissection ([place]) 03/06/2017  . OSA (obstructive sleep apnea) 07/15/2010    Past Surgical History:  Procedure Laterality Date  . BILIARY STENT PLACEMENT  09/21/2017   Procedure: BILIARY STENT PLACEMENT;  Surgeon: Clarene Essex, MD;  Location: [facility] ENDOSCOPY;  Service: Endoscopy;;  . BILIARY STENT PLACEMENT  09/23/2017   Procedure: BILIARY STENT PLACEMENT;  Surgeon: Clarene Essex, MD;  Location: [facility];  Service: Endoscopy;;  . BRONCHIAL BRUSHINGS  09/21/2017   Procedure: BILIARY BRUSHINGS;  Surgeon: Clarene Essex, MD;  Location: [facility];  Service: Endoscopy;;  . CHOLECYSTECTOMY N/A 10/03/2017   Procedure: LAPAROSCOPIC CHOLECYSTECTOMY;  Surgeon: Excell Seltzer, MD;  Location: [facility];  Service: General;  Laterality: N/A;  . COLONOSCOPY    . ERCP N/A 09/21/2017   Procedure: ENDOSCOPIC RETROGRADE CHOLANGIOPANCREATOGRAPHY (ERCP);  Surgeon: Clarene Essex, MD;  Location: [place];  Service: Endoscopy;  Laterality: N/A;  . ERCP N/A 09/23/2017   Procedure: ENDOSCOPIC RETROGRADE CHOLANGIOPANCREATOGRAPHY (ERCP);  Surgeon: Clarene Essex, MD;  Location: [facility];  Service: Endoscopy;  Laterality: N/A;  . HERNIA REPAIR Bilateral    AGE 76  . IR IMAGING GUIDED PORT INSERTION  11/23/2017  . PICC LINE INSERTION    . ROBOT ASSISTED LAPAROSCOPIC NEPHRECTOMY Right 07/28/2017   Procedure: XI ROBOTIC ASSISTED LAPAROSCOPIC PARTIAL NEPHRECTOMY;  Surgeon: Raynelle Bring, MD;  Location:  [facility];  Service: Urology;  Laterality: Right;  . SPHINCTEROTOMY  09/21/2017   Procedure: SPHINCTEROTOMY;  Surgeon: Clarene Essex, MD;  Location: [facility] ENDOSCOPY;  Service: Endoscopy;;  . Bess Kinds CHOLANGIOSCOPY N/A 09/23/2017   Procedure: OVZCHYIF CHOLANGIOSCOPY;  Surgeon: Clarene Essex, MD;  Location: [facility];  Service: Endoscopy;  Laterality: N/A;  . TONSILLECTOMY     age 51  . TRANSURETHRAL RESECTION OF PROSTATE    . TRANSURETHRAL RESECTION OF PROSTATE N/A 09/08/2017   Procedure:  TRANSURETHRAL RESECTION OF THE PROSTATE (TURP);  Surgeon: Irine Seal, MD;  Location: [facility];  Service: Urology;  Laterality: N/A;  . UPPER GASTROINTESTINAL ENDOSCOPY          Home Medications    Prior to Admission medications   Medication Sig Start Date End Date Taking? Authorizing Provider  amLODipine (NORVASC) 10 MG tablet Take 10 mg by mouth every evening.     [provider]  azithromycin (ZITHROMAX) 250 MG tablet  11/13/17   [provider]  clotrimazole-betamethasone (LOTRISONE) cream Apply 1 application topically 2 (two) times daily. 12/02/17   Owens Shark, NP  diphenhydrAMINE (BENADRYL) 25 mg capsule Take 1 capsule (25 mg total) by mouth every 6 (six) hours as needed for itching. 10/14/17   Nita Sells, MD  famotidine (PEPCID) 20 MG tablet Take 1 tablet (20 mg total) by mouth daily. 10/15/17   Nita Sells, MD  feeding supplement, ENSURE ENLIVE, (ENSURE ENLIVE) LIQD Take 237 mLs by mouth 3 (three) times daily with meals. 10/14/17   Nita Sells, MD  fluconazole (DIFLUCAN) 100 MG tablet Take 1 tablet (100 mg total) by mouth daily. 12/06/17   Owens Shark, NP  gabapentin (NEURONTIN) 300 MG capsule Take 300 mg by mouth daily as needed (neuropathy).     [provider]  hydrALAZINE (APRESOLINE) 100 MG tablet Take 100 mg by mouth 3 (three) times daily.    [provider]  HYDROmorphone (DILAUDID) 4 MG tablet Take 1 tablet (4 mg total) by mouth every 4 (four) hours as needed for severe pain. 12/21/17   Ladell Pier, MD  labetalol (NORMODYNE) 200 MG tablet Take 400 mg by mouth 3 (three) times daily.     [provider]  LORazepam (ATIVAN) 0.5 MG tablet Place 1 tablet (0.5 mg total) under the tongue every 6 (six) hours as needed for anxiety. 12/23/17   Ladell Pier, MD  magic mouthwash SOLN Take 5 mLs by mouth 4 (four) times daily as needed. 5 ml swish and spit as needed every 4 to 6 hrs as needed 11/28/17   Ladell Pier, MD  methocarbamol (ROBAXIN) 500 MG tablet Take 2 tablets (1,000 mg total) by mouth 4 (four) times daily. 10/14/17   Nita Sells, MD  metoCLOPramide (REGLAN) 10 MG tablet Take 1 tablet (10 mg total) by mouth 3 (three) times daily before meals. 10/28/17   Owens Shark, NP  morphine (MS CONTIN) 15 MG 12 hr tablet Take 1 tablet (15 mg total) by mouth every 12 (twelve) hours. 12/21/17   Ladell Pier, MD  neomycin-polymyxin-dexameth (MAXITROL) 0.1 % OINT Place 1 application into both eyes daily as needed (eye irritation).    [provider]  nystatin (MYCOSTATIN/NYSTOP) powder Apply topically 4 (four) times daily. 11/24/17   Ladell Pier, MD  ondansetron (ZOFRAN) 8 MG tablet Take 1 tablet (8 mg total) by mouth every 8 (eight) hours as needed for nausea or vomiting. 11/28/17   Owens Shark, NP  pantoprazole (PROTONIX) 20 MG tablet Take 20 mg by mouth daily.  07/11/16   [provider]  pioglitazone (ACTOS) 15 MG tablet Take 15 mg by mouth daily.    [provider]  polyethylene glycol (MIRALAX / GLYCOLAX) packet Take 17 g by mouth daily as needed for mild constipation or moderate constipation. 09/26/17   Samuella Cota, MD  promethazine (PHENERGAN) 12.5 MG tablet TAKE 1 OR 2 TABLETS BY MOUTH EVERY 6 HOURS AS NEEDED FOR NAUSEA AND VOMITING 11/06/17   [provider]  rivaroxaban (XARELTO) 20 MG TABS tablet Take 1 tablet (20 mg total) by mouth daily with supper. 11/28/17   Owens Shark, NP  sitaGLIPtin (JANUVIA) 100 MG tablet Take 100 mg by mouth daily.    [provider]    Family History Family History  Problem Relation Age of Onset  . Allergies Mother   . Diabetes Mother   . Stroke Mother   . Skin cancer Mother   . Allergies Sister   . Asthma Daughter        childhood  . Asthma Father        developed a lot of medical problems after a fall  . Colon cancer Neg Hx   . Stomach cancer Neg Hx   . Rectal cancer Neg Hx   . Liver cancer  Neg Hx   . Esophageal cancer Neg Hx     Social History Social History   Tobacco Use  . Smoking status: Never Smoker  . Smokeless tobacco: Never Used  Substance Use Topics  . Alcohol use: No  . Drug use: No     Allergies   Ciprofloxacin; Codeine; and Shellfish-derived products   Review of Systems Review of Systems  All other systems reviewed and are negative.    Physical Exam Updated Vital Signs BP 128/77 (BP Location: Left Arm)   Pulse 96   Temp 97.7 F (36.5 C) (Oral)   Resp 12   Ht 1.702 m (5' 7" )   Wt 63.5 kg   SpO2 95%   BMI 21.93 kg/m   Physical Exam  Constitutional: He is oriented to person, place, and time. He appears lethargic. He appears cachectic.  Non-toxic appearance. No distress.  HENT:  Head: Normocephalic and atraumatic.  Dry mucous membranes  Eyes: Pupils are equal, round, and reactive to light. Conjunctivae, EOM and lids are normal.  Neck: Normal range of motion. Neck supple. No tracheal deviation present. No thyroid mass present.  Cardiovascular: Normal rate, regular rhythm and normal heart sounds. Exam reveals no gallop.  No murmur heard. Pulmonary/Chest: Effort normal and breath sounds normal. No stridor. No respiratory distress. He has no decreased breath sounds. He has no wheezes. He has no rhonchi. He has no rales.  Abdominal: Soft. Normal appearance and bowel sounds are normal. He exhibits no distension. There is no tenderness. There is no rigidity, no rebound, no guarding and no CVA tenderness.  Musculoskeletal: Normal range of motion. He exhibits no edema or tenderness.  Neurological: He is oriented to person, place, and time. He has normal strength. He appears lethargic. No cranial nerve deficit or sensory deficit. GCS eye subscore is 4. GCS verbal subscore is 5. GCS motor subscore is 6.  Skin: Skin is warm and dry. No abrasion and no rash noted.  Diffuse jaundice noted  Psychiatric: He has a normal mood and affect. His speech is normal  and behavior is normal.  Nursing note and vitals reviewed.    ED  Treatments / Results  Labs (all labs ordered are listed, but only abnormal results are displayed) Labs Reviewed  URINE CULTURE  CBC WITH DIFFERENTIAL/PLATELET  COMPREHENSIVE METABOLIC PANEL  LIPASE, BLOOD  URINALYSIS, ROUTINE W REFLEX MICROSCOPIC    EKG None  Radiology No results found.  Procedures Procedures (including critical care time)  Medications Ordered in ED Medications  sodium chloride 0.9 % bolus 1,000 mL (has no administration in time range)  0.9 %  sodium chloride infusion (has no administration in time range)     Initial Impression / Assessment and Plan / ED Course  I have reviewed the triage vital signs and the nursing notes.  Pertinent labs & imaging results that were available during my care of the patient were reviewed by me and considered in my medical decision making (see chart for details).    Is given IV fluids here.  Has evidence of mild acute kidney injury.  Will require admission for dehydration Final Clinical Impressions(s) / ED Diagnoses   Final diagnoses:  None    ED Discharge Orders    None       Lacretia Leigh, MD 12/25/17 2223

## 2017-12-25 NOTE — ED Triage Notes (Addendum)
Patient is complaining of weakness and not being able to keep food down. Patient has cancer and last chemo treatment a week ago. Patient has had pain medication earlier and is able to keep pain medication down.

## 2017-12-25 NOTE — Progress Notes (Signed)
Received report on patient from ED RN

## 2017-12-26 ENCOUNTER — Observation Stay (HOSPITAL_COMMUNITY): Payer: BLUE CROSS/BLUE SHIELD

## 2017-12-26 DIAGNOSIS — G893 Neoplasm related pain (acute) (chronic): Secondary | ICD-10-CM

## 2017-12-26 DIAGNOSIS — N2889 Other specified disorders of kidney and ureter: Secondary | ICD-10-CM

## 2017-12-26 DIAGNOSIS — G4733 Obstructive sleep apnea (adult) (pediatric): Secondary | ICD-10-CM

## 2017-12-26 DIAGNOSIS — D649 Anemia, unspecified: Secondary | ICD-10-CM

## 2017-12-26 DIAGNOSIS — E860 Dehydration: Secondary | ICD-10-CM

## 2017-12-26 DIAGNOSIS — C250 Malignant neoplasm of head of pancreas: Secondary | ICD-10-CM | POA: Diagnosis not present

## 2017-12-26 DIAGNOSIS — I10 Essential (primary) hypertension: Secondary | ICD-10-CM | POA: Diagnosis not present

## 2017-12-26 DIAGNOSIS — C787 Secondary malignant neoplasm of liver and intrahepatic bile duct: Secondary | ICD-10-CM

## 2017-12-26 DIAGNOSIS — C7901 Secondary malignant neoplasm of right kidney and renal pelvis: Secondary | ICD-10-CM | POA: Diagnosis not present

## 2017-12-26 DIAGNOSIS — Z6821 Body mass index (BMI) 21.0-21.9, adult: Secondary | ICD-10-CM

## 2017-12-26 DIAGNOSIS — E119 Type 2 diabetes mellitus without complications: Secondary | ICD-10-CM

## 2017-12-26 DIAGNOSIS — N179 Acute kidney failure, unspecified: Secondary | ICD-10-CM | POA: Diagnosis not present

## 2017-12-26 DIAGNOSIS — K219 Gastro-esophageal reflux disease without esophagitis: Secondary | ICD-10-CM

## 2017-12-26 DIAGNOSIS — I82401 Acute embolism and thrombosis of unspecified deep veins of right lower extremity: Secondary | ICD-10-CM

## 2017-12-26 DIAGNOSIS — R112 Nausea with vomiting, unspecified: Secondary | ICD-10-CM | POA: Diagnosis not present

## 2017-12-26 DIAGNOSIS — R627 Adult failure to thrive: Secondary | ICD-10-CM

## 2017-12-26 LAB — CBC WITH DIFFERENTIAL/PLATELET
ABS IMMATURE GRANULOCYTES: 0.11 10*3/uL — AB (ref 0.00–0.07)
BASOS ABS: 0 10*3/uL (ref 0.0–0.1)
Basophils Relative: 0 %
Eosinophils Absolute: 0.3 10*3/uL (ref 0.0–0.5)
Eosinophils Relative: 2 %
HCT: 25 % — ABNORMAL LOW (ref 39.0–52.0)
HEMOGLOBIN: 7.4 g/dL — AB (ref 13.0–17.0)
IMMATURE GRANULOCYTES: 1 %
LYMPHS ABS: 2.4 10*3/uL (ref 0.7–4.0)
LYMPHS PCT: 16 %
MCH: 28.1 pg (ref 26.0–34.0)
MCHC: 29.6 g/dL — ABNORMAL LOW (ref 30.0–36.0)
MCV: 95.1 fL (ref 80.0–100.0)
MONOS PCT: 6 %
Monocytes Absolute: 1 10*3/uL (ref 0.1–1.0)
NEUTROS ABS: 11.6 10*3/uL — AB (ref 1.7–7.7)
NEUTROS PCT: 75 %
NRBC: 0 % (ref 0.0–0.2)
PLATELETS: 38 10*3/uL — AB (ref 150–400)
RBC: 2.63 MIL/uL — ABNORMAL LOW (ref 4.22–5.81)
RDW: 22.2 % — ABNORMAL HIGH (ref 11.5–15.5)
WBC: 15.4 10*3/uL — ABNORMAL HIGH (ref 4.0–10.5)

## 2017-12-26 LAB — URINALYSIS, ROUTINE W REFLEX MICROSCOPIC
Glucose, UA: NEGATIVE mg/dL
HGB URINE DIPSTICK: NEGATIVE
KETONES UR: NEGATIVE mg/dL
Leukocytes, UA: NEGATIVE
NITRITE: NEGATIVE
PROTEIN: NEGATIVE mg/dL
SPECIFIC GRAVITY, URINE: 1.02 (ref 1.005–1.030)
pH: 5 (ref 5.0–8.0)

## 2017-12-26 LAB — BASIC METABOLIC PANEL
Anion gap: 10 (ref 5–15)
BUN: 42 mg/dL — AB (ref 8–23)
CALCIUM: 8 mg/dL — AB (ref 8.9–10.3)
CO2: 17 mmol/L — ABNORMAL LOW (ref 22–32)
CREATININE: 1.72 mg/dL — AB (ref 0.61–1.24)
Chloride: 110 mmol/L (ref 98–111)
GFR calc Af Amer: 47 mL/min — ABNORMAL LOW (ref >60)
GFR, EST NON AFRICAN AMERICAN: 41 mL/min — AB (ref >60)
Glucose, Bld: 79 mg/dL (ref 70–99)
POTASSIUM: 4.1 mmol/L (ref 3.5–5.1)
SODIUM: 137 mmol/L (ref 135–145)

## 2017-12-26 LAB — GLUCOSE, CAPILLARY
GLUCOSE-CAPILLARY: 72 mg/dL (ref 70–99)
GLUCOSE-CAPILLARY: 76 mg/dL (ref 70–99)
GLUCOSE-CAPILLARY: 83 mg/dL (ref 70–99)
GLUCOSE-CAPILLARY: 90 mg/dL (ref 70–99)
Glucose-Capillary: 76 mg/dL (ref 70–99)
Glucose-Capillary: 80 mg/dL (ref 70–99)
Glucose-Capillary: 80 mg/dL (ref 70–99)

## 2017-12-26 LAB — HEPATIC FUNCTION PANEL
ALT: 22 U/L (ref 0–44)
AST: 50 U/L — AB (ref 15–41)
Albumin: 1.5 g/dL — ABNORMAL LOW (ref 3.5–5.0)
Alkaline Phosphatase: 313 U/L — ABNORMAL HIGH (ref 38–126)
BILIRUBIN DIRECT: 1.6 mg/dL — AB (ref 0.0–0.2)
BILIRUBIN INDIRECT: 1 mg/dL — AB (ref 0.3–0.9)
TOTAL PROTEIN: 4.5 g/dL — AB (ref 6.5–8.1)
Total Bilirubin: 2.6 mg/dL — ABNORMAL HIGH (ref 0.3–1.2)

## 2017-12-26 LAB — PATHOLOGIST SMEAR REVIEW

## 2017-12-26 MED ORDER — LORAZEPAM 0.5 MG PO TABS
0.5000 mg | ORAL_TABLET | Freq: Four times a day (QID) | ORAL | Status: DC | PRN
  Administered 2017-12-27: 0.5 mg via SUBLINGUAL
  Filled 2017-12-26: qty 1

## 2017-12-26 MED ORDER — SODIUM CHLORIDE 0.9% FLUSH
10.0000 mL | INTRAVENOUS | Status: DC | PRN
  Administered 2017-12-27 – 2017-12-28 (×3): 10 mL
  Filled 2017-12-26 (×3): qty 40

## 2017-12-26 MED ORDER — ENOXAPARIN SODIUM 80 MG/0.8ML ~~LOC~~ SOLN: 65.0000 mg | Freq: Two times a day (BID) | SUBCUTANEOUS | Status: DC

## 2017-12-26 MED ORDER — ACETAMINOPHEN 325 MG PO TABS: 650.0000 mg | ORAL_TABLET | Freq: Four times a day (QID) | ORAL | Status: DC | PRN

## 2017-12-26 MED ORDER — ONDANSETRON HCL 4 MG/2ML IJ SOLN
4.0000 mg | Freq: Four times a day (QID) | INTRAMUSCULAR | Status: DC | PRN
  Administered 2017-12-26 – 2017-12-28 (×7): 4 mg via INTRAVENOUS
  Filled 2017-12-26 (×7): qty 2

## 2017-12-26 MED ORDER — ENOXAPARIN SODIUM 80 MG/0.8ML ~~LOC~~ SOLN
65.0000 mg | Freq: Two times a day (BID) | SUBCUTANEOUS | Status: DC
  Administered 2017-12-26: 65 mg via SUBCUTANEOUS
  Filled 2017-12-26: qty 0.8

## 2017-12-26 MED ORDER — HYDROMORPHONE HCL 1 MG/ML IJ SOLN
1.0000 mg | INTRAMUSCULAR | Status: DC | PRN
  Administered 2017-12-26 (×2): 1 mg via INTRAVENOUS
  Filled 2017-12-26 (×3): qty 1

## 2017-12-26 MED ORDER — LABETALOL HCL 5 MG/ML IV SOLN
5.0000 mg | INTRAVENOUS | Status: DC | PRN
  Filled 2017-12-26: qty 4

## 2017-12-26 MED ORDER — ONDANSETRON HCL 4 MG PO TABS
4.0000 mg | ORAL_TABLET | Freq: Four times a day (QID) | ORAL | Status: DC | PRN
  Administered 2017-12-28: 4 mg via ORAL
  Filled 2017-12-26: qty 1

## 2017-12-26 MED ORDER — HYDROMORPHONE HCL 1 MG/ML IJ SOLN
1.0000 mg | INTRAMUSCULAR | Status: DC | PRN
  Administered 2017-12-26 – 2017-12-28 (×9): 2 mg via INTRAVENOUS
  Filled 2017-12-26: qty 1
  Filled 2017-12-26 (×2): qty 2
  Filled 2017-12-26: qty 1
  Filled 2017-12-26 (×2): qty 2
  Filled 2017-12-26 (×2): qty 1
  Filled 2017-12-26 (×3): qty 2

## 2017-12-26 MED ORDER — ACETAMINOPHEN 650 MG RE SUPP: 650.0000 mg | Freq: Four times a day (QID) | RECTAL | Status: DC | PRN

## 2017-12-26 MED ORDER — SODIUM CHLORIDE 0.9 % IV SOLN
INTRAVENOUS | Status: AC
  Administered 2017-12-26 (×2): via INTRAVENOUS

## 2017-12-26 MED ORDER — INSULIN ASPART 100 UNIT/ML ~~LOC~~ SOLN: 0.0000 [IU] | SUBCUTANEOUS | Status: DC

## 2017-12-26 NOTE — Evaluation (Signed)
SLP Cancellation Note  Patient Details Name: Jerry Weiss MRN: [id number] DOB: 1956-01-11   Cancelled treatment:       Reason Eval/Treat Not Completed: Other (comment);Medical issues which prohibited therapy(pt having problems "keeping po down" per notes, please clarify if need SLP for oropharyngeal dysphagia concerns, thanks. )   Macario Golds 12/26/2017, 10:44 AM   Luanna Salk, [place] SLP [place] Pager [phone] Office [phone]

## 2017-12-26 NOTE — Progress Notes (Addendum)
IP PROGRESS NOTE  Subjective:   Jerry Weiss known to me with a history of metastatic pancreas cancer.  He was last treated with FOLFOX chemotherapy 12/13/2017.  He presented to the emergency room yesterday with failure to thrive.  His sister-in-law and daughter have been caring for him in the home.  His daughter is currently out of town.  His sister-in-law reports Mr. Bua has been very weak for several days.  He is not eating or drinking.  He had nausea and vomiting at home.  He has been somnolent.  Mr. Flannagan reports abdominal pain.  Objective: Vital signs in last 24 hours: Blood pressure 96/81, pulse 95, temperature 99 F (37.2 C), temperature source Oral, resp. rate 18, height 5' 7"  (1.702 m), weight 140 lb (63.5 kg), SpO2 95 %.  Intake/Output from previous day: 10/13 0701 - 10/14 0700 In: 1000 [IV Piggyback:1000] Out: 300 [Urine:300]  Physical Exam:  HEENT: The mouth is dry, no thrush or ulcers Lungs: Clear anteriorly, no respiratory distress Cardiac: Regular rate and rhythm Abdomen: The liver is palpable in the upper abdomen with associated tenderness Extremities: Trace edema at the foot bilaterally Neurologic: Lethargic, arousable, oriented to place and date.  Follows commands.  Portacath/PICC-without erythema  Lab Results: Recent Labs    12/25/17 2059 12/26/17 0557  WBC 19.5* 15.4*  HGB 8.0* 7.4*  HCT 27.1* 25.0*  PLT 46* 38*    BMET Recent Labs    12/25/17 2059 12/26/17 0557  NA 140 137  K 4.3 4.1  CL 112* 110  CO2 20* 17*  GLUCOSE 99 79  BUN 42* 42*  CREATININE 1.67* 1.72*  CALCIUM 8.4* 8.0*    Lab Results  Component Value Date   CEA1 5.3 (H) 10/09/2017    Studies/Results: Dg Abd Acute W/chest  Result Date: 12/26/2017 CLINICAL DATA:  Nausea, vomiting, and distended abdomen with history of pancreatic cancer. EXAM: DG ABDOMEN ACUTE W/ 1V CHEST COMPARISON:  Chest 10/20/2017 FINDINGS: Power port type central venous catheter with tip over the  cavoatrial junction region. No pneumothorax. Shallow inspiration with elevation of the right hemidiaphragm. Heart size and pulmonary vascularity is normal. Linear atelectasis or consolidation in the lower right lung base. Similar to previous study. Left lung clear. Mediastinal contours appear intact. Calcification of the aorta. Degenerative changes in the spine. Gas and stool throughout the colon. No small or large bowel distention. No free intra-abdominal air. No abnormal air-fluid levels. Surgical clips in the right upper quadrant with wall stent present, likely biliary. Small amount of pneumobilia is suggested, consistent with postoperative change. No radiopaque stones. Degenerative changes in the spine and hips. Vascular calcifications. IMPRESSION: Shallow inspiration with atelectasis or consolidation in the lower right lung. Normal nonobstructive bowel gas pattern. Stool-filled colon. Postoperative changes in the right upper quadrant with postoperative pneumobilia. Electronically Signed   By: Lucienne Capers M.D.   On: 12/26/2017 01:44    Medications: I have reviewed the patient's current medications.  Assessment/Plan:  1.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 7/11/2019clinical and radiologic presentation most consistent with pancreas cancer  CT abdomen/pelvis 09/20/2017-new hypodense liver lesions, gallbladder distention with intra-and extrahepatic biliary dilatation  ERCP 09/21/2017 and 09/23/2017, distal common duct stricture, status post placement of ridging, duct stents, cytology from biliary brushing-nondiagnostic  Ultrasound-guided biopsy of a right liver lesion 09/22/2017, metastatic poorly differentiated carcinoma, MSI-stable, tumor mutation burden 5, KRAS G12R, KEIT amplification, PDGFRA amplification  CT angiogram chest, abdomen, and pelvis 09/30/2017-ill-defined soft tissue in the uncinate process,  stable type B aortic dissection, distended  gallbladder, multiple hepatic metastases, retroperitoneal and periportal adenopathy, infarcted/atrophic left kidney  PET scan 11/03/2017- numerous liver metastases, hypermetabolic mass at the pancreas uncinate and head, increased metabolic activity of the umbilicus, right rectus abdominis, right abdominal pannus  Cycle 1 FOLFOX 11/15/2017  Cycle 2 FOLFOX 11/29/2017 (5-FU dose reduced due to mucositis)  Cycle 3 FOLFOX 12/13/2017  2.Acute cholecystitis, status post a cholecystectomy 10/03/2017  Gallbladder pathology-acute and chronic cholecystitis with cholelithiasis; no evidence of malignancy  Pathology on cystic duct lymph node-metastatic carcinoma;CK7, PAX-8, CD10 and p63 positive;CK 20, GATA-3, CDX-2 and CD117 negative 3.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 Pain-likely secondary to metastatic carcinoma;MS Contin 15 mg every 12 hours beginning 10/20/2017, Dilaudid as needed 12.Right lower extremity DVT 10/28/2017-treated with Xarelto 13.Port-A-Cath placement 11/23/2017 14.Significant mucositis following cycle 1 FOLFOX. 5-FU dose reduced beginning with cycle 2. 15.  Admission 12/25/2017 with failure to thrive and dehydration  Jerry Weiss has metastatic pancreas cancer.  He has completed 3 cycles of systemic chemotherapy with the FOLFOX regimen.  He is now at day 14 following the most recent chemotherapy.  He is admitted with failure to thrive.  I suspect his symptoms are mostly related to the underlying malignancy versus toxicity from chemotherapy.  I discussed the prognosis with Mr. Standley and his sister-in-law.  We discussed CODE STATUS and hospice care.   He is arousable but lethargic this morning.  He was not able to make a decision regarding CODE STATUS or hospice care.  The elevated BUN/creatinine is likely related to dehydration with possible renal insult.  The thrombocytopenia is secondary to chemotherapy.  Recommendations: 1.  Hold long-acting narcotics 2.  Hold Xarelto 3.  Hydration 4.  Diet as tolerated 5.  Discuss hospice and CODE STATUS in a.m. on 12/27/2017      LOS: 0 days   Betsy Coder, MD   12/26/2017, 8:05 AM I had further discussions with Mr. Wieman and his sister-in-law.  We discussed the prognosis and treatment options.  He is not a candidate for further chemotherapy.  He agrees to a [facility] referral and a no CODE blue status.  I will make a [facility] hospice referral to consider transfer to [facility].

## 2017-12-26 NOTE — Progress Notes (Signed)
[facility]  Patient Status: Active (receiving services up to time of hospitalization)  [facility] is providing the following services: RN, PT and HHA  If patient discharges after hours, please call [phone].   Jerry Weiss 12/26/2017, 9:12 AM

## 2017-12-26 NOTE — Progress Notes (Signed)
Jerry Weiss is a 62 y.o. male with history of metastatic poorly differentiated carcinoma last chemotherapy on November 29, 2017, history of hypertension diabetes mellitus type 2 type B aortic dissection who has had recent nephrectomy for cancer and cholecystectomy for cholecystitis diagnosed with DVT on Xarelto was brought to the ER after patient has been having poor oral intake over the last 24 hours.  Prior to that patient was also having multiple episodes of nausea vomiting.   He was admitted for failure to thrive and nausea, vomiting and dehydration.  Plan : 1. Goals of care discussion with oncology.  2. SLP evaluation    Birder Robson [phone]

## 2017-12-26 NOTE — H&P (Signed)
History and Physical    Jerry Weiss BWG:[id number] DOB: 1956/02/19 DOA: 12/25/2017  PCP: [name], MD  Patient coming from: Home.  Chief Complaint: Poor oral intake.  HPI: Jerry Weiss is a 62 y.o. male with history of metastatic poorly differentiated carcinoma last chemotherapy on November 29, 2017, history of hypertension diabetes mellitus type 2 type B aortic dissection who has had recent nephrectomy for cancer and cholecystectomy for cholecystitis diagnosed with DVT on Xarelto was brought to the ER after patient has been having poor oral intake over the last 24 hours.  Prior to that patient was also having multiple episodes of nausea vomiting.  Nausea vomiting improved with sublingual Ativan.  Has been unable to keep in anything and had poor oral intake last 24 hours.  Has chronic abdominal pain which patient states is nothing different from previous.  Denies any diarrhea.  Denies any chest pain or shortness of breath.  Denies any productive cough.  ED Course: In the ER patient looks weak and dehydrated.  Mildly hypotensive initially.  Labs reveal acute renal failure.  Patient was given fluid and admitted for acute renal failure with dehydration.  Abdominal exam appears soft with no rebound tenderness bowel sounds present.  In addition patient's labs also show worsening level of Crohn's which has further worsened from his labs done on December 12, 2017 at that time platelet count was 95 now it is 46.  No obvious bleeding noted.  Patient is afebrile at this time.  Review of Systems: As per HPI, rest all negative.   Past Medical History:  Diagnosis Date  . Anemia    mild  . 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 Diabetes mellitus without complication ([place])    type2  . GERD (gastroesophageal reflux disease)   . Hypertension   . Inguinal hernia    Bilateral age 56  .  Leg muscle spasm    both legs occ  . OSA (obstructive sleep apnea)    Wears CPAP at night  . Pneumonia    history of x3  . Renal cancer, right ([place])   . Renal infarct ([place]) 03/06/2017   Left   . Renal mass, right   . Status post dilation of esophageal narrowing   . Umbilical hernia    Small noted on CT 05/26/2017  . Ventral hernia    noted on CT 05/26/2017    Past Surgical History:  Procedure Laterality Date  . BILIARY STENT PLACEMENT  09/21/2017   Procedure: BILIARY STENT PLACEMENT;  Surgeon: Clarene Essex, MD;  Location: [facility] ENDOSCOPY;  Service: Endoscopy;;  . BILIARY STENT PLACEMENT  09/23/2017   Procedure: BILIARY STENT PLACEMENT;  Surgeon: Clarene Essex, MD;  Location: [place];  Service: Endoscopy;;  . BRONCHIAL BRUSHINGS  09/21/2017   Procedure: BILIARY BRUSHINGS;  Surgeon: Clarene Essex, MD;  Location: [place];  Service: Endoscopy;;  . CHOLECYSTECTOMY N/A 10/03/2017   Procedure: LAPAROSCOPIC CHOLECYSTECTOMY;  Surgeon: Excell Seltzer, MD;  Location: [place];  Service: General;  Laterality: N/A;  . COLONOSCOPY    . ERCP N/A 09/21/2017   Procedure: ENDOSCOPIC RETROGRADE CHOLANGIOPANCREATOGRAPHY (ERCP);  Surgeon: Clarene Essex, MD;  Location: [place];  Service: Endoscopy;  Laterality: N/A;  . ERCP N/A 09/23/2017   Procedure: ENDOSCOPIC RETROGRADE CHOLANGIOPANCREATOGRAPHY (ERCP);  Surgeon: Clarene Essex, MD;  Location: [place];  Service: Endoscopy;  Laterality: N/A;  . HERNIA REPAIR Bilateral  AGE 61  . IR IMAGING GUIDED PORT INSERTION  11/23/2017  . PICC LINE INSERTION    . ROBOT ASSISTED LAPAROSCOPIC NEPHRECTOMY Right 07/28/2017   Procedure: XI ROBOTIC ASSISTED LAPAROSCOPIC PARTIAL NEPHRECTOMY;  Surgeon: Raynelle Bring, MD;  Location: [facility];  Service: Urology;  Laterality: Right;  . SPHINCTEROTOMY  09/21/2017   Procedure: SPHINCTEROTOMY;  Surgeon: Clarene Essex, MD;  Location: [facility] ENDOSCOPY;  Service: Endoscopy;;  . Bess Kinds CHOLANGIOSCOPY N/A 09/23/2017   Procedure:  MCEYEMVV CHOLANGIOSCOPY;  Surgeon: Clarene Essex, MD;  Location: [place];  Service: Endoscopy;  Laterality: N/A;  . TONSILLECTOMY     age 84  . TRANSURETHRAL RESECTION OF PROSTATE    . TRANSURETHRAL RESECTION OF PROSTATE N/A 09/08/2017   Procedure: TRANSURETHRAL RESECTION OF THE PROSTATE (TURP);  Surgeon: Irine Seal, MD;  Location: [facility];  Service: Urology;  Laterality: N/A;  . UPPER GASTROINTESTINAL ENDOSCOPY       reports that he has never smoked. He has never used smokeless tobacco. He reports that he does not drink alcohol or use drugs.  Allergies  Allergen Reactions  . Ciprofloxacin Other (See Comments)    Unknown  . Codeine Other (See Comments)    Hallucinations   . Shellfish-Derived Products Swelling    "Eyes swell shut"    Family History  Problem Relation Age of Onset  . Allergies Mother   . Diabetes Mother   . Stroke Mother   . Skin cancer Mother   . Allergies Sister   . Asthma Daughter        childhood  . Asthma Father        developed a lot of medical problems after a fall  . Colon cancer Neg Hx   . Stomach cancer Neg Hx   . Rectal cancer Neg Hx   . Liver cancer Neg Hx   . Esophageal cancer Neg Hx     Prior to Admission medications   Medication Sig Start Date End Date Taking? Authorizing Provider  diphenhydrAMINE (BENADRYL) 25 mg capsule Take 1 capsule (25 mg total) by mouth every 6 (six) hours as needed for itching. 10/14/17  Yes Nita Sells, MD  feeding supplement, ENSURE ENLIVE, (ENSURE ENLIVE) LIQD Take 237 mLs by mouth 3 (three) times daily with meals. Patient taking differently: Take 237 mLs by mouth daily.  10/14/17  Yes Nita Sells, MD  gabapentin (NEURONTIN) 300 MG capsule Take 300 mg by mouth daily as needed (neuropathy).    Yes [provider]  hydrALAZINE (APRESOLINE) 100 MG tablet Take 100 mg by mouth 2 (two) times daily.    Yes [provider]  HYDROmorphone (DILAUDID) 4 MG tablet Take 1 tablet (4 mg total) by  mouth every 4 (four) hours as needed for severe pain. 12/21/17  Yes Ladell Pier, MD  labetalol (NORMODYNE) 200 MG tablet Take 400 mg by mouth 2 (two) times daily.    Yes [provider]  LORazepam (ATIVAN) 0.5 MG tablet Place 1 tablet (0.5 mg total) under the tongue every 6 (six) hours as needed for anxiety. 12/23/17  Yes Ladell Pier, MD  magic mouthwash SOLN Take 5 mLs by mouth 4 (four) times daily as needed. 5 ml swish and spit as needed every 4 to 6 hrs as needed Patient taking differently: Take 5 mLs by mouth 4 (four) times daily as needed for mouth pain. 5 ml swish and spit as needed every 4 to 6 hrs as needed 11/28/17  Yes Ladell Pier, MD  methocarbamol (ROBAXIN)  500 MG tablet Take 2 tablets (1,000 mg total) by mouth 4 (four) times daily. Patient taking differently: Take 1,000 mg by mouth 4 (four) times daily as needed for muscle spasms.  10/14/17  Yes Nita Sells, MD  morphine (MS CONTIN) 15 MG 12 hr tablet Take 1 tablet (15 mg total) by mouth every 12 (twelve) hours. 12/21/17  Yes Ladell Pier, MD  neomycin-polymyxin-dexameth (MAXITROL) 0.1 % OINT Place 1 application into both eyes daily as needed (eye irritation).   Yes [provider]  ondansetron (ZOFRAN) 8 MG tablet Take 1 tablet (8 mg total) by mouth every 8 (eight) hours as needed for nausea or vomiting. 11/28/17  Yes Owens Shark, NP  pantoprazole (PROTONIX) 20 MG tablet Take 20 mg by mouth at bedtime.  07/11/16  Yes [provider]  pioglitazone (ACTOS) 15 MG tablet Take 15 mg by mouth daily.   Yes [provider]  polyethylene glycol (MIRALAX / GLYCOLAX) packet Take 17 g by mouth daily as needed for mild constipation or moderate constipation. 09/26/17  Yes Samuella Cota, MD  promethazine (PHENERGAN) 12.5 MG tablet Take 12.5 mg by mouth every 6 (six) hours as needed for nausea or vomiting.  11/06/17  Yes [provider]  rivaroxaban (XARELTO) 20 MG TABS tablet Take 1  tablet (20 mg total) by mouth daily with supper. 11/28/17  Yes Owens Shark, NP  sitaGLIPtin (JANUVIA) 100 MG tablet Take 100 mg by mouth daily.   Yes [provider]  clotrimazole-betamethasone (LOTRISONE) cream Apply 1 application topically 2 (two) times daily. Patient not taking: Reported on 12/25/2017 12/02/17   Owens Shark, NP  famotidine (PEPCID) 20 MG tablet Take 1 tablet (20 mg total) by mouth daily. Patient not taking: Reported on 12/25/2017 10/15/17   Nita Sells, MD  fluconazole (DIFLUCAN) 100 MG tablet Take 1 tablet (100 mg total) by mouth daily. Patient not taking: Reported on 12/25/2017 12/06/17   Owens Shark, NP  metoCLOPramide (REGLAN) 10 MG tablet Take 1 tablet (10 mg total) by mouth 3 (three) times daily before meals. Patient not taking: Reported on 12/25/2017 10/28/17   Owens Shark, NP  nystatin (MYCOSTATIN/NYSTOP) powder Apply topically 4 (four) times daily. Patient not taking: Reported on 12/25/2017 11/24/17   Ladell Pier, MD    Physical Exam: Vitals:   12/25/17 2022 12/25/17 2024 12/25/17 2232 12/25/17 2340  BP:  128/77 111/67 127/75  Pulse:  96 92 89  Resp:  12 17 (!) 9  Temp:  97.7 F (36.5 C)  98.3 F (36.8 C)  TempSrc:  Oral    SpO2:  95% 97% 96%  Weight: 63.5 kg     Height: 5' 7"  (1.702 m)         Constitutional: Moderately built and nourished. Vitals:   12/25/17 2022 12/25/17 2024 12/25/17 2232 12/25/17 2340  BP:  128/77 111/67 127/75  Pulse:  96 92 89  Resp:  12 17 (!) 9  Temp:  97.7 F (36.5 C)  98.3 F (36.8 C)  TempSrc:  Oral    SpO2:  95% 97% 96%  Weight: 63.5 kg     Height: 5' 7"  (1.702 m)      Eyes: Anicteric no pallor. ENMT: No discharge from the ears eyes nose or mouth. Neck: No mass felt.  No neck rigidity. Respiratory: No rhonchi or crepitations. Cardiovascular: S1-S2 heard no murmurs appreciated. Abdomen: Soft no rebound tenderness no guarding or rigidity. Musculoskeletal: No edema. Skin: No  rash. Neurologic: Mildly lethargic but answers questions oriented to place and person and time.  Moves all extremities. Psychiatric: Mildly lethargic.   Labs on Admission: I have personally reviewed following labs and imaging studies  CBC: Recent Labs  Lab 12/25/17 2059  WBC 19.5*  NEUTROABS 15.6*  HGB 8.0*  HCT 27.1*  MCV 95.1  PLT 46*   Basic Metabolic Panel: Recent Labs  Lab 12/25/17 2059  NA 140  K 4.3  CL 112*  CO2 20*  GLUCOSE 99  BUN 42*  CREATININE 1.67*  CALCIUM 8.4*   GFR: Estimated Creatinine Clearance: 41.2 mL/min (A) (by C-G formula based on SCr of 1.67 mg/dL (H)). Liver Function Tests: Recent Labs  Lab 12/25/17 2059  AST 52*  ALT 22  ALKPHOS 361*  BILITOT 2.6*  PROT 4.9*  ALBUMIN 1.6*   Recent Labs  Lab 12/25/17 2059  LIPASE 19   No results for input(s): AMMONIA in the last 168 hours. Coagulation Profile: No results for input(s): INR, PROTIME in the last 168 hours. Cardiac Enzymes: No results for input(s): CKTOTAL, CKMB, CKMBINDEX, TROPONINI in the last 168 hours. BNP (last 3 results) No results for input(s): PROBNP in the last 8760 hours. HbA1C: No results for input(s): HGBA1C in the last 72 hours. CBG: No results for input(s): GLUCAP in the last 168 hours. Lipid Profile: No results for input(s): CHOL, HDL, LDLCALC, TRIG, CHOLHDL, LDLDIRECT in the last 72 hours. Thyroid Function Tests: No results for input(s): TSH, T4TOTAL, FREET4, T3FREE, THYROIDAB in the last 72 hours. Anemia Panel: No results for input(s): VITAMINB12, FOLATE, FERRITIN, TIBC, IRON, RETICCTPCT in the last 72 hours. Urine analysis:    Component Value Date/Time   COLORURINE YELLOW 09/30/2017 2106   APPEARANCEUR CLEAR 09/30/2017 2106   LABSPEC 1.016 09/30/2017 2106   PHURINE 5.0 09/30/2017 2106   GLUCOSEU NEGATIVE 09/30/2017 2106   HGBUR SMALL (A) 09/30/2017 2106   [place] NEGATIVE 09/30/2017 2106   [place] NEGATIVE 09/30/2017 2106   PROTEINUR 30 (A)  09/30/2017 2106   NITRITE NEGATIVE 09/30/2017 2106   LEUKOCYTESUR SMALL (A) 09/30/2017 2106   Sepsis Labs: @LABRCNTIP (procalcitonin:4,lacticidven:4) )No results found for this or any previous visit (from the past 240 hour(s)).   Radiological Exams on Admission: No results found.    Assessment/Plan Principal Problem:   Acute renal failure (ARF) (HCC) Active Problems:   OSA (obstructive sleep apnea)   DM2 (diabetes mellitus, type 2) (HCC)   HTN (hypertension)   Aortic dissection (HCC)   Nausea & vomiting    1. Acute renal failure likely from prerenal cause likely from nausea vomiting and poor oral intake -we will gently hydrate UA is pending.  Closely follow intake output.  Since patient has nausea vomiting and also chronic abdominal pain will get acute abdominal series for now.  If patient's condition does not improve may have to have further imaging. 2. Severe thrombocytopenia -patient is afebrile.  Will check smear review and get opinion from oncologist.  Follow CBC closely.  No obvious bleeding seen at this time. 3. History of DVT on Xarelto.  Since patient cannot reliably take orally discussed with pharmacy and place patient on Lovenox.  Note that patient also has severe thrombocytopenia. 4. Diabetes mellitus type 2 we will keep patient on sliding scale coverage for now. 5. Hypertension on PRN IV labetalol for now until patient can take reliably orally. 6. Metastatic adenocarcinoma will get Dr. Learta Codding, patient's oncologist opinion. 7. History of renal carcinoma status post nephrectomy. 8. History of type B  dissection.  See #1. 9. Chronic pain secondary to metastasis disease.  Will keep patient on IV Dilaudid for now patient can swallow.  Discussed with patient's son Mccrae Speciale over the phone.  Patient's son is a Special educational needs teacher of attorney.  Can be reached through [phone].  He lives in [place].  At this time patient's son is requested full code.   DVT prophylaxis:  Lovenox. Code Status: Code. Family Communication: Patient's son. Disposition Plan: To be determined. Consults called: We will consult oncology.  Swallow team. Admission status: Observation.   Rise Patience MD [facility] Pager [phone].  If 7PM-7AM, please contact night-coverage [email or web address] Password [email or web address]  12/26/2017, 12:19 AM

## 2017-12-26 NOTE — Progress Notes (Signed)
ANTICOAGULATION CONSULT NOTE - Initial Consult  Pharmacy Consult for enoxaparin Indication: DVT  Allergies  Allergen Reactions  . Ciprofloxacin Other (See Comments)    Unknown  . Codeine Other (See Comments)    Hallucinations   . Shellfish-Derived Products Swelling    "Eyes swell shut"    Patient Measurements: Height: 5' 7"  (170.2 cm) Weight: 140 lb (63.5 kg) IBW/kg (Calculated) : 66.1 Heparin Dosing Weight:   Vital Signs: Temp: 99 F (37.2 C) (10/14 0451) Temp Source: Oral (10/14 0451) BP: 96/81 (10/14 0451) Pulse Rate: 95 (10/14 0451)  Labs: Recent Labs    12/25/17 2059  HGB 8.0*  HCT 27.1*  PLT 46*  CREATININE 1.67*    Estimated Creatinine Clearance: 41.2 mL/min (A) (by C-G formula based on SCr of 1.67 mg/dL (H)).   Medical History: Past Medical History:  Diagnosis Date  . Anemia    mild  . 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 Diabetes mellitus without complication ([place])    type2  . GERD (gastroesophageal reflux disease)   . Hypertension   . Inguinal hernia    Bilateral age 2  . Leg muscle spasm    both legs occ  . OSA (obstructive sleep apnea)    Wears CPAP at night  . Pneumonia    history of x3  . Renal cancer, right ([place])   . Renal infarct ([place]) 03/06/2017   Left   . Renal mass, right   . Status post dilation of esophageal narrowing   . Umbilical hernia    Small noted on CT 05/26/2017  . Ventral hernia    noted on CT 05/26/2017    Medications:  Infusions:  . sodium chloride      Assessment: Patient with rivaroxaban but MD wishes to change to enoxaparin.  PLT low at < 50k, MD aware.    Goal of Therapy:  Anti-Xa level 0.6-1 units/ml 4hrs after LMWH dose given Monitor platelets by anticoagulation protocol: Yes   Plan:  Enoxaparin 61m sq q12hr  GNani SkillernCrowford 12/26/2017,6:02 AM

## 2017-12-27 DIAGNOSIS — K5090 Crohn's disease, unspecified, without complications: Secondary | ICD-10-CM | POA: Diagnosis present

## 2017-12-27 DIAGNOSIS — R627 Adult failure to thrive: Secondary | ICD-10-CM | POA: Diagnosis present

## 2017-12-27 DIAGNOSIS — I7100 Dissection of unspecified site of aorta: Secondary | ICD-10-CM | POA: Diagnosis not present

## 2017-12-27 DIAGNOSIS — C7901 Secondary malignant neoplasm of right kidney and renal pelvis: Secondary | ICD-10-CM | POA: Diagnosis not present

## 2017-12-27 DIAGNOSIS — G893 Neoplasm related pain (acute) (chronic): Secondary | ICD-10-CM | POA: Diagnosis present

## 2017-12-27 DIAGNOSIS — C250 Malignant neoplasm of head of pancreas: Secondary | ICD-10-CM | POA: Diagnosis not present

## 2017-12-27 DIAGNOSIS — R338 Other retention of urine: Secondary | ICD-10-CM | POA: Diagnosis present

## 2017-12-27 DIAGNOSIS — N179 Acute kidney failure, unspecified: Secondary | ICD-10-CM | POA: Diagnosis not present

## 2017-12-27 DIAGNOSIS — K219 Gastro-esophageal reflux disease without esophagitis: Secondary | ICD-10-CM | POA: Diagnosis present

## 2017-12-27 DIAGNOSIS — Z743 Need for continuous supervision: Secondary | ICD-10-CM | POA: Diagnosis not present

## 2017-12-27 DIAGNOSIS — Z9221 Personal history of antineoplastic chemotherapy: Secondary | ICD-10-CM | POA: Diagnosis not present

## 2017-12-27 DIAGNOSIS — C787 Secondary malignant neoplasm of liver and intrahepatic bile duct: Secondary | ICD-10-CM | POA: Diagnosis not present

## 2017-12-27 DIAGNOSIS — D6959 Other secondary thrombocytopenia: Secondary | ICD-10-CM | POA: Diagnosis present

## 2017-12-27 DIAGNOSIS — T451X5A Adverse effect of antineoplastic and immunosuppressive drugs, initial encounter: Secondary | ICD-10-CM | POA: Diagnosis present

## 2017-12-27 DIAGNOSIS — R279 Unspecified lack of coordination: Secondary | ICD-10-CM | POA: Diagnosis not present

## 2017-12-27 DIAGNOSIS — I10 Essential (primary) hypertension: Secondary | ICD-10-CM | POA: Diagnosis not present

## 2017-12-27 DIAGNOSIS — E119 Type 2 diabetes mellitus without complications: Secondary | ICD-10-CM | POA: Diagnosis not present

## 2017-12-27 DIAGNOSIS — Z905 Acquired absence of kidney: Secondary | ICD-10-CM | POA: Diagnosis not present

## 2017-12-27 DIAGNOSIS — Z833 Family history of diabetes mellitus: Secondary | ICD-10-CM | POA: Diagnosis not present

## 2017-12-27 DIAGNOSIS — Z86718 Personal history of other venous thrombosis and embolism: Secondary | ICD-10-CM | POA: Diagnosis not present

## 2017-12-27 DIAGNOSIS — N401 Enlarged prostate with lower urinary tract symptoms: Secondary | ICD-10-CM | POA: Diagnosis present

## 2017-12-27 DIAGNOSIS — Z808 Family history of malignant neoplasm of other organs or systems: Secondary | ICD-10-CM | POA: Diagnosis not present

## 2017-12-27 DIAGNOSIS — R52 Pain, unspecified: Secondary | ICD-10-CM | POA: Diagnosis not present

## 2017-12-27 DIAGNOSIS — G4733 Obstructive sleep apnea (adult) (pediatric): Secondary | ICD-10-CM | POA: Diagnosis present

## 2017-12-27 DIAGNOSIS — E860 Dehydration: Secondary | ICD-10-CM | POA: Diagnosis present

## 2017-12-27 DIAGNOSIS — Z7984 Long term (current) use of oral hypoglycemic drugs: Secondary | ICD-10-CM | POA: Diagnosis not present

## 2017-12-27 DIAGNOSIS — Z7901 Long term (current) use of anticoagulants: Secondary | ICD-10-CM | POA: Diagnosis not present

## 2017-12-27 DIAGNOSIS — C259 Malignant neoplasm of pancreas, unspecified: Secondary | ICD-10-CM | POA: Diagnosis present

## 2017-12-27 DIAGNOSIS — R112 Nausea with vomiting, unspecified: Secondary | ICD-10-CM | POA: Diagnosis not present

## 2017-12-27 DIAGNOSIS — Z79899 Other long term (current) drug therapy: Secondary | ICD-10-CM | POA: Diagnosis not present

## 2017-12-27 DIAGNOSIS — Z85528 Personal history of other malignant neoplasm of kidney: Secondary | ICD-10-CM | POA: Diagnosis not present

## 2017-12-27 LAB — URINE CULTURE

## 2017-12-27 LAB — GLUCOSE, CAPILLARY
GLUCOSE-CAPILLARY: 78 mg/dL (ref 70–99)
GLUCOSE-CAPILLARY: 87 mg/dL (ref 70–99)
GLUCOSE-CAPILLARY: 97 mg/dL (ref 70–99)
Glucose-Capillary: 100 mg/dL — ABNORMAL HIGH (ref 70–99)
Glucose-Capillary: 88 mg/dL (ref 70–99)
Glucose-Capillary: 96 mg/dL (ref 70–99)

## 2017-12-27 MED ORDER — PROMETHAZINE HCL 25 MG/ML IJ SOLN
12.5000 mg | Freq: Once | INTRAMUSCULAR | Status: AC
  Administered 2017-12-27: 12.5 mg via INTRAVENOUS
  Filled 2017-12-27: qty 1

## 2017-12-27 MED ORDER — MORPHINE SULFATE (CONCENTRATE) 10 MG/0.5ML PO SOLN
10.0000 mg | ORAL | Status: DC | PRN
  Administered 2017-12-27: 10 mg via SUBLINGUAL
  Administered 2017-12-28: 20 mg via SUBLINGUAL
  Filled 2017-12-27: qty 1
  Filled 2017-12-27: qty 0.5

## 2017-12-27 MED ORDER — MORPHINE SULFATE (CONCENTRATE) 10 MG/0.5ML PO SOLN: 10.0000 mg | ORAL | Status: DC | PRN

## 2017-12-27 NOTE — Progress Notes (Signed)
IP PROGRESS NOTE  Subjective:   Jerry Weiss complains of constant abdominal pain.  Family members are at the bedside.  He had an episode of nausea and vomiting this morning.  He has difficulty going to the bathroom or using a urinal.  Objective: Vital signs in last 24 hours: Blood pressure 136/86, pulse 99, temperature 98.1 F (36.7 C), temperature source Oral, resp. rate 15, height 5' 7"  (1.702 m), weight 140 lb (63.5 kg), SpO2 95 %.  Intake/Output from previous day: 10/14 0701 - 10/15 0700 In: 2177.2 [I.V.:2177.2] Out: 400 [Urine:400]  Physical Exam:  HEENT: The mouth is dry, no thrush or ulcers, ecchymosis of the right buccal mucosa  Abdomen: The liver is palpable in the upper abdomen with associated tenderness Extremities: Trace edema at the foot bilaterally   Portacath/PICC-without erythema  Lab Results: Recent Labs    12/25/17 2059 12/26/17 0557  WBC 19.5* 15.4*  HGB 8.0* 7.4*  HCT 27.1* 25.0*  PLT 46* 38*    BMET Recent Labs    12/25/17 2059 12/26/17 0557  NA 140 137  K 4.3 4.1  CL 112* 110  CO2 20* 17*  GLUCOSE 99 79  BUN 42* 42*  CREATININE 1.67* 1.72*  CALCIUM 8.4* 8.0*    Lab Results  Component Value Date   CEA1 5.3 (H) 10/09/2017    Studies/Results: Dg Abd Acute W/chest  Result Date: 12/26/2017 CLINICAL DATA:  Nausea, vomiting, and distended abdomen with history of pancreatic cancer. EXAM: DG ABDOMEN ACUTE W/ 1V CHEST COMPARISON:  Chest 10/20/2017 FINDINGS: Power port type central venous catheter with tip over the cavoatrial junction region. No pneumothorax. Shallow inspiration with elevation of the right hemidiaphragm. Heart size and pulmonary vascularity is normal. Linear atelectasis or consolidation in the lower right lung base. Similar to previous study. Left lung clear. Mediastinal contours appear intact. Calcification of the aorta. Degenerative changes in the spine. Gas and stool throughout the colon. No small or large bowel distention.  No free intra-abdominal air. No abnormal air-fluid levels. Surgical clips in the right upper quadrant with wall stent present, likely biliary. Small amount of pneumobilia is suggested, consistent with postoperative change. No radiopaque stones. Degenerative changes in the spine and hips. Vascular calcifications. IMPRESSION: Shallow inspiration with atelectasis or consolidation in the lower right lung. Normal nonobstructive bowel gas pattern. Stool-filled colon. Postoperative changes in the right upper quadrant with postoperative pneumobilia. Electronically Signed   By: Lucienne Capers M.D.   On: 12/26/2017 01:44    Medications: I have reviewed the patient's current medications.  Assessment/Plan:  1.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 7/11/2019clinical and radiologic presentation most consistent with pancreas cancer  CT abdomen/pelvis 09/20/2017-new hypodense liver lesions, gallbladder distention with intra-and extrahepatic biliary dilatation  ERCP 09/21/2017 and 09/23/2017, distal common duct stricture, status post placement of ridging, duct stents, cytology from biliary brushing-nondiagnostic  Ultrasound-guided biopsy of a right liver lesion 09/22/2017, metastatic poorly differentiated carcinoma, MSI-stable, tumor mutation burden 5, KRAS G12R, KEIT amplification, PDGFRA amplification  CT angiogram chest, abdomen, and pelvis 09/30/2017-ill-defined soft tissue in the uncinate process, stable type B aortic dissection, distended gallbladder, multiple hepatic metastases, retroperitoneal and periportal adenopathy, infarcted/atrophic left kidney  PET scan 11/03/2017- numerous liver metastases, hypermetabolic mass at the pancreas uncinate and head, increased metabolic activity of the umbilicus, right rectus abdominis, right abdominal pannus  Cycle 1 FOLFOX 11/15/2017  Cycle 2 FOLFOX 11/29/2017 (5-FU dose reduced due to mucositis)  Cycle 3 FOLFOX  12/13/2017  2.Acute cholecystitis, status post  a cholecystectomy 10/03/2017  Gallbladder pathology-acute and chronic cholecystitis with cholelithiasis; no evidence of malignancy  Pathology on cystic duct lymph node-metastatic carcinoma;CK7, PAX-8, CD10 and p63 positive;CK 20, GATA-3, CDX-2 and CD117 negative 3.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 Pain-likely secondary to metastatic carcinoma;MS Contin 15 mg every 12 hours beginning 10/20/2017, Dilaudid as needed 12.Right lower extremity DVT 10/28/2017-treated with Xarelto 13.Port-A-Cath placement 11/23/2017 14.Significant mucositis following cycle 1 FOLFOX. 5-FU dose reduced beginning with cycle 2. 15.  Admission 12/25/2017 with failure to thrive and dehydration  Mr. Sevin has metastatic pancreas cancer.  His performance status is poor.  He agrees to Hospice care.  He and his family are in agreement with transfer to [facility].  Recommendations: 1.  Begin trial of Roxanol, will add a long-acting narcotic if this is not helpful 2.  Condom catheter 3.  [place] hospice referral to consider transfer to [facility]      LOS: 0 days   Betsy Coder, MD

## 2017-12-27 NOTE — Progress Notes (Signed)
[facility]  Received request from [place] for patient interest in [facility]. Chart reviewed and met with patient's HCPOA Kristine to acknowledge referral and answer questions. She is aware [facility] does not have a room to offer today and that I will follow up again tomorrow morning. CSW [place] updated.   Thank you,  Erling Conte, LCSW [phone]

## 2017-12-27 NOTE — Discharge Summary (Addendum)
Physician Discharge Summary  Jerry Weiss FXJ:[id number] DOB: 05/19/1955 DOA: 12/25/2017  PCP: Jerry Cruel, MD  Admit date: 12/25/2017 Discharge date: 12/28/2017  Admitted From: Home.  Disposition:  [facility]/ residential hospice.   Recommendations for Outpatient Follow-up:  Follow up with hospice MD as recommended.   Discharge Condition:Hospice.  CODE STATUS:comfort care.  Diet recommendation: comfort feeds.   Brief/Interim Summary: Jerry Weiss a 62 y.o.malewithhistory of metastatic poorly differentiated carcinoma last chemotherapy on November 29, 2017, history of hypertension diabetes mellitus type 2 type B aortic dissection who has had recent nephrectomy for cancer and cholecystectomy for cholecystitis diagnosed with DVT on Xarelto was brought to the ER after patient has been having poor oral intake over the last 24 hours. Prior to that patient was also having multiple episodes of nausea vomiting.   He was admitted for failure to thrive and nausea, vomiting and dehydration. His prognosis is less than 2 weeks.   Discharge Diagnoses:  Principal Problem:   Acute renal failure (ARF) (HCC) Active Problems:   OSA (obstructive sleep apnea)   DM2 (diabetes mellitus, type 2) (HCC)   HTN (hypertension)   Aortic dissection (HCC)   Nausea & vomiting  AKI:  Probably from dehydration , nausea, vomiting/ renal insult.     Hypertension;  Well controlled.    Dr [name] discussed with the patient and family and transition to comfort care with residential hospice placement.   Discharge Instructions   Allergies as of 12/27/2017      Reactions   Ciprofloxacin Other (See Comments)   Unknown   Codeine Other (See Comments)   Hallucinations    Shellfish-derived Products Swelling   "Eyes swell shut"      Medication List    STOP taking these medications   clotrimazole-betamethasone cream Commonly known as:  LOTRISONE   diphenhydrAMINE 25 mg capsule Commonly  known as:  BENADRYL   famotidine 20 MG tablet Commonly known as:  PEPCID   feeding supplement (ENSURE ENLIVE) Liqd   fluconazole 100 MG tablet Commonly known as:  DIFLUCAN   gabapentin 300 MG capsule Commonly known as:  NEURONTIN   hydrALAZINE 100 MG tablet Commonly known as:  APRESOLINE   HYDROmorphone 4 MG tablet Commonly known as:  DILAUDID   labetalol 200 MG tablet Commonly known as:  NORMODYNE   methocarbamol 500 MG tablet Commonly known as:  ROBAXIN   metoCLOPramide 10 MG tablet Commonly known as:  REGLAN   morphine 15 MG 12 hr tablet Commonly known as:  MS CONTIN Replaced by:  morphine CONCENTRATE 10 MG/0.5ML Soln concentrated solution   neomycin-polymyxin-dexameth 0.1 % Oint Commonly known as:  MAXITROL   nystatin powder Commonly known as:  MYCOSTATIN/NYSTOP   ondansetron 8 MG tablet Commonly known as:  ZOFRAN   pantoprazole 20 MG tablet Commonly known as:  PROTONIX   pioglitazone 15 MG tablet Commonly known as:  ACTOS   polyethylene glycol packet Commonly known as:  MIRALAX / GLYCOLAX   promethazine 12.5 MG tablet Commonly known as:  PHENERGAN   rivaroxaban 20 MG Tabs tablet Commonly known as:  XARELTO   sitaGLIPtin 100 MG tablet Commonly known as:  JANUVIA     TAKE these medications   LORazepam 0.5 MG tablet Commonly known as:  ATIVAN Place 1 tablet (0.5 mg total) under the tongue every 6 (six) hours as needed for anxiety.   magic mouthwash Soln Take 5 mLs by mouth 4 (four) times daily as needed. 5 ml swish and spit as needed every  4 to 6 hrs as needed What changed:  reasons to take this   morphine CONCENTRATE 10 MG/0.5ML Soln concentrated solution Place 0.5-1 mLs (10-20 mg total) under the tongue every 2 (two) hours as needed for moderate pain or severe pain. Replaces:  morphine 15 MG 12 hr tablet       Allergies  Allergen Reactions  . Ciprofloxacin Other (See Comments)    Unknown  . Codeine Other (See Comments)     Hallucinations   . Shellfish-Derived Products Swelling    "Eyes swell shut"    Consultations:  Oncology Dr [name]   Procedures/Studies: Dg Abd Acute W/chest  Result Date: 12/26/2017 CLINICAL DATA:  Nausea, vomiting, and distended abdomen with history of pancreatic cancer. EXAM: DG ABDOMEN ACUTE W/ 1V CHEST COMPARISON:  Chest 10/20/2017 FINDINGS: Power port type central venous catheter with tip over the cavoatrial junction region. No pneumothorax. Shallow inspiration with elevation of the right hemidiaphragm. Heart size and pulmonary vascularity is normal. Linear atelectasis or consolidation in the lower right lung base. Similar to previous study. Left lung clear. Mediastinal contours appear intact. Calcification of the aorta. Degenerative changes in the spine. Gas and stool throughout the colon. No small or large bowel distention. No free intra-abdominal air. No abnormal air-fluid levels. Surgical clips in the right upper quadrant with wall stent present, likely biliary. Small amount of pneumobilia is suggested, consistent with postoperative change. No radiopaque stones. Degenerative changes in the spine and hips. Vascular calcifications. IMPRESSION: Shallow inspiration with atelectasis or consolidation in the lower right lung. Normal nonobstructive bowel gas pattern. Stool-filled colon. Postoperative changes in the right upper quadrant with postoperative pneumobilia. Electronically Signed   By: Lucienne Capers M.D.   On: 12/26/2017 01:44       Subjective: Calm   Discharge Exam: Vitals:   12/26/17 2000 12/27/17 0403  BP: 134/82 136/86  Pulse: 98 99  Resp: 16 15  Temp: 98.8 F (37.1 C) 98.1 F (36.7 C)  SpO2: 95% 95%   Vitals:   12/26/17 0451 12/26/17 1341 12/26/17 2000 12/27/17 0403  BP: 96/81 123/74 134/82 136/86  Pulse: 95 93 98 99  Resp: 18 14 16 15   Temp: 99 F (37.2 C) 98.5 F (36.9 C) 98.8 F (37.1 C) 98.1 F (36.7 C)  TempSrc: Oral Oral Oral Oral  SpO2: 95% 100%  95% 95%  Weight:      Height:        General: pt lethargic,  Cardiovascular: RRR, S1/S2 +,  Respiratory: diminished at bases.  Abdominal: Soft,, bowel sounds + Extremities: no edema, no cyanosis    The results of significant diagnostics from this hospitalization (including imaging, microbiology, ancillary and laboratory) are listed below for reference.     Microbiology: Recent Results (from the past 240 hour(s))  Urine Culture     Status: Abnormal   Collection Time: 12/26/17  5:32 AM  Result Value Ref Range Status   Specimen Description   Final    URINE, CLEAN CATCH Performed at [facility], [place] [address] [place] [address]    Special Requests   Final    NONE Performed at [facility], [place] [address], [place], [place] [address]    Culture MULTIPLE SPECIES PRESENT, SUGGEST RECOLLECTION (A)  Final   Report Status 12/27/2017 FINAL  Final     Labs: BNP (last 3 results) No results for input(s): BNP in the last 8760 hours. Basic Metabolic Panel: Recent Labs  Lab 12/25/17 2059 12/26/17 0557  NA 140 137  K 4.3 4.1  CL 112* 110  CO2 20* 17*  GLUCOSE 99 79  BUN 42* 42*  CREATININE 1.67* 1.72*  CALCIUM 8.4* 8.0*   Liver Function Tests: Recent Labs  Lab 12/25/17 2059 12/26/17 0557  AST 52* 50*  ALT 22 22  ALKPHOS 361* 313*  BILITOT 2.6* 2.6*  PROT 4.9* 4.5*  ALBUMIN 1.6* 1.5*   Recent Labs  Lab 12/25/17 2059  LIPASE 19   No results for input(s): AMMONIA in the last 168 hours. CBC: Recent Labs  Lab 12/25/17 2059 12/26/17 0557  WBC 19.5* 15.4*  NEUTROABS 15.6* 11.6*  HGB 8.0* 7.4*  HCT 27.1* 25.0*  MCV 95.1 95.1  PLT 46* 38*   Cardiac Enzymes: No results for input(s): CKTOTAL, CKMB, CKMBINDEX, TROPONINI in the last 168 hours. BNP: Invalid input(s): POCBNP CBG: Recent Labs  Lab 12/26/17 1656 12/26/17 2001 12/26/17 2355 12/27/17 0404 12/27/17 0753  GLUCAP 80 80 76 78 88   D-Dimer No results  for input(s): DDIMER in the last 72 hours. Hgb A1c No results for input(s): HGBA1C in the last 72 hours. Lipid Profile No results for input(s): CHOL, HDL, LDLCALC, TRIG, CHOLHDL, LDLDIRECT in the last 72 hours. Thyroid function studies No results for input(s): TSH, T4TOTAL, T3FREE, THYROIDAB in the last 72 hours.  Invalid input(s): FREET3 Anemia work up No results for input(s): VITAMINB12, FOLATE, FERRITIN, TIBC, IRON, RETICCTPCT in the last 72 hours. Urinalysis    Component Value Date/Time   COLORURINE AMBER (A) 12/26/2017 0532   APPEARANCEUR CLEAR 12/26/2017 0532   LABSPEC 1.020 12/26/2017 0532   PHURINE 5.0 12/26/2017 0532   GLUCOSEU NEGATIVE 12/26/2017 0532   HGBUR NEGATIVE 12/26/2017 0532   BILIRUBINUR SMALL (A) 12/26/2017 0532   KETONESUR NEGATIVE 12/26/2017 0532   PROTEINUR NEGATIVE 12/26/2017 0532   NITRITE NEGATIVE 12/26/2017 0532   LEUKOCYTESUR NEGATIVE 12/26/2017 0532   Sepsis Labs Invalid input(s): PROCALCITONIN,  WBC,  LACTICIDVEN Microbiology Recent Results (from the past 240 hour(s))  Urine Culture     Status: Abnormal   Collection Time: 12/26/17  5:32 AM  Result Value Ref Range Status   Specimen Description   Final    URINE, CLEAN CATCH Performed at [facility], [place] [address], [place], [place] [address]    Special Requests   Final    NONE Performed at [facility], [place] [address] [place], [place] [address]    Culture MULTIPLE SPECIES PRESENT, SUGGEST RECOLLECTION (A)  Final   Report Status 12/27/2017 FINAL  Final     Time coordinating discharge: 32 minutes  SIGNED:   Hosie Poisson, MD  [facility] 12/27/2017, 11:42 AM Pager   If 7PM-7AM, please contact night-coverage [email or web address] Password [email or web address]

## 2017-12-27 NOTE — Progress Notes (Signed)
Pt now comfort care, will sign off.  Thanks.  Luanna Salk, [facility] SLP Acute Rehab Services Pager [phone] Office [phone]

## 2017-12-27 NOTE — Progress Notes (Signed)
CSW left voicemail for [place] to determine if patient is able to go to facility- awaiting return call.   Kingsley Spittle, [place]  [phone]

## 2017-12-28 ENCOUNTER — Other Ambulatory Visit: Payer: BLUE CROSS/BLUE SHIELD

## 2017-12-28 ENCOUNTER — Ambulatory Visit: Payer: BLUE CROSS/BLUE SHIELD

## 2017-12-28 ENCOUNTER — Ambulatory Visit: Payer: BLUE CROSS/BLUE SHIELD | Admitting: Nurse Practitioner

## 2017-12-28 DIAGNOSIS — R627 Adult failure to thrive: Secondary | ICD-10-CM

## 2017-12-28 LAB — GLUCOSE, CAPILLARY
Glucose-Capillary: 100 mg/dL — ABNORMAL HIGH (ref 70–99)
Glucose-Capillary: 98 mg/dL (ref 70–99)

## 2017-12-28 MED ORDER — MORPHINE SULFATE (CONCENTRATE) 10 MG/0.5ML PO SOLN
20.0000 mg | ORAL | Status: DC
  Administered 2017-12-28: 20 mg via SUBLINGUAL
  Filled 2017-12-28: qty 1

## 2017-12-28 MED ORDER — HEPARIN SOD (PORK) LOCK FLUSH 100 UNIT/ML IV SOLN
500.0000 [IU] | INTRAVENOUS | Status: AC | PRN
  Administered 2017-12-28: 500 [IU]

## 2017-12-28 MED ORDER — MORPHINE SULFATE (CONCENTRATE) 10 MG/0.5ML PO SOLN: 20.0000 mg | ORAL | 0 refills | Status: AC

## 2017-12-28 MED ORDER — LORAZEPAM 0.5 MG PO TABS
0.5000 mg | ORAL_TABLET | Freq: Four times a day (QID) | ORAL | Status: DC | PRN
  Administered 2017-12-28: 0.5 mg via SUBLINGUAL
  Filled 2017-12-28: qty 1

## 2017-12-28 NOTE — Progress Notes (Signed)
[facility] called for transport.  Report called to nurse, Colletta Maryland at [facility].  Patient resting at this time.    Virginia Rochester, RN

## 2017-12-28 NOTE — Progress Notes (Signed)
[facility] room available for Jerry Weiss today. Brother in [facility] completed paper work for transfer today.   Please send discharge summary to [phone].  RN please call report to [phone].  Thank you, Erling Conte, LCSW [phone]

## 2017-12-28 NOTE — Progress Notes (Signed)
Patient is set to discharge to [facility] today.Family at bedside are aware. Discharge packet given to RN. [facility] called for transport.   Discharge summary faxed to number provided.   Kingsley Spittle, [place] Clinical Social Worker [phone]

## 2017-12-28 NOTE — Progress Notes (Signed)
IP PROGRESS NOTE  Subjective:   Mr. Clinard continues to have abdominal pain and nausea.  The pain is partially relieved with the current narcotic regimen.  Ativan helped the nausea. Objective: Vital signs in last 24 hours: Blood pressure 123/84, pulse 96, temperature 98.2 F (36.8 C), resp. rate 14, height 5' 7"  (1.702 m), weight 140 lb (63.5 kg), SpO2 95 %.  Intake/Output from previous day: 10/15 0701 - 10/16 0700 In: 480 [P.O.:480] Out: -   Physical Exam:  HEENT: The mouth is dry, no thrush or ulcers  Abdomen: The liver is palpable in the upper abdomen with associated tenderness, distended with ascites Extremities: Trace edema at the foot bilaterally   Portacath/PICC-without erythema  Lab Results: Recent Labs    12/25/17 2059 12/26/17 0557  WBC 19.5* 15.4*  HGB 8.0* 7.4*  HCT 27.1* 25.0*  PLT 46* 38*    BMET Recent Labs    12/25/17 2059 12/26/17 0557  NA 140 137  K 4.3 4.1  CL 112* 110  CO2 20* 17*  GLUCOSE 99 79  BUN 42* 42*  CREATININE 1.67* 1.72*  CALCIUM 8.4* 8.0*    Lab Results  Component Value Date   CEA1 5.3 (H) 10/09/2017    Studies/Results: No results found.  Medications: I have reviewed the patient's current medications.  Assessment/Plan:  1.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 7/11/2019clinical and radiologic presentation most consistent with pancreas cancer  CT abdomen/pelvis 09/20/2017-new hypodense liver lesions, gallbladder distention with intra-and extrahepatic biliary dilatation  ERCP 09/21/2017 and 09/23/2017, distal common duct stricture, status post placement of ridging, duct stents, cytology from biliary brushing-nondiagnostic  Ultrasound-guided biopsy of a right liver lesion 09/22/2017, metastatic poorly differentiated carcinoma, MSI-stable, tumor mutation burden 5, KRAS G12R, KEIT amplification, PDGFRA amplification  CT angiogram chest, abdomen, and pelvis 09/30/2017-ill-defined  soft tissue in the uncinate process, stable type B aortic dissection, distended gallbladder, multiple hepatic metastases, retroperitoneal and periportal adenopathy, infarcted/atrophic left kidney  PET scan 11/03/2017- numerous liver metastases, hypermetabolic mass at the pancreas uncinate and head, increased metabolic activity of the umbilicus, right rectus abdominis, right abdominal pannus  Cycle 1 FOLFOX 11/15/2017  Cycle 2 FOLFOX 11/29/2017 (5-FU dose reduced due to mucositis)  Cycle 3 FOLFOX 12/13/2017  2.Acute cholecystitis, status post a cholecystectomy 10/03/2017  Gallbladder pathology-acute and chronic cholecystitis with cholelithiasis; no evidence of malignancy  Pathology on cystic duct lymph node-metastatic carcinoma;CK7, PAX-8, CD10 and p63 positive;CK 20, GATA-3, CDX-2 and CD117 negative 3.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 Pain-secondary to metastatic carcinoma;begin running Roxanol, add methadone as needed  12.Right lower extremity DVT 10/28/2017-treated with Xarelto on hold 13.Port-A-Cath placement 11/23/2017 14.Significant mucositis following cycle 1 FOLFOX. 5-FU dose reduced beginning with cycle 2. 15.  Admission 12/25/2017 with failure to thrive and dehydration  Jerry Weiss he needs to have pain and nausea.  He met with the Hospice team and is scheduled to go to [facility] today.  I will add scheduled Roxanol.  Methadone can be added as needed.  I will add Ativan as an antiemetic.  I will check on Mr. Diel at [facility] 01/03/2018.  Recommendations: 1.  Scheduled Roxanol 2.  Ativan as needed for nausea 3.  Transfer to [facility] today.      LOS:  1 day   Betsy Coder, MD

## 2017-12-28 NOTE — Progress Notes (Signed)
PROGRESS NOTE    Jerry Weiss  JZP:[id number] DOB: [birth year] DOA: 12/25/2017 PCP: [name], MD   Brief Narrative:  Jerry Weiss is a 62 y.o. male with history of metastatic poorly differentiated carcinoma last chemotherapy on November 29, 2017, history of hypertension diabetes mellitus type 2 type B aortic dissection who has had recent nephrectomy for cancer and cholecystectomy for cholecystitis diagnosed with DVT on Xarelto was brought to the ER after patient has been having poor oral intake over the last 24 hours.  Prior to that patient was also having multiple episodes of nausea vomiting.  Nausea vomiting improved with sublingual Ativan.  Has been unable to keep in anything and had poor oral intake last 24 hours.  Has chronic abdominal pain which patient stated was nothing different from previous.  Denied any diarrhea.  Denied any chest pain or shortness of breath.  Denied any productive cough.  ED Course: In the ER patient looks weak and dehydrated.  Mildly hypotensive initially.  Labs reveal acute renal failure.  Patient was given fluid and admitted for acute renal failure with dehydration.  Abdominal exam appearred soft with no rebound tenderness bowel sounds present.  In addition patient's labs also showed worsening level of platelets which has further worsened from his labs done on December 12, 2017 at that time platelet count was 95 now it is 46.  No obvious bleeding noted.  Patient is afebrile at this time.  Patient was admitted for failure to thrive with nausea vomiting and dehydration.  Patient seen in consultation by his oncologist Dr. [name] and decision was made for full comfort measures.  Patient subsequently placed on pain regimen and patient to be transferred to [facility] on 12/28/2017.   Assessment & Plan:   Principal Problem:   Acute renal failure (ARF) (HCC) Active Problems:   OSA (obstructive sleep apnea)   DM2 (diabetes mellitus, type 2) (HCC)   HTN  (hypertension)   Aortic dissection (HCC)   Nausea & vomiting   #1 acute renal failure/acute kidney injury Secondary to prerenal azotemia from poor oral intake due to dehydration, nausea and vomiting.  Patient placed on gentle hydration.  Patient was seen in consultation by oncology and decision was made to transition to full comfort measures.  Patient will be discharged to residential hospice home at [facility].  2.  Hypertension Remained stable.  3.  Severe thrombocytopenia Patient noted to have a severe thrombocytopenia with platelet count dropping to 46 on day of admission from 95 on 12/12/2017.  Patient had no signs of overt bleeding.  Patient seen in consultation by oncology and decision was made to transition to comfort measures.  Patient will be discharged to residential hospice home at [facility].  4.  Metastatic pancreatic cancer Patient noted on admission to have a history of metastatic pancreatic cancer and is undergone treatment with chemotherapy.  Patient noted to have a poor performance status with worsening of his disease.  Patient was seen in consultation by his oncologist and recommendations were made for transition to full comfort measures.  Patient was placed on Roxanol for better pain control.  Patient will be transferred to residential hospice home,[facility] on 12/28/2017.  5.  Failure to thrive Secondary to problem #4.  Patient now full comfort measures.  Patient to be transferred to residential hospice home.  6.  Diabetes mellitus type 2 Continue sliding scale insulin.  7.  History of renal cell carcinoma status post nephrectomy  8.  History of type  B aortic dissection Stable.  No changes chronic abdominal pain.  Blood pressure stable.  Patient now full comfort measures.  Patient to be transferred to residential hospice home.  9.  Chronic pain secondary to metastatic disease Patient on IV Dilaudid as needed.  Patient placed on scheduled Roxanol per oncology  today for better pain management.  Patient now full comfort measures.  Patient will be transferred to a residential hospice home at [facility].   DVT prophylaxis: Patient on full comfort measures. Code Status: DNR Family Communication: Updated patient and son at bedside. Disposition Plan: Okay for discharge to [facility] today.   Consultants:   Oncology: Dr. [name] 12/26/2017    Procedures:   Acute abdominal series 12/26/2017  Antimicrobials:  None   Subjective: Laying in bed.  States still having abdominal pain with some nausea and some partial improvement with pain with current pain regimen.  No shortness of breath.  Son at bedside stating they are supposed to go to [facility] today.  Objective: Vitals:   12/26/17 1341 12/26/17 2000 12/27/17 0403 12/28/17 0441  BP: 123/74 134/82 136/86 123/84  Pulse: 93 98 99 96  Resp: 14 16 15 14   Temp: 98.5 F (36.9 C) 98.8 F (37.1 C) 98.1 F (36.7 C) 98.2 F (36.8 C)  TempSrc: Oral Oral Oral   SpO2: 100% 95% 95% 95%  Weight:      Height:        Intake/Output Summary (Last 24 hours) at 12/28/2017 1053 Last data filed at 12/28/2017 0740 Gross per 24 hour  Intake 480 ml  Output -  Net 480 ml   Filed Weights   12/25/17 2022  Weight: 63.5 kg    Examination:  General exam: Appears calm and comfortable.  Dry mucous membranes. Respiratory system: Clear to auscultation. Respiratory effort normal. Cardiovascular system: S1 & S2 heard, RRR. No JVD, murmurs, rubs, gallops or clicks. No pedal edema. Gastrointestinal system: Abdomen is nondistended, tender to palpation in the epigastrium and upper abdomen, positive bowel sounds.  No rebound.  No guarding.   Central nervous system: Alert and oriented. No focal neurological deficits. Extremities: Symmetric 5 x 5 power. Skin: No rashes, lesions or ulcers Psychiatry: Judgement and insight appear normal. Mood & affect appropriate.     Data Reviewed: I have personally  reviewed following labs and imaging studies  CBC: Recent Labs  Lab 12/25/17 2059 12/26/17 0557  WBC 19.5* 15.4*  NEUTROABS 15.6* 11.6*  HGB 8.0* 7.4*  HCT 27.1* 25.0*  MCV 95.1 95.1  PLT 46* 38*   Basic Metabolic Panel: Recent Labs  Lab 12/25/17 2059 12/26/17 0557  NA 140 137  K 4.3 4.1  CL 112* 110  CO2 20* 17*  GLUCOSE 99 79  BUN 42* 42*  CREATININE 1.67* 1.72*  CALCIUM 8.4* 8.0*   GFR: Estimated Creatinine Clearance: 40 mL/min (A) (by C-G formula based on SCr of 1.72 mg/dL (H)). Liver Function Tests: Recent Labs  Lab 12/25/17 2059 12/26/17 0557  AST 52* 50*  ALT 22 22  ALKPHOS 361* 313*  BILITOT 2.6* 2.6*  PROT 4.9* 4.5*  ALBUMIN 1.6* 1.5*   Recent Labs  Lab 12/25/17 2059  LIPASE 19   No results for input(s): AMMONIA in the last 168 hours. Coagulation Profile: No results for input(s): INR, PROTIME in the last 168 hours. Cardiac Enzymes: No results for input(s): CKTOTAL, CKMB, CKMBINDEX, TROPONINI in the last 168 hours. BNP (last 3 results) No results for input(s): PROBNP in the last 8760  hours. HbA1C: No results for input(s): HGBA1C in the last 72 hours. CBG: Recent Labs  Lab 12/27/17 1605 12/27/17 2021 12/27/17 2351 12/28/17 0438 12/28/17 0732  GLUCAP 97 100* 96 100* 98   Lipid Profile: No results for input(s): CHOL, HDL, LDLCALC, TRIG, CHOLHDL, LDLDIRECT in the last 72 hours. Thyroid Function Tests: No results for input(s): TSH, T4TOTAL, FREET4, T3FREE, THYROIDAB in the last 72 hours. Anemia Panel: No results for input(s): VITAMINB12, FOLATE, FERRITIN, TIBC, IRON, RETICCTPCT in the last 72 hours. Sepsis Labs: No results for input(s): PROCALCITON, LATICACIDVEN in the last 168 hours.  Recent Results (from the past 240 hour(s))  Urine Culture     Status: Abnormal   Collection Time: 12/26/17  5:32 AM  Result Value Ref Range Status   Specimen Description   Final    URINE, CLEAN CATCH Performed at [facility], [place]  [address] [place], [place] [address]    Special Requests   Final    NONE Performed at [facility], [place] [address] [place], [place] [address]    Culture MULTIPLE SPECIES PRESENT, SUGGEST RECOLLECTION (A)  Final   Report Status 12/27/2017 FINAL  Final         Radiology Studies: No results found.      Scheduled Meds: . insulin aspart  0-9 Units Subcutaneous Q4H  . morphine CONCENTRATE  20 mg Sublingual Q4H while awake   Continuous Infusions:   LOS: 1 day    Time spent: 35 mins    Irine Seal, MD [facility] Pager [phone]  If 7PM-7AM, please contact night-coverage [email or web address] Password [email or web address] 12/28/2017, 10:53 AM

## 2017-12-28 NOTE — Progress Notes (Signed)
CSW spoke with Jerry Weiss and patient has bed at [facility] for today 10/16. Harmon Pier will complete needed patient work with Lance Morin, shortly and then patient can be transferred to facility. CSW will notify RN and MD.   Kingsley Spittle, [place]  [phone]

## 2017-12-30 ENCOUNTER — Telehealth: Payer: Self-pay | Admitting: Oncology

## 2017-12-30 NOTE — Telephone Encounter (Signed)
Appt scheduled LMVM for patient date/time per 10/18 sch msg

## 2018-01-02 DIAGNOSIS — C250 Malignant neoplasm of head of pancreas: Secondary | ICD-10-CM | POA: Diagnosis not present

## 2018-01-03 ENCOUNTER — Inpatient Hospital Stay: Payer: BLUE CROSS/BLUE SHIELD

## 2018-01-03 ENCOUNTER — Inpatient Hospital Stay: Payer: BLUE CROSS/BLUE SHIELD | Admitting: Oncology

## 2018-01-10 ENCOUNTER — Other Ambulatory Visit: Payer: BLUE CROSS/BLUE SHIELD

## 2018-01-10 ENCOUNTER — Ambulatory Visit: Payer: BLUE CROSS/BLUE SHIELD

## 2018-01-10 ENCOUNTER — Ambulatory Visit: Payer: BLUE CROSS/BLUE SHIELD | Admitting: Oncology

## 2018-01-13 DEATH — deceased

## 2018-01-15 ENCOUNTER — Other Ambulatory Visit: Payer: Self-pay | Admitting: Oncology

## 2018-01-17 ENCOUNTER — Inpatient Hospital Stay: Payer: BLUE CROSS/BLUE SHIELD | Admitting: Nurse Practitioner

## 2018-01-17 ENCOUNTER — Inpatient Hospital Stay: Payer: BLUE CROSS/BLUE SHIELD

## 2019-07-17 ENCOUNTER — Encounter: Payer: Self-pay | Admitting: Gastroenterology

## 2019-07-17 IMAGING — CT CT ABD-PELV W/O CM
2 of 4 series · 16 of 46 positions shown, 18 images · non-contrast
Comparison: CT chest, abdomen, and pelvis dated May 25, 2017.

CLINICAL DATA: Fatigue with nausea and vomiting. Recent TURP 2
weeks ago. History of renal cancer status post right partial
nephrectomy.

EXAM:
CT ABDOMEN AND PELVIS WITHOUT CONTRAST
TECHNIQUE: Multidetector CT imaging of the abdomen and pelvis was performed
following the standard protocol without IV contrast.

[Series 3: a/p w/o 5mm · axial · non-contrast · 0.79mm/px · z∈[+740,+1140]mm · 13 of 88 slices shown, 15 images]
[im 4/88  soft-tissue]
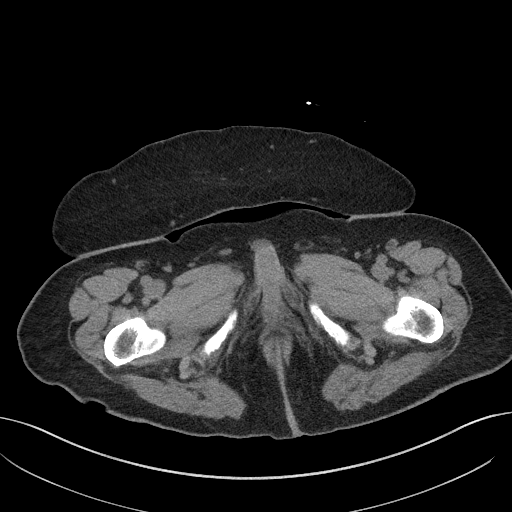
[im 4/88  bone]
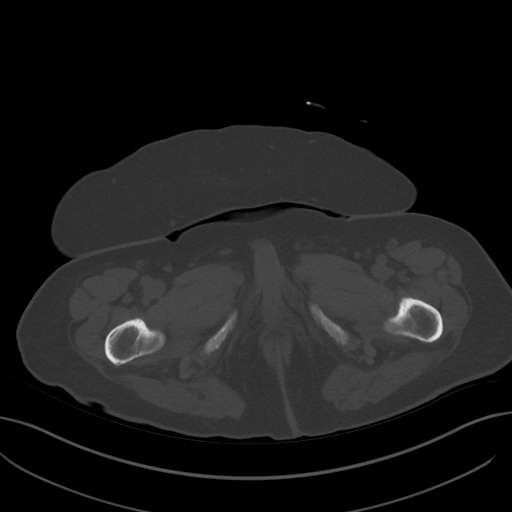
[im 11/88  soft-tissue]
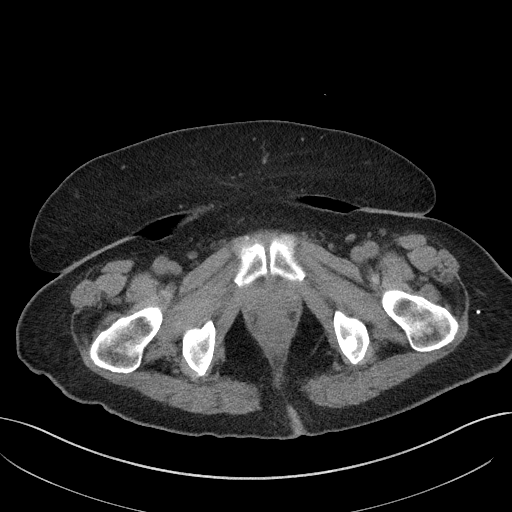
[im 19/88  soft-tissue]
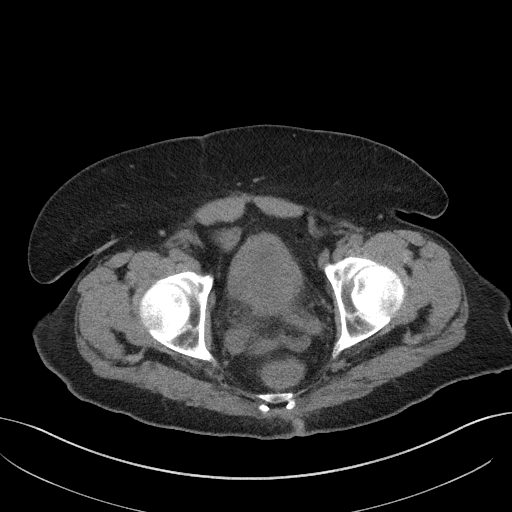
[im 26/88  soft-tissue]
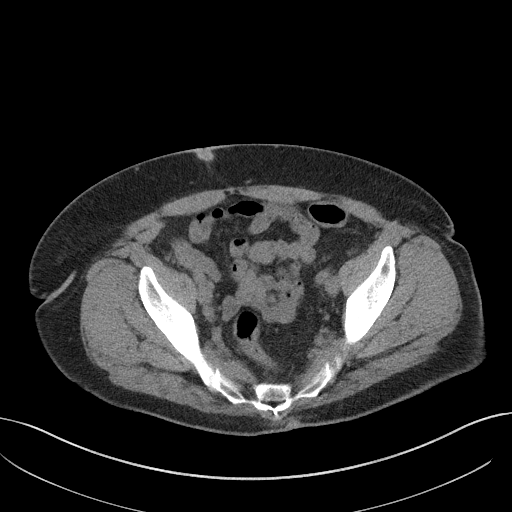
[im 30/88  soft-tissue]
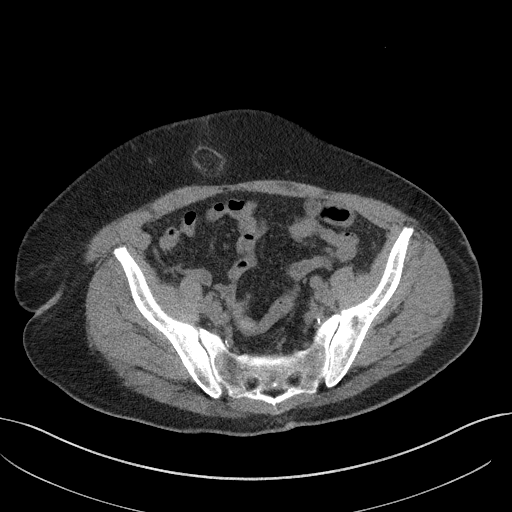
[im 37/88  soft-tissue]
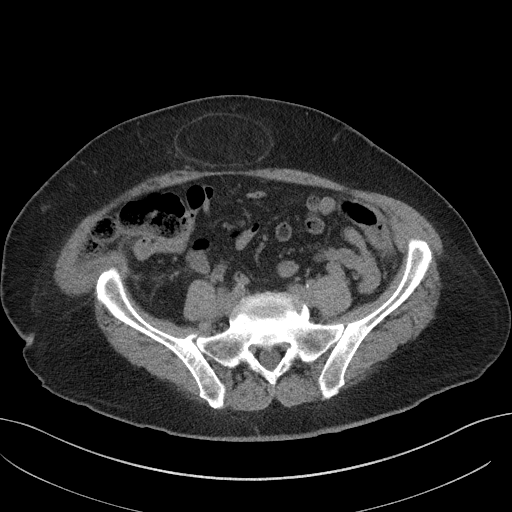
[im 44/88  soft-tissue]
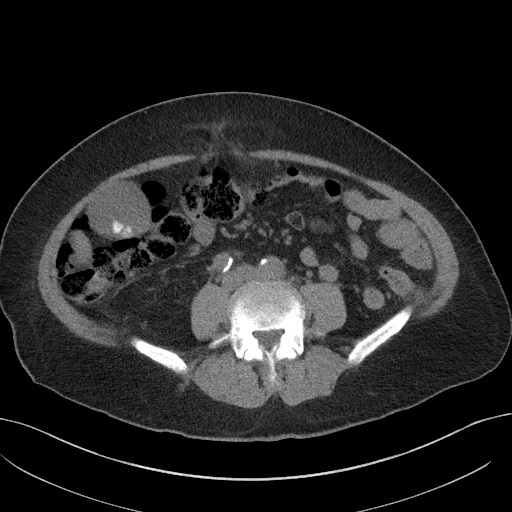
[im 51/88  soft-tissue]
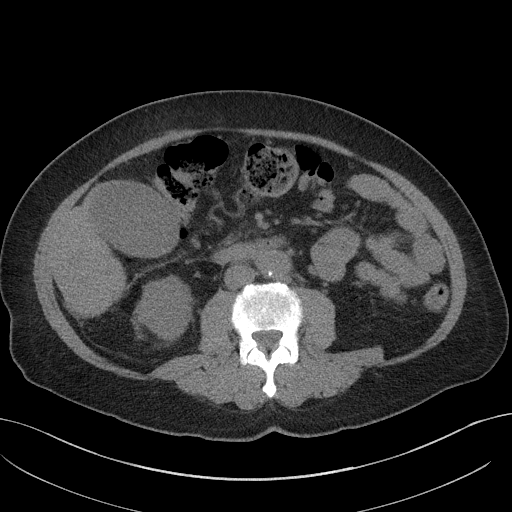
[im 59/88  soft-tissue]
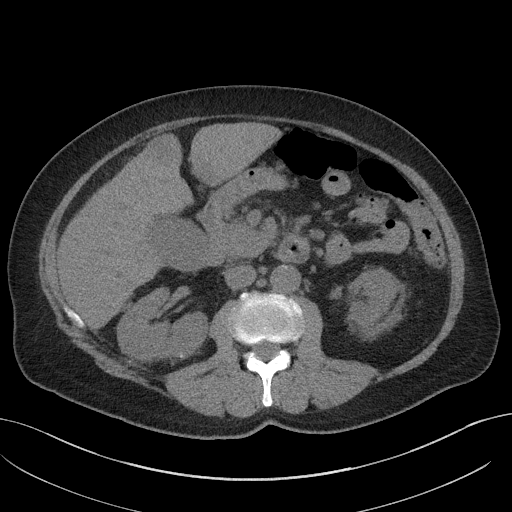
[im 59/88  bone]
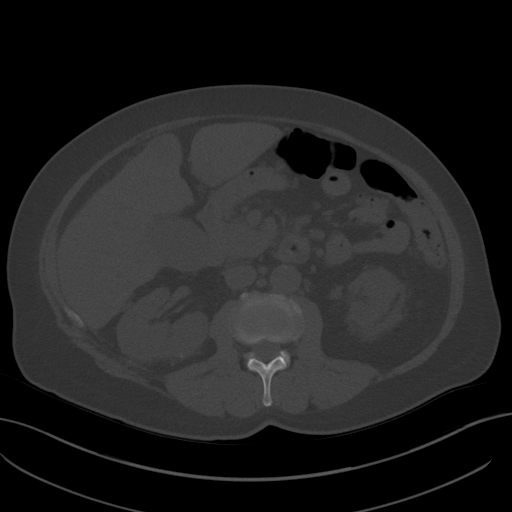
[im 62/88  soft-tissue]
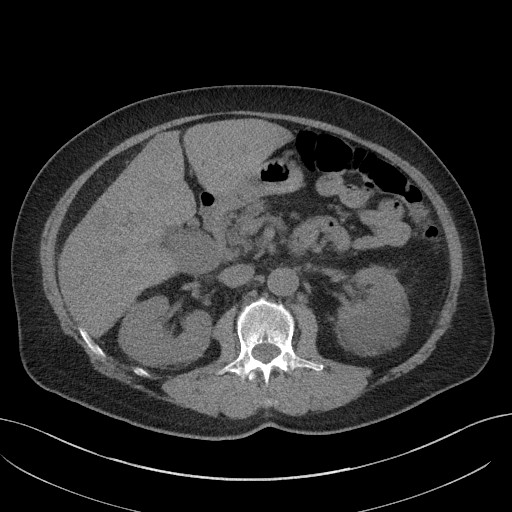
[im 69/88  soft-tissue]
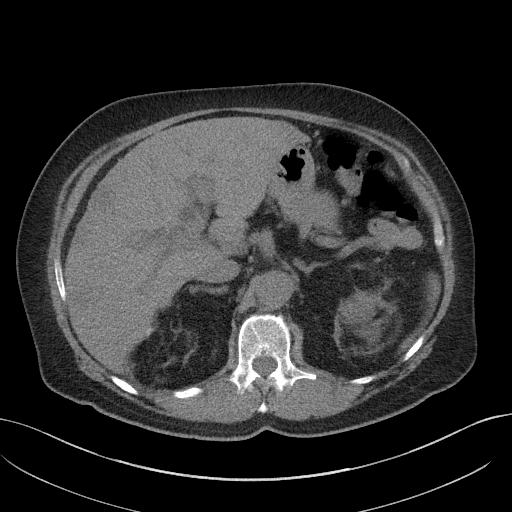
[im 77/88  soft-tissue]
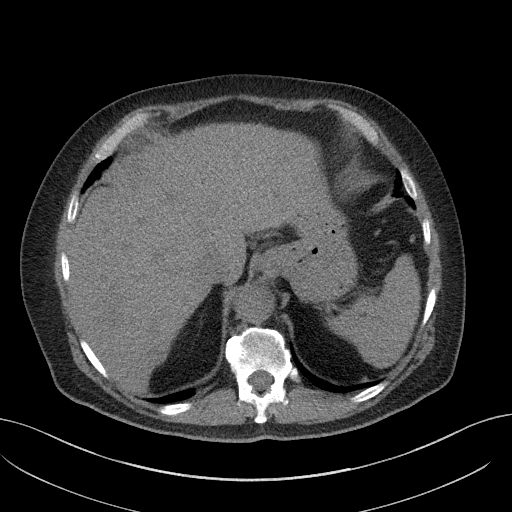
[im 84/88  soft-tissue]
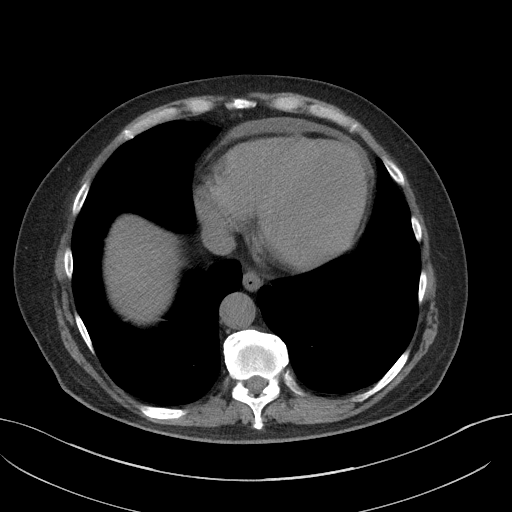

[Series 6: a/p w/o cor · coronal · non-contrast · 0.86mm/px · 3 of 150 slices shown]
[im 50/150  soft-tissue]
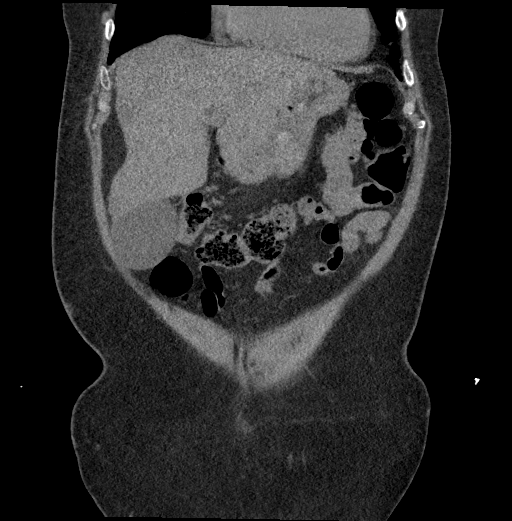
[im 67/150  soft-tissue]
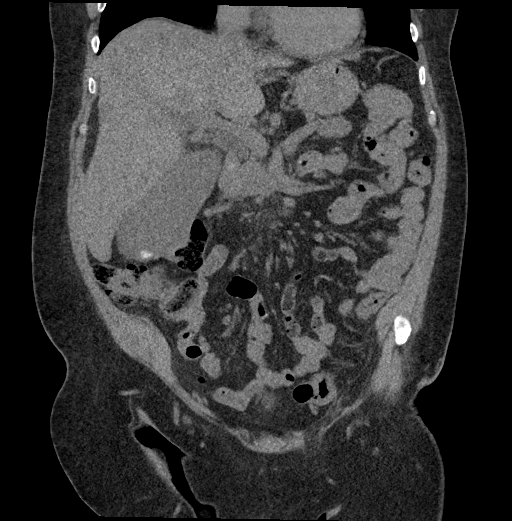
[im 83/150  soft-tissue]
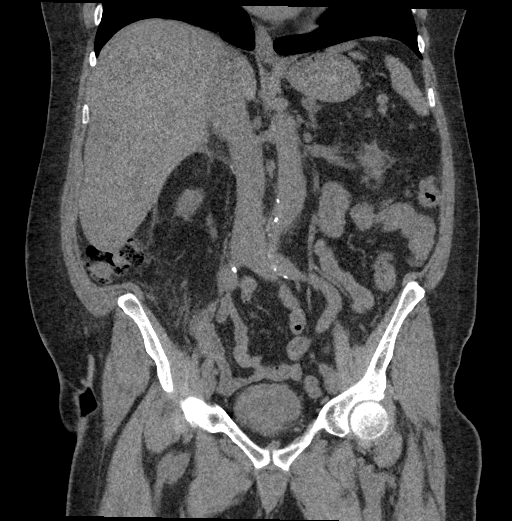

[16 of 46 positions shown; findings below may reference images not displayed]

FINDINGS: Lower chest: Linear scarring in the right lower lobe. Small
pericardial effusion.

Hepatobiliary: There are three questionable new hypodense lesions
within the peripheral liver, measuring up to 2.9 cm. Unchanged
cholelithiasis with new mild gallbladder distention. No gallbladder
wall thickening. New common bile duct dilatation, measuring 1.4 cm
in diameter, and mild central intrahepatic biliary dilatation.

Pancreas: Unremarkable. No pancreatic ductal dilatation or
surrounding inflammatory changes.

Spleen: Normal in size without focal abnormality.

Adrenals/Urinary Tract: The right adrenal gland is unremarkable.
Unchanged 1.9 cm left adrenal adenoma. Unchanged infarcted
appearance of the left kidney. Postsurgical changes related to
interval right partial nephrectomy without definite focal lesion,
although evaluation is limited without intravenous contrast. No
renal or ureteral calculi. No hydronephrosis. The bladder is
decompressed.

Stomach/Bowel: Stomach is within normal limits. Appendix appears
normal. No evidence of bowel wall thickening, distention, or
inflammatory changes.

Vascular/Lymphatic: Aortic atherosclerosis. No enlarged abdominal or
pelvic lymph nodes.

Reproductive: Mild prostatomegaly, unchanged.

Other: No free fluid or pneumoperitoneum. Unchanged fat containing
supraumbilical ventral hernia and umbilical hernia.

Musculoskeletal: No acute or significant osseous findings.
IMPRESSION: 1. Unchanged cholelithiasis with new mild gallbladder distention and
intra- and extrahepatic biliary dilatation. No definite obstructing
stone or lesion. Recommend correlation with LFTs and either ERCP or
MRCP for further evaluation.
2. Three questionable new hypodensities within the peripheral liver
measuring up to 2.9 cm, incompletely evaluated without intravenous
contrast. Recommend right upper quadrant ultrasound to confirm these
findings prior to potential further evaluation with MRI.
3. Postsurgical changes related to interval right partial
nephrectomy. Evaluation for residual or recurrent tumor is limited
without intravenous contrast.
4. Unchanged left renal infarction.
5. Small pericardial effusion.
6.  Aortic atherosclerosis ([id number]).
